# Patient Record
Sex: Female | Born: 1940
Health system: Southern US, Community
[De-identification: ages and names within clinical notes are randomized; demographics above are authoritative.]

## PROBLEM LIST (undated history)

## (undated) DIAGNOSIS — I071 Rheumatic tricuspid insufficiency: Secondary | ICD-10-CM

## (undated) DIAGNOSIS — E785 Hyperlipidemia, unspecified: Secondary | ICD-10-CM

## (undated) DIAGNOSIS — E039 Hypothyroidism, unspecified: Secondary | ICD-10-CM

## (undated) DIAGNOSIS — I340 Nonrheumatic mitral (valve) insufficiency: Secondary | ICD-10-CM

## (undated) DIAGNOSIS — I4819 Other persistent atrial fibrillation: Secondary | ICD-10-CM

## (undated) DIAGNOSIS — I351 Nonrheumatic aortic (valve) insufficiency: Secondary | ICD-10-CM

## (undated) DIAGNOSIS — Z9289 Personal history of other medical treatment: Secondary | ICD-10-CM

## (undated) DIAGNOSIS — I10 Essential (primary) hypertension: Secondary | ICD-10-CM

## (undated) DIAGNOSIS — E079 Disorder of thyroid, unspecified: Secondary | ICD-10-CM

## (undated) HISTORY — DX: Nonrheumatic aortic (valve) insufficiency: I35.1

## (undated) HISTORY — DX: Other persistent atrial fibrillation: I48.19

## (undated) HISTORY — DX: Essential (primary) hypertension: I10

## (undated) HISTORY — DX: Nonrheumatic mitral (valve) insufficiency: I34.0

## (undated) HISTORY — DX: Personal history of other medical treatment: Z92.89

## (undated) HISTORY — DX: Hypothyroidism, unspecified: E03.9

## (undated) HISTORY — PX: BREAST SURGERY: SHX581

## (undated) HISTORY — DX: Disorder of thyroid, unspecified: E07.9

## (undated) HISTORY — DX: Hyperlipidemia, unspecified: E78.5

## (undated) HISTORY — DX: Rheumatic tricuspid insufficiency: I07.1

---

## 1998-04-08 ENCOUNTER — Other Ambulatory Visit: Admission: RE | Admit: 1998-04-08 | Discharge: 1998-04-08 | Payer: Self-pay | Admitting: *Deleted

## 1999-04-14 ENCOUNTER — Other Ambulatory Visit: Admission: RE | Admit: 1999-04-14 | Discharge: 1999-04-14 | Payer: Self-pay | Admitting: *Deleted

## 2000-04-17 ENCOUNTER — Other Ambulatory Visit: Admission: RE | Admit: 2000-04-17 | Discharge: 2000-04-17 | Payer: Self-pay | Admitting: *Deleted

## 2001-04-17 ENCOUNTER — Other Ambulatory Visit: Admission: RE | Admit: 2001-04-17 | Discharge: 2001-04-17 | Payer: Self-pay | Admitting: *Deleted

## 2001-09-16 ENCOUNTER — Ambulatory Visit (HOSPITAL_COMMUNITY): Admission: RE | Admit: 2001-09-16 | Discharge: 2001-09-16 | Payer: Self-pay | Admitting: Family Medicine

## 2001-09-16 ENCOUNTER — Encounter: Payer: Self-pay | Admitting: Family Medicine

## 2001-10-10 DIAGNOSIS — E079 Disorder of thyroid, unspecified: Secondary | ICD-10-CM

## 2001-10-10 HISTORY — DX: Disorder of thyroid, unspecified: E07.9

## 2001-10-10 HISTORY — PX: THYROIDECTOMY: SHX17

## 2001-12-04 HISTORY — PX: CARDIAC CATHETERIZATION: SHX172

## 2002-03-24 ENCOUNTER — Inpatient Hospital Stay (HOSPITAL_COMMUNITY): Admission: AD | Admit: 2002-03-24 | Discharge: 2002-03-25 | Payer: Self-pay | Admitting: Family Medicine

## 2002-03-24 ENCOUNTER — Encounter: Payer: Self-pay | Admitting: Family Medicine

## 2002-11-09 ENCOUNTER — Emergency Department (HOSPITAL_COMMUNITY): Admission: EM | Admit: 2002-11-09 | Discharge: 2002-11-09 | Payer: Self-pay | Admitting: *Deleted

## 2003-04-29 ENCOUNTER — Encounter: Payer: Self-pay | Admitting: *Deleted

## 2003-04-29 ENCOUNTER — Ambulatory Visit (HOSPITAL_COMMUNITY): Admission: RE | Admit: 2003-04-29 | Discharge: 2003-04-29 | Payer: Self-pay | Admitting: *Deleted

## 2003-05-06 ENCOUNTER — Ambulatory Visit (HOSPITAL_COMMUNITY): Admission: RE | Admit: 2003-05-06 | Discharge: 2003-05-06 | Payer: Self-pay | Admitting: General Surgery

## 2003-11-23 ENCOUNTER — Ambulatory Visit (HOSPITAL_COMMUNITY): Admission: RE | Admit: 2003-11-23 | Discharge: 2003-11-23 | Payer: Self-pay | Admitting: *Deleted

## 2007-02-28 ENCOUNTER — Emergency Department (HOSPITAL_COMMUNITY): Admission: EM | Admit: 2007-02-28 | Discharge: 2007-03-01 | Payer: Self-pay | Admitting: Emergency Medicine

## 2007-03-27 ENCOUNTER — Ambulatory Visit (HOSPITAL_COMMUNITY): Admission: RE | Admit: 2007-03-27 | Discharge: 2007-03-27 | Payer: Self-pay | Admitting: Family Medicine

## 2007-05-13 ENCOUNTER — Emergency Department (HOSPITAL_COMMUNITY): Admission: EM | Admit: 2007-05-13 | Discharge: 2007-05-13 | Payer: Self-pay | Admitting: Emergency Medicine

## 2007-05-23 ENCOUNTER — Ambulatory Visit: Payer: Self-pay | Admitting: Orthopedic Surgery

## 2007-05-27 ENCOUNTER — Encounter (HOSPITAL_COMMUNITY): Admission: RE | Admit: 2007-05-27 | Discharge: 2007-06-26 | Payer: Self-pay | Admitting: Orthopedic Surgery

## 2007-06-27 ENCOUNTER — Ambulatory Visit: Payer: Self-pay | Admitting: Orthopedic Surgery

## 2009-03-08 ENCOUNTER — Ambulatory Visit (HOSPITAL_COMMUNITY): Admission: RE | Admit: 2009-03-08 | Discharge: 2009-03-08 | Payer: Self-pay | Admitting: Family Medicine

## 2009-09-17 ENCOUNTER — Ambulatory Visit (HOSPITAL_COMMUNITY): Admission: RE | Admit: 2009-09-17 | Discharge: 2009-09-17 | Payer: Self-pay | Admitting: Family Medicine

## 2009-09-17 ENCOUNTER — Encounter: Payer: Self-pay | Admitting: Family Medicine

## 2010-10-18 ENCOUNTER — Ambulatory Visit (HOSPITAL_COMMUNITY): Admission: RE | Admit: 2010-10-18 | Discharge: 2010-10-18 | Payer: Self-pay | Admitting: Family Medicine

## 2010-11-02 ENCOUNTER — Ambulatory Visit (HOSPITAL_COMMUNITY): Admission: RE | Admit: 2010-11-02 | Discharge: 2010-11-02 | Payer: Self-pay | Admitting: Family Medicine

## 2011-02-02 HISTORY — PX: TRANSTHORACIC ECHOCARDIOGRAM: SHX275

## 2011-04-21 NOTE — H&P (Signed)
   NAME:  Kathleen Mcconnell, Kathleen Mcconnell NO.:  [id number]   MEDICAL RECORD NO.:  [id number]                  PATIENT TYPE:   LOCATION:                                       FACILITY:   PHYSICIAN:  Dalia Heading, M.D.               DATE OF BIRTH:  [birth year]   DATE OF ADMISSION:  DATE OF DISCHARGE:                                HISTORY & PHYSICAL   CHIEF COMPLAINT:  Need for screening colonoscopy.   HISTORY OF PRESENT ILLNESS:  The patient is a 70 year old white female who  is referred for evaluation and treatment for a screening colonoscopy.  She  denies any abdominal complaints.  No hematochezia has been noted.  She has  never had a colonoscopy.   There is no immediate family history of colon cancer.   PAST MEDICAL HISTORY:  Past medical history includes hypertension,  hypothyroidism.   PAST SURGICAL HISTORY:  Thyroidectomy in the past.   CURRENT MEDICATIONS:  Levoxyl, Avapro, Lopressor, Fosamax.   ALLERGIES:  Aspirin.   REVIEW OF SYSTEMS:  Noncontributory.   PHYSICAL EXAMINATION:  GENERAL:  On physical examination, the patient is a  well-developed well-nourished white female in no acute distress.  VITAL SIGNS:  She is afebrile and vital signs are stable.  LUNGS:  Clear to auscultation with equal breath sounds bilaterally.  HEART:  Heart examination reveals a regular rate and rhythm without S3, S4,  or murmurs.  ABDOMEN:  The abdomen is benign.  RECTAL:  Examination is deferred to the procedure.   IMPRESSION:  Need for screening colonoscopy.    PLAN:  The patient is scheduled for a colonoscopy on May 06, 2003.  The  risks and benefits of the procedure including bleeding and perforation were  fully explained to the patient, who gave informed consent.                                               Dalia Heading, M.D.    MAJ/MEDQ  D:  04/30/2003  T:  04/30/2003  Job:  [id number]   cc:   Langley Gauss, M.D.  [address], [address]  [place]  [place] [address]  Fax: [phone]   Corrie Mckusick, M.D.  [phone]  [place]  [place] [address]  Fax: [phone]

## 2011-04-21 NOTE — Discharge Summary (Signed)
[facility]  Patient:    Kathleen Mcconnell, Kathleen Mcconnell Visit Number: [id number] MRN: [id number]          Service Type: MED Location: 2A A223 01 Attending Physician:  Darlin Priestly Dictated by:   Colette Ribas, M.D. Admit Date:  03/24/2002 Discharge Date: 03/25/2002                             Discharge Summary  ADMITTING DIAGNOSES: 1. Palpitations. 2. New onset of atrial fibrillation.  DISCHARGE DIAGNOSIS:  Resolved atrial fibrillation.  HISTORY OF PRESENT ILLNESS AND PAST MEDICAL HISTORY:  Please see admission H&P.  HOSPITAL COURSE:  A 69 year old female with hypertension and hypothyroidism who presented with palpitations and atrial flutter in the office. She was admitted for conversion which she self converted. She was admitted for also rule out of myocardial damage, echocardiogram, thyroid panel. Dr. Domingo Sep was consulted.  The day after admission, the patient felt quite well with no palpitation and no chest pain. CPKs, troponins and MBs were all negative x3. T4 10.1, TSH 1.34, T3 168. Dr. Domingo Sep felt like the patient should be on Coumadin due to the fact that she has been most likely back and forth into atrial fibrillation.  See progress note from day of discharge for physical, and please see Dr. Ledora Bottcher consult note for further details.  DISCHARGE MEDICATIONS: 1. Coumadin 5 mg daily. 2. Lopressor 12.5 p.o. b.i.d. 3. Avapro 150 mg daily. 4. Levoxyl 88 mcg daily.  FOLLOWUP:  She was to follow with [facility] with myself on Friday April 25th, and at that time we will have her INR checked. Will follow up sooner if any problems arise. Has a followup with Dr. Domingo Sep on April 02, 2002. Will have Cardiolite stress test as an outpatient. Dictated by:   Colette Ribas, M.D. Attending Physician:  Darlin Priestly DD:  04/15/02 TD:  04/16/02 Job: [id number]

## 2011-04-21 NOTE — H&P (Signed)
[facility]  Patient:    Kathleen Mcconnell, Kathleen Mcconnell Visit Number: [id number] MRN: [id number]          Service Type: MED Location: 2A A223 01 Attending Physician:  Darlin Priestly Dictated by:   Colette Ribas, M.D. Admit Date:  03/24/2002                           History and Physical  ADMISSION DIAGNOSIS:  Palpitations, new-onset atrial flutter.  HISTORY OF PRESENT ILLNESS:  The patient is a 70 year old female who presented to the office with 45 minutes of heart palpitations and "flutter."  This had happened prior, but she had never been able to come to the physicians office when this happened.  In the past this lasted anywhere from 5-10 minutes.  At this point it seems to be one of the longest episodes.  In the office she was quite stable.  Blood pressure and vital signs were all stable.  ECG showed atrial flutter with rate of around 110.  She had no associated chest pain, just some mild shortness of breath.  No nausea, vomiting, or diaphoresis.  She had no radiation of any pain either.  No other real symptomatology.  Recent thyroidectomy and some recent changes in her thyroid medications and is, basically, due for a thyroid panel.  No other symptomatology.  GI, respiratory, and GU review of systems all negative.  PAST MEDICAL HISTORY: 1. Recent thyroidectomy for thyroid mass and hypothyroidism. 2. Seasonal allergies. 3. Hypertension.  PAST SURGICAL HISTORY:  As stated above.  MEDICATIONS: 1. Levoxyl 88 mcg q.d. 2. Avapro 150 q.d.  ALLERGIES:  CLARITIN.  SOCIAL HISTORY:  Does not drink nor smoke.  No substance abuse.  FAMILY HISTORY:  Significant for ovarian carcinoma in grandmother and some coronary disease in her father.  Otherwise, no real early coronary disease or CVAs.  No diabetes.  PHYSICAL EXAMINATION:  VITAL SIGNS:  Afebrile.  Pulse in the office was 110.  Respirations 18, blood pressure 120/85.  Weight 115 pounds.  GENERAL:   Pleasant, talkative female really in no acute distress.  HEENT:  Normocephalic, atraumatic.  Pupils equal and reactive.  Extraocular muscles intact.  Nasopharynx and oropharynx clear.  NECK:  No JVD.  LUNGS:  Clear to auscultation bilaterally.  CARDIOVASCULAR:  Really appears, by auscultation, sounds almost regular rate and rhythm with ectopy.  If it is irregular, it is a quite regular irregular. There are normal S1, S2.  No murmurs auscultated.  ABDOMEN:  Bowel sounds positive.  Soft, nontender, nondistended.  EXTREMITIES:  No cyanosis, clubbing, or erythema.  No edema.  ASSESSMENT:  The patient is a 70 year old female with hypertension and hypothyroidism who presents with palpitations and atrial flutter in the office.  PLAN: 1. Admit for rule out of any myocardial damage. 2. Echocardiogram. 3. Check thyroid panel. 4. Hold off on anticoagulation, as we know exactly when it began, for right    now and see if we can convert her with digoxin. 5. Consult Dr. Domingo Sep, [facility]. 6. Will continue to follow closely. Dictated by:   Colette Ribas, M.D. Attending Physician:  Darlin Priestly DD:  03/25/02 TD:  03/25/02 Job: [id number]

## 2011-04-21 NOTE — Procedures (Signed)
[facility]  Patient:    Kathleen Mcconnell, Kathleen Mcconnell Visit Number: [id number] MRN: [id number]          Service Type: MED Location: 2A A223 01 Attending Physician:  Darlin Priestly Dictated by:   Kari Baars, M.D. Proc. Date: 03/24/02 Admit Date:  03/24/2002                            EKG Interpretations  TIME:  11:09  INTERPRETATION:  The rhythm is a sinus rhythm with a rate of about 90. P-R interval is short, which could be seen with something like Wolff-Parkinson-White syndrome.  There is no definitive slurring of the first part of the QRS upstroke, so clinical correlation of this is suggested.  There is right atrial enlargement.  QRS voltage is generally high.  There is ST depression most marked inferiorly, which is nonspecific, but may be related to the high QRS voltage and clinical correlation is again suggested.  IMPRESSION:  Abnormal electrocardiogram. Dictated by:   Kari Baars, M.D. Attending Physician:  Darlin Priestly DD:  03/24/02 TD:  03/25/02 Job: [id number]

## 2011-04-21 NOTE — Cardiovascular Report (Signed)
NAME:  Kathleen Mcconnell, Kathleen Mcconnell                          ACCOUNT NO.:  [id number]   MEDICAL RECORD NO.:  [id number]                   PATIENT TYPE:  AMB   LOCATION:  ENDO                                 FACILITY:  [facility]   PHYSICIAN:  Dani Gobble, MD                    DATE OF BIRTH:  02/08/1941   DATE OF PROCEDURE:  11/23/2003  DATE OF DISCHARGE:                              CARDIAC CATHETERIZATION   PROCEDURE:  Transesophageal echocardiogram with elective cardioversion.   INDICATION:  Atrial fibrillation.   COMPLICATIONS:  None immediate.   On arrival to the outpatient center, labs were checked and INR was found to  subtherapeutic at 1.7.  She was started on IV heparin 4000 unit bolus  followed by 1000 units per hour.  The patient was sedated and intubated  without difficulty with Versed 4 mg IV and fentanyl 100 mcg IV.   RESULTS:  1. Left atrium-mild to moderate left atrial enlargement with positive     spontaneous smoke noted. No clots or masses are appreciated.  2. Left atrial appendage is normal in size without clots or masses noted.     Exit velocities are decreased and are less than or equal to 20 cm per     second.  There is smoke noted in the left atrial appendage as well.  No     obvious clots or masses noted.  3. Right atrium reveals mild to moderate right atrial enlargement. No clots     or masses are appreciated.  4. Interatrial septum appears to be intact with negative contrast/bubble     study.  5. Left ventricular is normal in size with normal left ventricular wall     thickness.  6. Left ventricular systolic function is normal and no regional wall motion     abnormalities are noted.  7. The right ventricle normal in size with normal right ventricular systolic     function.  8. Aortic valve is trileaflet and pliable with normal leaflet excursion and     trivial eccentric aortic insufficiency.  9. The mitral valve is minimally myxomatous particularly at the distal  portion of the anterior leaflet with two mild jets of mitral     regurgitation noted.  10.      Tricuspid valve appears structurally normal with moderate to severe     (more on the moderate side) tricuspid regurgitation with eccentric jets     skirting along the interatrial septum.  11.      The pulmonic valve was not well seen, but appears grossly normal     without significant pulmonic insufficiency noted.  12.      The PA size is normal.  13.      The aorta is normal in size with no significant atheroma.   As no clots or masses were appreciated during this study, anesthesia was  called to manage anesthesia in  the airway for this patient during elective  cardioversion.  The patient was attached to the Zoll defibrillator pads.  2.5 mg of IV Lopressor was given prior to cardioversion as she did not take  her beta blocker this morning. This was repeated x1 after the procedure as  well.  Initial shock of 50 joules with the Zoll biphasic defibrillator  failed to convert to sinus rhythm, but repeat stimulation at 75 joules was  successful in converting to sinus rhythm.  She will be given an  additional 50 mg of p.o. Lopressor once fully awake and safe to do so.  We  will have her take Coumadin at 5 mg today, tomorrow and Wednesday and  recheck an INR on Wednesday.  The patient was awake and alert status post  procedure without obvious complications and I am enroute to speak to the  family from here.                                               Dani Gobble, MD    AB/MEDQ  D:  11/23/2003  T:  11/23/2003  Job:  [id number]   cc:   Corrie Mckusick, M.D.  [address]  [place]  [place] [address]  Fax: [phone]

## 2011-08-30 ENCOUNTER — Other Ambulatory Visit (HOSPITAL_COMMUNITY): Payer: Self-pay | Admitting: Family Medicine

## 2011-08-30 DIAGNOSIS — Z139 Encounter for screening, unspecified: Secondary | ICD-10-CM

## 2011-09-19 ENCOUNTER — Ambulatory Visit (HOSPITAL_COMMUNITY)
Admission: RE | Admit: 2011-09-19 | Discharge: 2011-09-19 | Disposition: A | Discharge status: Home / Self Care | Payer: Medicare Other | Source: Ambulatory Visit | Attending: Family Medicine | Admitting: Family Medicine

## 2011-09-19 DIAGNOSIS — Z139 Encounter for screening, unspecified: Secondary | ICD-10-CM

## 2011-09-19 DIAGNOSIS — M818 Other osteoporosis without current pathological fracture: Secondary | ICD-10-CM | POA: Insufficient documentation

## 2011-09-19 DIAGNOSIS — Z780 Asymptomatic menopausal state: Secondary | ICD-10-CM | POA: Insufficient documentation

## 2011-12-12 DIAGNOSIS — I4891 Unspecified atrial fibrillation: Secondary | ICD-10-CM | POA: Diagnosis not present

## 2012-01-05 ENCOUNTER — Other Ambulatory Visit (HOSPITAL_COMMUNITY): Payer: Self-pay | Admitting: Family Medicine

## 2012-01-05 DIAGNOSIS — Z139 Encounter for screening, unspecified: Secondary | ICD-10-CM

## 2012-01-11 DIAGNOSIS — E782 Mixed hyperlipidemia: Secondary | ICD-10-CM | POA: Diagnosis not present

## 2012-01-11 DIAGNOSIS — Q249 Congenital malformation of heart, unspecified: Secondary | ICD-10-CM | POA: Diagnosis not present

## 2012-01-11 DIAGNOSIS — I4891 Unspecified atrial fibrillation: Secondary | ICD-10-CM | POA: Diagnosis not present

## 2012-01-11 DIAGNOSIS — I10 Essential (primary) hypertension: Secondary | ICD-10-CM | POA: Diagnosis not present

## 2012-01-18 DIAGNOSIS — E782 Mixed hyperlipidemia: Secondary | ICD-10-CM | POA: Diagnosis not present

## 2012-01-25 DIAGNOSIS — I4891 Unspecified atrial fibrillation: Secondary | ICD-10-CM | POA: Diagnosis not present

## 2012-02-05 ENCOUNTER — Ambulatory Visit (HOSPITAL_COMMUNITY)
Admission: RE | Admit: 2012-02-05 | Discharge: 2012-02-05 | Disposition: A | Discharge status: Home / Self Care | Payer: Medicare Other | Source: Ambulatory Visit | Attending: Family Medicine | Admitting: Family Medicine

## 2012-02-05 DIAGNOSIS — Z978 Presence of other specified devices: Secondary | ICD-10-CM | POA: Diagnosis not present

## 2012-02-05 DIAGNOSIS — Z139 Encounter for screening, unspecified: Secondary | ICD-10-CM

## 2012-02-05 DIAGNOSIS — Z1231 Encounter for screening mammogram for malignant neoplasm of breast: Secondary | ICD-10-CM | POA: Insufficient documentation

## 2012-02-22 DIAGNOSIS — I4891 Unspecified atrial fibrillation: Secondary | ICD-10-CM | POA: Diagnosis not present

## 2012-03-04 DIAGNOSIS — Z9289 Personal history of other medical treatment: Secondary | ICD-10-CM

## 2012-03-04 HISTORY — DX: Personal history of other medical treatment: Z92.89

## 2012-03-05 DIAGNOSIS — L821 Other seborrheic keratosis: Secondary | ICD-10-CM | POA: Diagnosis not present

## 2012-03-05 DIAGNOSIS — D239 Other benign neoplasm of skin, unspecified: Secondary | ICD-10-CM | POA: Diagnosis not present

## 2012-03-15 DIAGNOSIS — E782 Mixed hyperlipidemia: Secondary | ICD-10-CM | POA: Diagnosis not present

## 2012-03-15 DIAGNOSIS — I4891 Unspecified atrial fibrillation: Secondary | ICD-10-CM | POA: Diagnosis not present

## 2012-03-15 DIAGNOSIS — I10 Essential (primary) hypertension: Secondary | ICD-10-CM | POA: Diagnosis not present

## 2012-03-15 DIAGNOSIS — R9431 Abnormal electrocardiogram [ECG] [EKG]: Secondary | ICD-10-CM | POA: Diagnosis not present

## 2012-03-21 DIAGNOSIS — I4891 Unspecified atrial fibrillation: Secondary | ICD-10-CM | POA: Diagnosis not present

## 2012-03-25 DIAGNOSIS — Z961 Presence of intraocular lens: Secondary | ICD-10-CM | POA: Diagnosis not present

## 2012-04-18 DIAGNOSIS — I4891 Unspecified atrial fibrillation: Secondary | ICD-10-CM | POA: Diagnosis not present

## 2012-05-15 DIAGNOSIS — I4891 Unspecified atrial fibrillation: Secondary | ICD-10-CM | POA: Diagnosis not present

## 2012-06-03 ENCOUNTER — Telehealth: Payer: Self-pay | Admitting: *Deleted

## 2012-06-03 NOTE — Telephone Encounter (Signed)
LMTCB concerning refill request for Crestor. She has not been seen by Dr. Mariah Milling needs to set up appointment.

## 2012-06-13 DIAGNOSIS — I4891 Unspecified atrial fibrillation: Secondary | ICD-10-CM | POA: Diagnosis not present

## 2012-07-16 DIAGNOSIS — I4891 Unspecified atrial fibrillation: Secondary | ICD-10-CM | POA: Diagnosis not present

## 2012-08-13 DIAGNOSIS — I4891 Unspecified atrial fibrillation: Secondary | ICD-10-CM | POA: Diagnosis not present

## 2012-09-04 DIAGNOSIS — Z23 Encounter for immunization: Secondary | ICD-10-CM | POA: Diagnosis not present

## 2012-09-10 DIAGNOSIS — I4891 Unspecified atrial fibrillation: Secondary | ICD-10-CM | POA: Diagnosis not present

## 2012-10-08 DIAGNOSIS — I4891 Unspecified atrial fibrillation: Secondary | ICD-10-CM | POA: Diagnosis not present

## 2012-11-05 DIAGNOSIS — I4891 Unspecified atrial fibrillation: Secondary | ICD-10-CM | POA: Diagnosis not present

## 2012-11-28 DIAGNOSIS — I4891 Unspecified atrial fibrillation: Secondary | ICD-10-CM | POA: Diagnosis not present

## 2012-11-28 DIAGNOSIS — E039 Hypothyroidism, unspecified: Secondary | ICD-10-CM | POA: Diagnosis not present

## 2012-12-06 ENCOUNTER — Encounter: Payer: Self-pay | Admitting: Gastroenterology

## 2012-12-09 DIAGNOSIS — I4891 Unspecified atrial fibrillation: Secondary | ICD-10-CM | POA: Diagnosis not present

## 2012-12-19 ENCOUNTER — Encounter: Payer: Self-pay | Admitting: *Deleted

## 2012-12-20 ENCOUNTER — Encounter: Payer: Self-pay | Admitting: Gastroenterology

## 2012-12-20 ENCOUNTER — Ambulatory Visit (INDEPENDENT_AMBULATORY_CARE_PROVIDER_SITE_OTHER): Payer: Medicare Other | Admitting: Gastroenterology

## 2012-12-20 VITALS — BP 110/62 | HR 76 | Ht 62.75 in | Wt 120.4 lb

## 2012-12-20 DIAGNOSIS — Z1211 Encounter for screening for malignant neoplasm of colon: Secondary | ICD-10-CM | POA: Diagnosis not present

## 2012-12-20 MED ORDER — MOVIPREP 100 G PO SOLR: 1.0000 | Freq: Once | ORAL | Status: AC

## 2012-12-20 NOTE — Progress Notes (Signed)
12/20/2012 Kathleen Mcconnell [id number] 06-13-1941   HISTORY OF PRESENT ILLNESS:  Patient is a pleasant 72 year old female who presents to our office today at the request of her PCP to schedule a screening colonoscopy.  She had a colonoscopy 10 years ago in [place], [place] at which time she did not have any polyps, according to her report.  She does not have any complaints at this time.  She is on coumadin for several years due to atrial fibrillation (cannot be switched to ASA because she is allergic to it).  Recent CBC, CMP, and TSH were WNL's.   Past Medical History  Diagnosis Date  . Hypertension   . Arrhythmia     History of  . Atrial fibrillation   . Thyroid mass 10/10/2001    removed   Past Surgical History  Procedure Date  . Thyroidectomy 10/10/2001    reports that she has never smoked. She has never used smokeless tobacco. She reports that she does not drink alcohol or use illicit drugs. family history includes Heart disease in her brother, father, and sister. Allergies  Allergen Reactions  . Aspirin       Outpatient Encounter Prescriptions as of 12/20/2012  Medication Sig Dispense Refill  . alendronate (FOSAMAX) 70 MG tablet Take 70 mg by mouth every 7 (seven) days. Take with a full glass of water on an empty stomach.      . diltiazem (CARDIZEM CD) 300 MG 24 hr capsule Take 300 mg by mouth daily.      Marland Kitchen levothyroxine (SYNTHROID, LEVOTHROID) 88 MCG tablet Take 88 mcg by mouth daily.      Marland Kitchen lisinopril (PRINIVIL,ZESTRIL) 20 MG tablet Take 20 mg by mouth 2 (two) times daily.       Marland Kitchen losartan (COZAAR) 100 MG tablet Take 100 mg by mouth daily. Take 1 tab      . rosuvastatin (CRESTOR) 5 MG tablet Take 5 mg by mouth daily.      . sotalol (BETAPACE) 80 MG tablet Take 80 mg by mouth 2 (two) times daily.      Marland Kitchen warfarin (COUMADIN) 6 MG tablet Take 6 mg by mouth daily.      . [DISCONTINUED] warfarin (COUMADIN) 6 MG tablet Take 6 mg by mouth as directed.       . [DISCONTINUED] irbesartan  (AVAPRO) 300 MG tablet Take 300 mg by mouth at bedtime.         REVIEW OF SYSTEMS  : All other systems reviewed and negative except where noted in the History of Present Illness.   PHYSICAL EXAM: BP 110/62  Pulse 76  Ht 5' 2.75" (1.594 m)  Wt 120 lb 7 oz (54.63 kg)  BMI 21.50 kg/m2 General: Well developed white female in no acute distress Head: Normocephalic and atraumatic Eyes:  sclerae anicteric,conjunctive pink. Ears: Normal auditory acuity Lungs: Clear throughout to auscultation Heart: Irregularly irregular; no M/R/G. Abdomen: Soft, nontender, non distended. No masses or hepatomegaly noted. Normal bowel sounds Rectal: Deferred; will be done at the time of colonoscopy. Musculoskeletal: Symmetrical with no gross deformities  Skin: No lesions on visible extremities Extremities: No edema  Neurological: Alert oriented x 4, grossly nonfocal Psychological:  Alert and cooperative. Normal mood and affect  ASSESSMENT AND PLAN: -Screening colonoscopy:  No complaints.  On coumadin.  Will schedule colonoscopy with Dr. Juanda Chance at [facility] and contact her cardiologist for ok to stop coumadin.  Risks and benefits were discussed with the patient and her daughter, and she agrees  to proceed.

## 2012-12-20 NOTE — Progress Notes (Signed)
Reviewed and agree.

## 2012-12-20 NOTE — Patient Instructions (Addendum)
We sent a prescription for the Moviprep to [facility], [place].  You have been scheduled for a colonoscopy with propofol. Please follow written instructions given to you at your visit today.  Please pick up your prep kit at the pharmacy within the next 1-3 days. If you use inhalers (even only as needed) or a CPAP machine, please bring them with you on the day of your procedure.  We will call you once we hear from Dr. K Italy [facility] regarding the coumadin medication.

## 2012-12-24 ENCOUNTER — Telehealth: Payer: Self-pay | Admitting: *Deleted

## 2012-12-24 NOTE — Telephone Encounter (Signed)
Dr. Italy Hilty faxed to [place] his response for Coumadin clearance for her colonoscopy scheduled on 01-24-2013.  The patient can stop the coumadin on 2-16- 2014 . She can resume it on 01-25-2013.  ( the day after the procedure).  The patient understood the instructions and thanked me for calling.

## 2013-01-09 ENCOUNTER — Other Ambulatory Visit: Payer: Self-pay | Admitting: Internal Medicine

## 2013-01-09 DIAGNOSIS — I4891 Unspecified atrial fibrillation: Secondary | ICD-10-CM | POA: Diagnosis not present

## 2013-01-09 DIAGNOSIS — E782 Mixed hyperlipidemia: Secondary | ICD-10-CM | POA: Diagnosis not present

## 2013-01-09 DIAGNOSIS — I10 Essential (primary) hypertension: Secondary | ICD-10-CM | POA: Diagnosis not present

## 2013-01-13 ENCOUNTER — Encounter (HOSPITAL_COMMUNITY): Payer: Self-pay | Admitting: Pharmacy Technician

## 2013-01-14 ENCOUNTER — Ambulatory Visit (HOSPITAL_COMMUNITY)
Admission: RE | Admit: 2013-01-14 | Discharge: 2013-01-14 | Disposition: A | Discharge status: Home / Self Care | Payer: Medicare Other | Source: Ambulatory Visit | Attending: Internal Medicine | Admitting: Internal Medicine

## 2013-01-14 ENCOUNTER — Other Ambulatory Visit (HOSPITAL_COMMUNITY): Payer: Self-pay | Admitting: Internal Medicine

## 2013-01-14 DIAGNOSIS — E782 Mixed hyperlipidemia: Secondary | ICD-10-CM | POA: Diagnosis not present

## 2013-01-14 DIAGNOSIS — Z01811 Encounter for preprocedural respiratory examination: Secondary | ICD-10-CM

## 2013-01-14 DIAGNOSIS — Z79899 Other long term (current) drug therapy: Secondary | ICD-10-CM | POA: Diagnosis not present

## 2013-01-14 DIAGNOSIS — D65 Disseminated intravascular coagulation [defibrination syndrome]: Secondary | ICD-10-CM | POA: Diagnosis not present

## 2013-01-14 DIAGNOSIS — I10 Essential (primary) hypertension: Secondary | ICD-10-CM | POA: Diagnosis not present

## 2013-01-14 DIAGNOSIS — R0602 Shortness of breath: Secondary | ICD-10-CM | POA: Diagnosis not present

## 2013-01-20 ENCOUNTER — Encounter (HOSPITAL_COMMUNITY)
Admission: RE | Disposition: A | Discharge status: Home / Self Care | Payer: Self-pay | Source: Ambulatory Visit | Attending: Internal Medicine

## 2013-01-20 ENCOUNTER — Encounter (HOSPITAL_COMMUNITY): Payer: Self-pay | Admitting: Gastroenterology

## 2013-01-20 ENCOUNTER — Ambulatory Visit (HOSPITAL_COMMUNITY)
Admission: RE | Admit: 2013-01-20 | Discharge: 2013-01-20 | Disposition: A | Discharge status: Home / Self Care | Payer: Medicare Other | Source: Ambulatory Visit | Attending: Internal Medicine | Admitting: Internal Medicine

## 2013-01-20 ENCOUNTER — Ambulatory Visit (HOSPITAL_COMMUNITY): Payer: Medicare Other | Admitting: Anesthesiology

## 2013-01-20 ENCOUNTER — Encounter (HOSPITAL_COMMUNITY): Payer: Self-pay | Admitting: Anesthesiology

## 2013-01-20 DIAGNOSIS — I4891 Unspecified atrial fibrillation: Secondary | ICD-10-CM | POA: Insufficient documentation

## 2013-01-20 HISTORY — PX: CARDIOVERSION: SHX1299

## 2013-01-20 SURGERY — CARDIOVERSION
Anesthesia: Monitor Anesthesia Care

## 2013-01-20 MED ORDER — SODIUM CHLORIDE 0.9 % IV SOLN
INTRAVENOUS | Status: DC
  Administered 2013-01-20: 500 mL via INTRAVENOUS

## 2013-01-20 MED ORDER — SODIUM CHLORIDE 0.9 % IV SOLN
INTRAVENOUS | Status: DC | PRN
  Administered 2013-01-20: 13:00:00 via INTRAVENOUS

## 2013-01-20 MED ORDER — PROPOFOL 10 MG/ML IV BOLUS
INTRAVENOUS | Status: DC | PRN
  Administered 2013-01-20: 80 mg via INTRAVENOUS

## 2013-01-20 NOTE — H&P (Signed)
[facility]          INTERVAL PROCEDURE H&P   History and Physical Interval Note:  01/20/2013 12:13 PM  Kathleen Mcconnell has presented today for their planned procedure. The various methods of treatment have been discussed with the patient and family. After consideration of risks, benefits and other options for treatment, the patient has consented to the procedure.  The patients' outpatient history has been reviewed, patient examined, and no change in status from most recent office note within the past 30 days. I have reviewed the patients' chart and labs and will proceed as planned. Questions were answered to the patient's satisfaction.   Chrystie Nose, MD, [facility] Attending Cardiologist [facility]  Kathleen Mcconnell C 01/20/2013, 12:13 PM

## 2013-01-20 NOTE — Anesthesia Preprocedure Evaluation (Addendum)
Anesthesia Evaluation  Patient identified by MRN, date of birth, ID band Patient awake    Reviewed: Allergy & Precautions, H&P , NPO status , Patient's Chart, lab work & pertinent test results, reviewed documented beta blocker date and time   History of Anesthesia Complications Negative for: history of anesthetic complications  Airway Mallampati: II TM Distance: >3 FB Neck ROM: Full    Dental  (+) Teeth Intact and Dental Advisory Given   Pulmonary neg pulmonary ROS,          Cardiovascular hypertension, Pt. on medications + dysrhythmias Atrial Fibrillation Rhythm:Irregular     Neuro/Psych negative neurological ROS     GI/Hepatic negative GI ROS, Neg liver ROS,   Endo/Other  Hypothyroidism   Renal/GU negative Renal ROS     Musculoskeletal   Abdominal   Peds  Hematology   Anesthesia Other Findings   Reproductive/Obstetrics                           Anesthesia Physical Anesthesia Plan  ASA: III  Anesthesia Plan: MAC   Post-op Pain Management:    Induction: Intravenous  Airway Management Planned: Mask  Additional Equipment:   Intra-op Plan:   Post-operative Plan:   Informed Consent: I have reviewed the patients History and Physical, chart, labs and discussed the procedure including the risks, benefits and alternatives for the proposed anesthesia with the patient or authorized representative who has indicated his/her understanding and acceptance.   Dental advisory given  Plan Discussed with: Anesthesiologist and Surgeon  Anesthesia Plan Comments:        Anesthesia Quick Evaluation

## 2013-01-20 NOTE — Anesthesia Postprocedure Evaluation (Signed)
  Anesthesia Post-op Note  Patient: Kathleen Mcconnell  Procedure(s) Performed: Procedure(s): CARDIOVERSION (N/A)  Patient Location: PACU  Anesthesia Type:MAC  Level of Consciousness: awake, alert  and oriented  Airway and Oxygen Therapy: Patient Spontanous Breathing and Patient connected to nasal cannula oxygen  Post-op Pain: none  Post-op Assessment: Post-op Vital signs reviewed, Patient's Cardiovascular Status Stable, Respiratory Function Stable, Patent Airway, No signs of Nausea or vomiting and Pain level controlled  Post-op Vital Signs: Reviewed and stable  Complications: No apparent anesthesia complications

## 2013-01-20 NOTE — Discharge Instructions (Addendum)
Electrical Cardioversion  Cardioversion is the delivery of a jolt of electricity to change the rhythm of the heart. Sticky patches or metal paddles are placed on the chest to deliver the electricity from a special device. This is done to restore a normal rhythm. A rhythm that is too fast or not regular keeps the heart from pumping well. Compared to medicines used to change an abnormal rhythm, cardioversion is faster and works better. It is also unpleasant and may dislodge blood clots from the heart. WHEN WOULD THIS BE DONE?  In an emergency:  There is low or no blood pressure as a result of the heart rhythm.  Normal rhythm must be restored as fast as possible to protect the brain and heart from further damage.  It may save a life.  For less serious heart rhythms, such as atrial fibrillation or flutter, in which:  The heart is beating too fast or is not regular.  The heart is still able to pump enough blood, but not as well as it should.  Medicine to change the rhythm has not worked.  It is safe to wait in order to allow time for preparation. LET YOUR CAREGIVER KNOW ABOUT:   Every medicine you are taking. It is very important to do this! Know when to take or stop taking any of them.  Any time in the past that you have felt your heart was not beating normally. RISKS AND COMPLICATIONS   Clots may form in the chambers of the heart if it is beating too fast. These clots may be dislodged during the procedure and travel to other parts of the body.  There is risk of a stroke during and after the procedure if a clot moves. Blood thinners lower this risk.  You may have a special test of your heart (TEE) to make sure there are no clots in your heart. BEFORE THE PROCEDURE   You may have some tests to see how well your heart is working.  You may start taking blood thinners so your blood does not clot as easily.  Other drugs may be given to help your heart work better. PROCEDURE  (SCHEDULED)  The procedure is typically done in a hospital by a heart doctor (cardiologist).  You will be told when and where to go.  You may be given some medicine through an intravenous (IV) access to reduce discomfort and make you sleepy before the procedure.  Your whole body may move when the shock is delivered. Your chest may feel sore.  You may be able to go home after a few hours. Your heart rhythm will be watched to make sure it does not change. HOME CARE INSTRUCTIONS   Only take medicine as directed by your caregiver. Be sure you understand how and when to take your medicine.  Learn how to feel your pulse and check it often.  Limit your activity for 48 hours.  Avoid caffeine and other stimulants as directed. SEEK MEDICAL CARE IF:   You feel like your heart is beating too fast or your pulse is not regular.  You have any questions about your medicines.  You have bleeding that will not stop. SEEK IMMEDIATE MEDICAL CARE IF:   You are dizzy or feel faint.  It is hard to breathe or you feel short of breath.  There is a change in discomfort in your chest.  Your speech is slurred or you have trouble moving your arm or leg on one side.  You get  a muscle cramp.  Your fingers or toes turn cold or blue. MAKE SURE YOU:   Understand these instructions.  Will watch your condition.  Will get help right away if you are not doing well or get worse. Document Released: 11/10/2002 Document Revised: 02/12/2012 Document Reviewed: 03/11/2008 [facility] Patient Information 2013 [place], [place].  Monitored Anesthesia Care (MAC)   MAC stands for monitored anesthesia care. MAC usually means a tube is not put in your trachea (windpipe). MAC may also be called moderate sedation. MAC usually involves giving intravenous anesthetic drugs, oxygen, watching vital signs and standard patient monitoring procedures similar to those used during a general anesthetic. MAC can be done without going  to the operating room. MAC is typically used for small procedures that cannot be done with only local anesthesia. MAC usually means lower doses of anesthetic drugs. The recovery period tends to be shorter. The drugs used cause a lower level of awareness. This means you are partially awake and your reflexes are intact. You may hear what is being said and feel some pressure, but should not feel pain. The drugs used may affect your ability to remember the procedure. If you have depressed consciousness and lose some protective reflexes, this is called deep sedation. If you become unconscious and fall completely asleep, this is general anesthesia. In both deep sedation and general anesthesia, the caregivers must make sure that your airway remains open. During MAC, the sedation-trained caregivers will:  Give medications which may include:  Sedatives.  Analgesics.  Hypnotics.  Other medications which are needed to keep you comfortable, safe and secure.  Give local anesthetic to numb the procedural site.  Monitor your level of consciousness.  Monitor your blood pressure.  Monitor your heart rate and rhythm.  Monitor your respirations and oxygen levels.  Monitor your airway.  Monitor your level of pain.  Evaluate and treat problems which may occur. Document Released: 08/16/2005 Document Revised: 02/12/2012 Document Reviewed: 10/19/2009 [facility] Patient Information 2013 [place], [place].

## 2013-01-20 NOTE — Transfer of Care (Signed)
Immediate Anesthesia Transfer of Care Note  Patient: Kathleen Mcconnell  Procedure(s) Performed: Procedure(s): CARDIOVERSION (N/A)  Patient Location: PACU  Anesthesia Type:MAC  Level of Consciousness: awake, alert  and oriented  Airway & Oxygen Therapy: Patient Spontanous Breathing and Patient connected to nasal cannula oxygen  Post-op Assessment: Report given to PACU RN and Post -op Vital signs reviewed and stable  Post vital signs: Reviewed and stable  Complications: No apparent anesthesia complications

## 2013-01-20 NOTE — CV Procedure (Signed)
[facility]  CARDIOVERSION NOTE   Procedure: Electrical Cardioversion Indications:  Atrial Fibrillation  Procedure Details:  Consent: Risks of procedure as well as the alternatives and risks of each were explained to the (patient/caregiver).  Consent for procedure obtained.  Time Out: Verified patient identification, verified procedure, site/side was marked, verified correct patient position, special equipment/implants available, medications/allergies/relevent history reviewed, required imaging and test results available.  Performed  Patient placed on cardiac monitor, pulse oximetry, supplemental oxygen as necessary.  Sedation given: per anesthesia, see their note Pacer pads placed anterior and posterior chest.  Cardioverted 1 time(s).  Cardioverted at 150J biphasic.  Impression: Findings: Post procedure EKG shows: NSR Complications: None Patient did tolerate procedure well.  Continue current medications, including warfarin at current dose and sotalol 120 mg BID.  Discharge home once awake and alert.  Time Spent Directly with the Patient:  30 minutes   Chrystie Nose, MD, [facility] Attending Cardiologist [facility]  Juanelle Trueheart C 01/20/2013, 12:52 PM

## 2013-01-20 NOTE — Preoperative (Signed)
Beta Blockers   Reason not to administer Beta Blockers:Not Applicable 

## 2013-01-21 ENCOUNTER — Encounter (HOSPITAL_COMMUNITY): Payer: Self-pay | Admitting: Internal Medicine

## 2013-01-21 DIAGNOSIS — I4891 Unspecified atrial fibrillation: Secondary | ICD-10-CM | POA: Diagnosis not present

## 2013-01-24 ENCOUNTER — Encounter: Payer: Medicare Other | Admitting: Internal Medicine

## 2013-02-05 DIAGNOSIS — I4891 Unspecified atrial fibrillation: Secondary | ICD-10-CM | POA: Diagnosis not present

## 2013-02-07 ENCOUNTER — Ambulatory Visit: Payer: Self-pay | Admitting: Pharmacist Clinician (PhC)/ Clinical Pharmacy Specialist

## 2013-02-07 DIAGNOSIS — I4891 Unspecified atrial fibrillation: Secondary | ICD-10-CM | POA: Insufficient documentation

## 2013-02-07 DIAGNOSIS — Z7901 Long term (current) use of anticoagulants: Secondary | ICD-10-CM | POA: Insufficient documentation

## 2013-02-18 DIAGNOSIS — E782 Mixed hyperlipidemia: Secondary | ICD-10-CM | POA: Diagnosis not present

## 2013-02-18 DIAGNOSIS — I10 Essential (primary) hypertension: Secondary | ICD-10-CM | POA: Diagnosis not present

## 2013-02-18 DIAGNOSIS — R5381 Other malaise: Secondary | ICD-10-CM | POA: Diagnosis not present

## 2013-02-18 DIAGNOSIS — I4891 Unspecified atrial fibrillation: Secondary | ICD-10-CM | POA: Diagnosis not present

## 2013-02-18 DIAGNOSIS — R5383 Other fatigue: Secondary | ICD-10-CM | POA: Diagnosis not present

## 2013-02-27 ENCOUNTER — Other Ambulatory Visit: Payer: Self-pay | Admitting: *Deleted

## 2013-02-27 ENCOUNTER — Ambulatory Visit (INDEPENDENT_AMBULATORY_CARE_PROVIDER_SITE_OTHER): Payer: Medicare Other | Admitting: Internal Medicine

## 2013-02-27 ENCOUNTER — Encounter: Payer: Self-pay | Admitting: Internal Medicine

## 2013-02-27 VITALS — BP 130/90 | HR 62 | Ht 63.0 in | Wt 119.1 lb

## 2013-02-27 DIAGNOSIS — I079 Rheumatic tricuspid valve disease, unspecified: Secondary | ICD-10-CM

## 2013-02-27 DIAGNOSIS — I4891 Unspecified atrial fibrillation: Secondary | ICD-10-CM

## 2013-02-27 DIAGNOSIS — I4892 Unspecified atrial flutter: Secondary | ICD-10-CM | POA: Diagnosis not present

## 2013-02-27 DIAGNOSIS — Z7901 Long term (current) use of anticoagulants: Secondary | ICD-10-CM

## 2013-02-27 DIAGNOSIS — I10 Essential (primary) hypertension: Secondary | ICD-10-CM | POA: Insufficient documentation

## 2013-02-27 DIAGNOSIS — I071 Rheumatic tricuspid insufficiency: Secondary | ICD-10-CM

## 2013-02-27 NOTE — Patient Instructions (Addendum)
Your physician recommends that you schedule a follow-up appointment in as needed   Your physician has recommended you make the following change in your medication:  1) Stop Sotalol

## 2013-02-27 NOTE — Progress Notes (Signed)
Primary Care Physician: Colette Ribas, MD Referring Physician:  Dr Lady Deutscher Kathleen Mcconnell is a 72 y.o. female with a h/o persistent atrial fibrillation, HTN and severe TR who presents for EP consultation.  She reports that she was initially diagnosed with atrial fibrillation years ago when she presented with weakenss and palpitations.  She was placed on sotalol.  She did well without symptoms of afib until 2 months ago.  She began noticing SOB with moderate activity such as climbing stairs.  She presented to Dr Rennis Golden and was noted to be back in afib.  She was placed on coumadin and her sotalol was increased.  She underwent cardioversion but quickly returned to afib.  She reports that her afib is much less noticeable than during the past.  She does not feel significantly limited or that her quality of life is significantly impaired.   Today, she denies symptoms of palpitations, chest pain, orthopnea, PND, lower extremity edema, dizziness, presyncope, syncope, or neurologic sequela. The patient is tolerating medications without difficulties and is otherwise without complaint today.   Past Medical History  Diagnosis Date  . Hypertension   . Persistent atrial fibrillation     History of  . Severe tricuspid regurgitation   . Thyroid mass 10/10/2001    benign lesion removed  . Hypothyroidism   . Hyperlipidemia    Past Surgical History  Procedure Laterality Date  . Thyroidectomy  10/10/2001  . Cardioversion N/A 01/20/2013    Procedure: CARDIOVERSION;  Surgeon: Chrystie Nose, MD;  Location: [facility] ENDOSCOPY;  Service: Cardiovascular;  Laterality: N/A;    Current Outpatient Prescriptions  Medication Sig Dispense Refill  . acetaminophen (TYLENOL) 500 MG tablet Take 1,000 mg by mouth every 6 (six) hours as needed. For pain      . alendronate (FOSAMAX) 70 MG tablet Take 70 mg by mouth every 7 (seven) days. Take with a full glass of water on an empty stomach.      . B Complex Vitamins (B  COMPLEX 1 PO) Take 1 tablet by mouth daily.      Marland Kitchen CALCIUM-VITAMIN D PO Take 1 tablet by mouth 2 (two) times daily.      Marland Kitchen diltiazem (CARDIZEM CD) 300 MG 24 hr capsule Take 240 mg by mouth daily.       . fish oil-omega-3 fatty acids 1000 MG capsule Take 1 g by mouth daily.      Marland Kitchen levothyroxine (SYNTHROID, LEVOTHROID) 88 MCG tablet Take 88 mcg by mouth daily.      Marland Kitchen lisinopril (PRINIVIL,ZESTRIL) 20 MG tablet Take 20 mg by mouth 2 (two) times daily.       Marland Kitchen losartan (COZAAR) 100 MG tablet Take 100 mg by mouth every evening. Take 1 tab      . Multiple Vitamin (MULTIVITAMIN WITH MINERALS) TABS Take 1 tablet by mouth daily.      . rosuvastatin (CRESTOR) 5 MG tablet Take 5 mg by mouth daily.      . sotalol (BETAPACE) 120 MG tablet Take 120 mg by mouth 2 (two) times daily.      Marland Kitchen warfarin (COUMADIN) 6 MG tablet Take 6 mg by mouth daily. Takes 1 tablet every day, except Tuesday and Thursday-takes 1/2 tablet on these days.       No current facility-administered medications for this visit.    Allergies  Allergen Reactions  . Aspirin Anaphylaxis, Hives, Rash and Other (See Comments)    As well as short of breath; Previously  was rushed to hospital-given epinephrine injection.     History   Social History  . Marital Status: Married    Spouse Name: N/A    Number of Children: 2  . Years of Education: N/A   Occupational History  . retired    Social History Main Topics  . Smoking status: Never Smoker   . Smokeless tobacco: Never Used  . Alcohol Use: No  . Drug Use: No  . Sexually Active: Not on file   Other Topics Concern  . Not on file   Social History Narrative   Pt lives in [place] with spouse.  Retired Catering manager.    Family History  Problem Relation Age of Onset  . Heart disease Father     deceaced 43  . Heart disease Brother     x 3, one deceased age 45  . Heart disease Sister     ROS- All systems are reviewed and negative except as per the HPI above  Physical  Exam: Filed Vitals:   02/27/13 0951  BP: 130/90  Pulse: 62  Height: 5\' 3"  (1.6 m)  Weight: 119 lb 1.9 oz (54.032 kg)    GEN- The patient is well appearing, alert and oriented x 3 today.   Head- normocephalic, atraumatic Eyes-  Sclera clear, conjunctiva pink Ears- hearing intact Oropharynx- clear Neck- supple, no JVP Lymph- no cervical lymphadenopathy Lungs- Clear to ausculation bilaterally, normal work of breathing Heart- irregular rate and rhythm, no murmurs, rubs or gallops, PMI not laterally displaced GI- soft, NT, ND, + BS Extremities- no clubbing, cyanosis, or edema MS- no significant deformity or atrophy Skin- no rash or lesion Psych- euthymic mood, full affect Neuro- strength and sensation are intact  EKG today reveals coarse afib, V rate 62 bpm Echo 2/12 reviewed showing normal EF, severe TR  Assessment and Plan:  1. The patient has minimally symptomatic persistent afib in the setting of prior severe TR.  She has not had an echo since 2012. Therapeutic strategies for afib including medicine and ablation were discussed in detail with the patient today. Risk, benefits, and alternatives to EP study and radiofrequency ablation for afib were also discussed in detail today. She is aware that her anticipated success rates from ablation are 50-60% in the setting of structural heart disease and persistent afib with a high likelihood of repeat procedures required. She is clear that she would not like to pursue ablation at this time.  We discussed amiodarone as well as rate control according the AFFIRM trial.  She would prefer rate control at this time.  She is appropriately anticoagulated with with coumadin.  I would continue this long term. I will ask Dr Rennis Golden to repeat an echo.  If she continues to have severe valvular heart disease, then consultation with Dr Cornelius Moras for valve repair and surgical maze might be another option.  She will stop sotalol today as it has been demonstrated to  be ineffective at this point for her.  Continue cardizem for rate control. I will see her as needed going forward.  2. HTN Stable No change required today

## 2013-03-05 DIAGNOSIS — L678 Other hair color and hair shaft abnormalities: Secondary | ICD-10-CM | POA: Diagnosis not present

## 2013-03-05 DIAGNOSIS — C4360 Malignant melanoma of unspecified upper limb, including shoulder: Secondary | ICD-10-CM | POA: Diagnosis not present

## 2013-03-05 DIAGNOSIS — L570 Actinic keratosis: Secondary | ICD-10-CM | POA: Diagnosis not present

## 2013-03-05 DIAGNOSIS — L680 Hirsutism: Secondary | ICD-10-CM | POA: Diagnosis not present

## 2013-03-05 DIAGNOSIS — L738 Other specified follicular disorders: Secondary | ICD-10-CM | POA: Diagnosis not present

## 2013-03-05 DIAGNOSIS — L821 Other seborrheic keratosis: Secondary | ICD-10-CM | POA: Diagnosis not present

## 2013-03-07 DIAGNOSIS — I4891 Unspecified atrial fibrillation: Secondary | ICD-10-CM | POA: Diagnosis not present

## 2013-03-18 DIAGNOSIS — C4360 Malignant melanoma of unspecified upper limb, including shoulder: Secondary | ICD-10-CM | POA: Diagnosis not present

## 2013-04-01 DIAGNOSIS — H40019 Open angle with borderline findings, low risk, unspecified eye: Secondary | ICD-10-CM | POA: Diagnosis not present

## 2013-04-01 DIAGNOSIS — Z961 Presence of intraocular lens: Secondary | ICD-10-CM | POA: Diagnosis not present

## 2013-04-08 ENCOUNTER — Other Ambulatory Visit (HOSPITAL_COMMUNITY): Payer: Self-pay | Admitting: Internal Medicine

## 2013-04-08 DIAGNOSIS — I070 Rheumatic tricuspid stenosis: Secondary | ICD-10-CM

## 2013-04-10 DIAGNOSIS — I4891 Unspecified atrial fibrillation: Secondary | ICD-10-CM | POA: Diagnosis not present

## 2013-04-17 ENCOUNTER — Ambulatory Visit (HOSPITAL_COMMUNITY)
Admission: RE | Admit: 2013-04-17 | Discharge: 2013-04-17 | Disposition: A | Discharge status: Home / Self Care | Payer: Medicare Other | Source: Ambulatory Visit | Attending: Cardiovascular Disease | Admitting: Cardiovascular Disease

## 2013-04-17 DIAGNOSIS — I4891 Unspecified atrial fibrillation: Secondary | ICD-10-CM | POA: Insufficient documentation

## 2013-04-17 DIAGNOSIS — I517 Cardiomegaly: Secondary | ICD-10-CM | POA: Insufficient documentation

## 2013-04-17 DIAGNOSIS — I10 Essential (primary) hypertension: Secondary | ICD-10-CM | POA: Insufficient documentation

## 2013-04-17 DIAGNOSIS — E785 Hyperlipidemia, unspecified: Secondary | ICD-10-CM | POA: Insufficient documentation

## 2013-04-17 DIAGNOSIS — I059 Rheumatic mitral valve disease, unspecified: Secondary | ICD-10-CM | POA: Insufficient documentation

## 2013-04-17 DIAGNOSIS — I369 Nonrheumatic tricuspid valve disorder, unspecified: Secondary | ICD-10-CM | POA: Diagnosis not present

## 2013-04-17 DIAGNOSIS — I079 Rheumatic tricuspid valve disease, unspecified: Secondary | ICD-10-CM | POA: Insufficient documentation

## 2013-04-17 DIAGNOSIS — I359 Nonrheumatic aortic valve disorder, unspecified: Secondary | ICD-10-CM | POA: Insufficient documentation

## 2013-04-17 DIAGNOSIS — I070 Rheumatic tricuspid stenosis: Secondary | ICD-10-CM

## 2013-04-17 DIAGNOSIS — I071 Rheumatic tricuspid insufficiency: Secondary | ICD-10-CM

## 2013-04-17 NOTE — Progress Notes (Signed)
[facility]   2D echo completed 04/17/2013.   Cindy Jaymes Hang, RDCS  

## 2013-04-30 ENCOUNTER — Ambulatory Visit (INDEPENDENT_AMBULATORY_CARE_PROVIDER_SITE_OTHER): Payer: Medicare Other | Admitting: Pharmacist Clinician (PhC)/ Clinical Pharmacy Specialist

## 2013-04-30 DIAGNOSIS — I4891 Unspecified atrial fibrillation: Secondary | ICD-10-CM | POA: Diagnosis not present

## 2013-04-30 DIAGNOSIS — Z7901 Long term (current) use of anticoagulants: Secondary | ICD-10-CM | POA: Diagnosis not present

## 2013-04-30 LAB — POCT INR: INR: 2.8

## 2013-05-07 NOTE — Progress Notes (Signed)
Can't close due to provider

## 2013-05-08 ENCOUNTER — Ambulatory Visit: Payer: Medicare Other

## 2013-05-16 ENCOUNTER — Other Ambulatory Visit: Payer: Self-pay | Admitting: *Deleted

## 2013-05-16 MED ORDER — WARFARIN SODIUM 6 MG PO TABS: 6.0000 mg | ORAL_TABLET | Freq: Every day | ORAL | Status: DC

## 2013-05-16 MED ORDER — LISINOPRIL 20 MG PO TABS: 20.0000 mg | ORAL_TABLET | Freq: Two times a day (BID) | ORAL | Status: DC

## 2013-05-28 ENCOUNTER — Ambulatory Visit (INDEPENDENT_AMBULATORY_CARE_PROVIDER_SITE_OTHER): Payer: Medicare Other | Admitting: *Deleted

## 2013-05-28 DIAGNOSIS — I4891 Unspecified atrial fibrillation: Secondary | ICD-10-CM | POA: Diagnosis not present

## 2013-05-28 DIAGNOSIS — Z7901 Long term (current) use of anticoagulants: Secondary | ICD-10-CM | POA: Diagnosis not present

## 2013-05-28 LAB — POCT INR: INR: 3.3

## 2013-06-16 ENCOUNTER — Ambulatory Visit (INDEPENDENT_AMBULATORY_CARE_PROVIDER_SITE_OTHER): Payer: Medicare Other | Admitting: *Deleted

## 2013-06-16 DIAGNOSIS — I4891 Unspecified atrial fibrillation: Secondary | ICD-10-CM

## 2013-06-16 DIAGNOSIS — Z7901 Long term (current) use of anticoagulants: Secondary | ICD-10-CM | POA: Diagnosis not present

## 2013-06-16 LAB — POCT INR: INR: 3.2

## 2013-06-19 DIAGNOSIS — L905 Scar conditions and fibrosis of skin: Secondary | ICD-10-CM | POA: Diagnosis not present

## 2013-06-19 DIAGNOSIS — L570 Actinic keratosis: Secondary | ICD-10-CM | POA: Diagnosis not present

## 2013-06-19 DIAGNOSIS — D2360 Other benign neoplasm of skin of unspecified upper limb, including shoulder: Secondary | ICD-10-CM | POA: Diagnosis not present

## 2013-06-19 DIAGNOSIS — D485 Neoplasm of uncertain behavior of skin: Secondary | ICD-10-CM | POA: Diagnosis not present

## 2013-06-19 DIAGNOSIS — D1801 Hemangioma of skin and subcutaneous tissue: Secondary | ICD-10-CM | POA: Diagnosis not present

## 2013-06-19 DIAGNOSIS — Z85820 Personal history of malignant melanoma of skin: Secondary | ICD-10-CM | POA: Diagnosis not present

## 2013-06-19 DIAGNOSIS — L821 Other seborrheic keratosis: Secondary | ICD-10-CM | POA: Diagnosis not present

## 2013-06-19 DIAGNOSIS — D239 Other benign neoplasm of skin, unspecified: Secondary | ICD-10-CM | POA: Diagnosis not present

## 2013-06-19 DIAGNOSIS — D2370 Other benign neoplasm of skin of unspecified lower limb, including hip: Secondary | ICD-10-CM | POA: Diagnosis not present

## 2013-07-07 ENCOUNTER — Ambulatory Visit (INDEPENDENT_AMBULATORY_CARE_PROVIDER_SITE_OTHER): Payer: Medicare Other | Admitting: *Deleted

## 2013-07-07 DIAGNOSIS — I4891 Unspecified atrial fibrillation: Secondary | ICD-10-CM

## 2013-07-07 DIAGNOSIS — Z7901 Long term (current) use of anticoagulants: Secondary | ICD-10-CM | POA: Diagnosis not present

## 2013-07-07 LAB — POCT INR: INR: 1.7

## 2013-07-24 ENCOUNTER — Ambulatory Visit (INDEPENDENT_AMBULATORY_CARE_PROVIDER_SITE_OTHER): Payer: Medicare Other | Admitting: *Deleted

## 2013-07-24 DIAGNOSIS — I4891 Unspecified atrial fibrillation: Secondary | ICD-10-CM | POA: Diagnosis not present

## 2013-07-24 DIAGNOSIS — Z7901 Long term (current) use of anticoagulants: Secondary | ICD-10-CM

## 2013-07-24 LAB — POCT INR: INR: 2.3

## 2013-08-11 ENCOUNTER — Ambulatory Visit: Payer: Medicare Other | Admitting: Internal Medicine

## 2013-08-12 ENCOUNTER — Encounter: Payer: Self-pay | Admitting: *Deleted

## 2013-08-13 ENCOUNTER — Encounter: Payer: Self-pay | Admitting: Internal Medicine

## 2013-08-13 ENCOUNTER — Ambulatory Visit (INDEPENDENT_AMBULATORY_CARE_PROVIDER_SITE_OTHER): Payer: Medicare Other | Admitting: Internal Medicine

## 2013-08-13 VITALS — BP 128/88 | HR 84 | Ht 63.0 in | Wt 116.2 lb

## 2013-08-13 DIAGNOSIS — I10 Essential (primary) hypertension: Secondary | ICD-10-CM | POA: Diagnosis not present

## 2013-08-13 DIAGNOSIS — I4891 Unspecified atrial fibrillation: Secondary | ICD-10-CM

## 2013-08-13 MED ORDER — DILTIAZEM HCL ER COATED BEADS 240 MG PO CP24: 240.0000 mg | ORAL_CAPSULE | Freq: Every day | ORAL | Status: DC

## 2013-08-13 MED ORDER — ROSUVASTATIN CALCIUM 5 MG PO TABS: 5.0000 mg | ORAL_TABLET | Freq: Every day | ORAL | Status: DC

## 2013-08-13 MED ORDER — RIVAROXABAN 20 MG PO TABS: 20.0000 mg | ORAL_TABLET | Freq: Every day | ORAL | Status: DC

## 2013-08-13 MED ORDER — LOSARTAN POTASSIUM 100 MG PO TABS: 100.0000 mg | ORAL_TABLET | Freq: Every evening | ORAL | Status: DC

## 2013-08-13 MED ORDER — LISINOPRIL 20 MG PO TABS: 20.0000 mg | ORAL_TABLET | Freq: Two times a day (BID) | ORAL | Status: DC

## 2013-08-13 NOTE — Patient Instructions (Addendum)
Your physician wants you to follow-up in: 1 year. You will receive a reminder letter in the mail two months in advance. If you don't receive a letter, please call our office to schedule the follow-up appointment.  Stop Warfarin and START Xarelto 20mg  once daily.

## 2013-08-13 NOTE — Progress Notes (Signed)
OFFICE NOTE  Chief Complaint: Routine follow-up  Primary Care Physician: Colette Ribas, MD  HPI:  Kathleen Mcconnell is a 72 year old female with a history of paroxysmal atrial fibrillation, on sotalol, who had a cardioversion in 2004 and had not had recurrence until recently. She also has signs of rheumatic heart disease on her echocardiogram with some sclerosis of the aortic valve, and moderate to severe TR and mild MR with a flat coaptation on the mitral valve. In addition, her EF is greater than 55% and she has normal atrial sizes. Recently, she had presented to the office for an annual followup and was found to be in atrial fibrillation with controlled ventricular response. She was clearly unaware that she was in atrial fibrillation; however, she did note that she was getting a little more short of breath when walking up stairs and had been a little bit more fatigued recently. She has been maintained on warfarin and has done fairly well with maintaining a therapeutic INR. She is also on diltiazem additionally for rate control and also for hypertension. I, therefore, recommended increasing her sotalol to 120 mg twice daily and set her up for a cardioversion. She underwent cardioversion on November 19, 2012, with a single 150-joule biphasic shock. She converted to a sinus rhythm and afterwards felt very well. This persisted for several weeks until recently; she started to feel tired again, not nearly as good as shortly after the procedure was performed. On followup visit, she was noted again to be in atrial fibrillation, now with slow ventricular response at a rate of 56. She claimed to be fairly symptomatic and therefore refer her to see Dr. Johney Frame with cardiac electrophysiology. He evaluated her and felt that due to her severe tricuspid regurgitation, she was not a good candidate for atrial fibrillation ablation, as well as the fact that she is not symptomatic.  He also recommended discontinuing  sotalol and pursuing rate control. She has since transitioned over to her warfarin checks with [facility] in [place].  PMHx:  Past Medical History  Diagnosis Date  . Hypertension   . Persistent atrial fibrillation     History of; cardioversion in 2004  . Severe tricuspid regurgitation     on echo 02/2011 ?rheumatic heart disease  . Thyroid mass 10/10/2001    benign lesion removed  . Hypothyroidism   . Hyperlipidemia   . Mild aortic insufficiency   . Mitral valve regurgitation   . History of nuclear stress test 03/2012    lexiscan; normal pattern of perfusion; normal, low risk     Past Surgical History  Procedure Laterality Date  . Thyroidectomy  10/10/2001  . Cardioversion N/A 01/20/2013    Procedure: CARDIOVERSION;  Surgeon: Chrystie Nose, MD;  Location: [facility] ENDOSCOPY;  Service: Cardiovascular;  Laterality: N/A;  . Transthoracic echocardiogram  02/2011    EF=>55%; mild MR; mod-severe TR & elevated RV systolic pressure, mild pulm HTN; aortic valve mildly sclerotic with mild regurg  . Cardiac catheterization  2003    normal systolic function, near normal coronaries     FAMHx:  Family History  Problem Relation Age of Onset  . Heart disease Father     also MI  . Heart disease Brother     x 3, one deceased age 46  . Heart disease Sister     SOCHx:   reports that she has never smoked. She has never used smokeless tobacco. She reports that she does not drink alcohol or use illicit drugs.  ALLERGIES:  Allergies  Allergen Reactions  . Aspirin Anaphylaxis, Hives, Rash and Other (See Comments)    As well as short of breath; Previously was rushed to hospital-given epinephrine injection.     ROS: A comprehensive review of systems was negative.  HOME MEDS: Current Outpatient Prescriptions  Medication Sig Dispense Refill  . acetaminophen (TYLENOL) 500 MG tablet Take 1,000 mg by mouth every 6 (six) hours as needed. For pain      . alendronate (FOSAMAX) 70 MG tablet Take 70 mg  by mouth every 7 (seven) days. Take with a full glass of water on an empty stomach.      . B Complex Vitamins (B COMPLEX 1 PO) Take 1 tablet by mouth daily.      Marland Kitchen CALCIUM-VITAMIN D PO Take 1 tablet by mouth 2 (two) times daily.      Marland Kitchen diltiazem (CARDIZEM CD) 240 MG 24 hr capsule Take 1 capsule (240 mg total) by mouth daily.  30 capsule  11  . fish oil-omega-3 fatty acids 1000 MG capsule Take 1 g by mouth daily.      Marland Kitchen levothyroxine (SYNTHROID, LEVOTHROID) 88 MCG tablet Take 88 mcg by mouth daily.      Marland Kitchen lisinopril (PRINIVIL,ZESTRIL) 20 MG tablet Take 1 tablet (20 mg total) by mouth 2 (two) times daily.  60 tablet  11  . losartan (COZAAR) 100 MG tablet Take 1 tablet (100 mg total) by mouth every evening. Take 1 tab  30 tablet  11  . Multiple Vitamin (MULTIVITAMIN WITH MINERALS) TABS Take 1 tablet by mouth daily.      . rosuvastatin (CRESTOR) 5 MG tablet Take 1 tablet (5 mg total) by mouth daily.  30 tablet  11  . Rivaroxaban (XARELTO) 20 MG TABS tablet Take 1 tablet (20 mg total) by mouth daily.  30 tablet  11   No current facility-administered medications for this visit.    LABS/IMAGING: No results found for this or any previous visit (from the past 48 hour(s)). No results found.  VITALS: BP 128/88  Pulse 84  Ht 5\' 3"  (1.6 m)  Wt 116 lb 3.2 oz (52.708 kg)  BMI 20.59 kg/m2  EXAM: General appearance: alert and no distress Neck: no adenopathy, no carotid bruit, no JVD, supple, symmetrical, trachea midline and thyroid not enlarged, symmetric, no tenderness/mass/nodules Lungs: clear to auscultation bilaterally Heart: regular rate and rhythm, S1, S2 normal, no murmur, click, rub or gallop Abdomen: soft, non-tender; bowel sounds normal; no masses,  no organomegaly Extremities: extremities normal, atraumatic, no cyanosis or edema Pulses: 2+ and symmetric Skin: Skin color, texture, turgor normal. No rashes or lesions Neurologic: Grossly normal  EKG: Atrial fibrillation at  84  ASSESSMENT: 1. Persistent atrial fibrillation-asymptomatic 2. Hypertension-controlled  PLAN: 1.   [name] continues to be in persistent atrial fibrillation. As mentioned above she has undergone cardioversion and was on antiarrhythmic therapy but failed that. Dr. Johney Frame felt that she was not a good candidate for atrial fibrillation ablation. We did discuss her anticoagulation today, and the inconvenience that warfarin checks poses to her. Also she's had some difficulty in regulating her are from levels despite a stable diet and good compliance to medications. Based on this I think she would have a better time in therapeutic range on a NOAC. We discussed several options today and with her insurance coverage I think she would do best on a once daily Xarelto 20 mg. I asked Belenda Cruise in our pharmacist to speak with her about  switching today and we provided samples. We went ahead and rechecked her INR today and it was 2.4. Therefore will go ahead and just start her with a Xarelto tonight. She will not need a followup with further warfarin checks with [facility] in [place].  Chrystie Nose, MD, [facility] Attending Cardiologist [facility]  Anay Rathe C 08/13/2013, 9:20 AM

## 2013-09-09 ENCOUNTER — Other Ambulatory Visit (HOSPITAL_COMMUNITY): Payer: Self-pay | Admitting: Family Medicine

## 2013-09-09 DIAGNOSIS — Z23 Encounter for immunization: Secondary | ICD-10-CM | POA: Diagnosis not present

## 2013-09-09 DIAGNOSIS — IMO0002 Reserved for concepts with insufficient information to code with codable children: Secondary | ICD-10-CM | POA: Diagnosis not present

## 2013-09-09 DIAGNOSIS — M810 Age-related osteoporosis without current pathological fracture: Secondary | ICD-10-CM

## 2013-09-09 DIAGNOSIS — E039 Hypothyroidism, unspecified: Secondary | ICD-10-CM | POA: Diagnosis not present

## 2013-09-15 ENCOUNTER — Ambulatory Visit: Payer: Self-pay | Admitting: Pharmacist Clinician (PhC)/ Clinical Pharmacy Specialist

## 2013-09-15 DIAGNOSIS — Z7901 Long term (current) use of anticoagulants: Secondary | ICD-10-CM

## 2013-09-15 DIAGNOSIS — I4891 Unspecified atrial fibrillation: Secondary | ICD-10-CM

## 2013-10-06 ENCOUNTER — Ambulatory Visit (HOSPITAL_COMMUNITY)
Admission: RE | Admit: 2013-10-06 | Discharge: 2013-10-06 | Disposition: A | Discharge status: Home / Self Care | Payer: Medicare Other | Source: Ambulatory Visit | Attending: Family Medicine | Admitting: Family Medicine

## 2013-10-06 DIAGNOSIS — M810 Age-related osteoporosis without current pathological fracture: Secondary | ICD-10-CM

## 2013-10-06 DIAGNOSIS — M899 Disorder of bone, unspecified: Secondary | ICD-10-CM | POA: Diagnosis not present

## 2013-12-01 DIAGNOSIS — IMO0002 Reserved for concepts with insufficient information to code with codable children: Secondary | ICD-10-CM | POA: Diagnosis not present

## 2013-12-01 DIAGNOSIS — R059 Cough, unspecified: Secondary | ICD-10-CM | POA: Diagnosis not present

## 2013-12-01 DIAGNOSIS — R05 Cough: Secondary | ICD-10-CM | POA: Diagnosis not present

## 2014-01-21 DIAGNOSIS — Z85820 Personal history of malignant melanoma of skin: Secondary | ICD-10-CM | POA: Diagnosis not present

## 2014-01-21 DIAGNOSIS — L821 Other seborrheic keratosis: Secondary | ICD-10-CM | POA: Diagnosis not present

## 2014-01-21 DIAGNOSIS — D1801 Hemangioma of skin and subcutaneous tissue: Secondary | ICD-10-CM | POA: Diagnosis not present

## 2014-01-21 DIAGNOSIS — C44319 Basal cell carcinoma of skin of other parts of face: Secondary | ICD-10-CM | POA: Diagnosis not present

## 2014-01-21 DIAGNOSIS — L905 Scar conditions and fibrosis of skin: Secondary | ICD-10-CM | POA: Diagnosis not present

## 2014-02-19 DIAGNOSIS — C44319 Basal cell carcinoma of skin of other parts of face: Secondary | ICD-10-CM | POA: Diagnosis not present

## 2014-02-19 DIAGNOSIS — Z85820 Personal history of malignant melanoma of skin: Secondary | ICD-10-CM | POA: Diagnosis not present

## 2014-02-19 DIAGNOSIS — Z85828 Personal history of other malignant neoplasm of skin: Secondary | ICD-10-CM | POA: Diagnosis not present

## 2014-03-05 DIAGNOSIS — Z85828 Personal history of other malignant neoplasm of skin: Secondary | ICD-10-CM | POA: Diagnosis not present

## 2014-03-05 DIAGNOSIS — L819 Disorder of pigmentation, unspecified: Secondary | ICD-10-CM | POA: Diagnosis not present

## 2014-03-05 DIAGNOSIS — Z85820 Personal history of malignant melanoma of skin: Secondary | ICD-10-CM | POA: Diagnosis not present

## 2014-03-05 DIAGNOSIS — D2370 Other benign neoplasm of skin of unspecified lower limb, including hip: Secondary | ICD-10-CM | POA: Diagnosis not present

## 2014-04-03 DIAGNOSIS — H40019 Open angle with borderline findings, low risk, unspecified eye: Secondary | ICD-10-CM | POA: Diagnosis not present

## 2014-04-03 DIAGNOSIS — Z961 Presence of intraocular lens: Secondary | ICD-10-CM | POA: Diagnosis not present

## 2014-05-20 ENCOUNTER — Telehealth: Payer: Self-pay | Admitting: Internal Medicine

## 2014-05-20 NOTE — Telephone Encounter (Signed)
Pt would like to have some discount saving cards for Xarelto please.If you have them ,please call and let her know.

## 2014-05-20 NOTE — Telephone Encounter (Signed)
Xarelto savings card mailed to patient.

## 2014-05-27 ENCOUNTER — Telehealth: Payer: Self-pay | Admitting: Internal Medicine

## 2014-05-27 NOTE — Telephone Encounter (Signed)
Is wanting to know how do she go off of xarelto and go back on warfrain . Please Call   Thanks

## 2014-05-27 NOTE — Telephone Encounter (Signed)
Returned call to patient. She would like to go back on warfarin due to cost of Xarelto ($144/month and she is no eligible for savings cards).   Will defer to Dr. Debara Pickett to advise and send message to Edrick Oh, RN as Juluis Rainier

## 2014-05-28 NOTE — Telephone Encounter (Signed)
Ok .. Please defer to [place].  She was managing her warfarin before. Thanks Erasmo Downer.  -Dr. Lemmie Evens

## 2014-05-28 NOTE — Telephone Encounter (Signed)
Pt cannot afford Xarelto, would like to re-start warfarin.  She has 30 days of Xarelto left.  Advised that when she's down to 3 tablets of Xarelto she re-start warfarin at 6mg  x 3 then resume previous dose of 6mg  qd x 3mg  MWF.  She knows to call Edrick Oh in [place] for follow up INR within 1 week of restarting warfarin

## 2014-07-02 ENCOUNTER — Ambulatory Visit (INDEPENDENT_AMBULATORY_CARE_PROVIDER_SITE_OTHER): Payer: Medicare Other | Admitting: *Deleted

## 2014-07-02 DIAGNOSIS — I4891 Unspecified atrial fibrillation: Secondary | ICD-10-CM | POA: Diagnosis not present

## 2014-07-02 DIAGNOSIS — Z7902 Long term (current) use of antithrombotics/antiplatelets: Secondary | ICD-10-CM | POA: Diagnosis not present

## 2014-07-02 LAB — POCT INR: INR: 2.9

## 2014-07-16 ENCOUNTER — Ambulatory Visit (INDEPENDENT_AMBULATORY_CARE_PROVIDER_SITE_OTHER): Payer: Medicare Other | Admitting: *Deleted

## 2014-07-16 DIAGNOSIS — Z7902 Long term (current) use of antithrombotics/antiplatelets: Secondary | ICD-10-CM | POA: Diagnosis not present

## 2014-07-16 DIAGNOSIS — I4891 Unspecified atrial fibrillation: Secondary | ICD-10-CM | POA: Diagnosis not present

## 2014-07-16 LAB — POCT INR: INR: 2.4

## 2014-07-21 ENCOUNTER — Other Ambulatory Visit: Payer: Self-pay | Admitting: Internal Medicine

## 2014-08-04 ENCOUNTER — Other Ambulatory Visit: Payer: Self-pay

## 2014-08-04 MED ORDER — DILTIAZEM HCL ER COATED BEADS 240 MG PO CP24: 240.0000 mg | ORAL_CAPSULE | Freq: Every day | ORAL | Status: DC

## 2014-08-04 NOTE — Telephone Encounter (Signed)
Rx was sent to pharmacy electronically. 

## 2014-08-13 ENCOUNTER — Ambulatory Visit (INDEPENDENT_AMBULATORY_CARE_PROVIDER_SITE_OTHER): Payer: Medicare Other | Admitting: *Deleted

## 2014-08-13 DIAGNOSIS — Z7902 Long term (current) use of antithrombotics/antiplatelets: Secondary | ICD-10-CM

## 2014-08-13 DIAGNOSIS — I4891 Unspecified atrial fibrillation: Secondary | ICD-10-CM

## 2014-08-13 LAB — POCT INR: INR: 2.2

## 2014-08-25 ENCOUNTER — Other Ambulatory Visit: Payer: Self-pay | Admitting: *Deleted

## 2014-08-25 MED ORDER — LOSARTAN POTASSIUM 100 MG PO TABS: 100.0000 mg | ORAL_TABLET | Freq: Every evening | ORAL | Status: DC

## 2014-09-03 DIAGNOSIS — I10 Essential (primary) hypertension: Secondary | ICD-10-CM | POA: Diagnosis not present

## 2014-09-03 DIAGNOSIS — E039 Hypothyroidism, unspecified: Secondary | ICD-10-CM | POA: Diagnosis not present

## 2014-09-03 DIAGNOSIS — E785 Hyperlipidemia, unspecified: Secondary | ICD-10-CM | POA: Diagnosis not present

## 2014-09-03 DIAGNOSIS — I4891 Unspecified atrial fibrillation: Secondary | ICD-10-CM | POA: Diagnosis not present

## 2014-09-03 DIAGNOSIS — Z23 Encounter for immunization: Secondary | ICD-10-CM | POA: Diagnosis not present

## 2014-09-03 DIAGNOSIS — Z6820 Body mass index (BMI) 20.0-20.9, adult: Secondary | ICD-10-CM | POA: Diagnosis not present

## 2014-09-07 ENCOUNTER — Telehealth: Payer: Self-pay | Admitting: Internal Medicine

## 2014-09-07 ENCOUNTER — Other Ambulatory Visit: Payer: Self-pay

## 2014-09-07 MED ORDER — LISINOPRIL 20 MG PO TABS: 20.0000 mg | ORAL_TABLET | Freq: Two times a day (BID) | ORAL | Status: DC

## 2014-09-07 MED ORDER — DILTIAZEM HCL ER COATED BEADS 240 MG PO CP24: 240.0000 mg | ORAL_CAPSULE | Freq: Every day | ORAL | Status: DC

## 2014-09-07 MED ORDER — ATORVASTATIN CALCIUM 10 MG PO TABS: 10.0000 mg | ORAL_TABLET | Freq: Every day | ORAL | Status: DC

## 2014-09-07 NOTE — Telephone Encounter (Signed)
Will forward to dr hilty for his review.

## 2014-09-07 NOTE — Telephone Encounter (Signed)
We could try her on lipitor 10 mg daily, instead of Crestor, if she doesn't have a prior intolerance to it.  Dr. Lemmie Evens

## 2014-09-07 NOTE — Telephone Encounter (Signed)
Rx sent to pharmacy   

## 2014-09-07 NOTE — Telephone Encounter (Signed)
Kathleen Mcconnell is asking if there is anything that is similar to Crestor that she can take because the Crestor is so expensive . Please call   Thanks

## 2014-09-07 NOTE — Telephone Encounter (Signed)
Spoke with pt, aware of dr hilty's recommendations. Script sent into the pharm.

## 2014-09-17 DIAGNOSIS — Z1231 Encounter for screening mammogram for malignant neoplasm of breast: Secondary | ICD-10-CM | POA: Diagnosis not present

## 2014-09-22 ENCOUNTER — Other Ambulatory Visit: Payer: Self-pay | Admitting: Internal Medicine

## 2014-09-22 NOTE — Telephone Encounter (Signed)
Rx was sent to pharmacy electronically. OV 10/30

## 2014-09-23 ENCOUNTER — Ambulatory Visit (INDEPENDENT_AMBULATORY_CARE_PROVIDER_SITE_OTHER): Payer: Medicare Other | Admitting: *Deleted

## 2014-09-23 DIAGNOSIS — I4891 Unspecified atrial fibrillation: Secondary | ICD-10-CM

## 2014-09-23 DIAGNOSIS — Z7902 Long term (current) use of antithrombotics/antiplatelets: Secondary | ICD-10-CM

## 2014-09-23 LAB — POCT INR: INR: 2.1

## 2014-10-02 ENCOUNTER — Ambulatory Visit (INDEPENDENT_AMBULATORY_CARE_PROVIDER_SITE_OTHER): Payer: Medicare Other | Admitting: Internal Medicine

## 2014-10-02 ENCOUNTER — Encounter: Payer: Self-pay | Admitting: Internal Medicine

## 2014-10-02 VITALS — BP 134/88 | HR 105 | Ht 63.0 in | Wt 118.2 lb

## 2014-10-02 DIAGNOSIS — I10 Essential (primary) hypertension: Secondary | ICD-10-CM | POA: Diagnosis not present

## 2014-10-02 DIAGNOSIS — I481 Persistent atrial fibrillation: Secondary | ICD-10-CM | POA: Diagnosis not present

## 2014-10-02 DIAGNOSIS — Z7901 Long term (current) use of anticoagulants: Secondary | ICD-10-CM

## 2014-10-02 DIAGNOSIS — I4819 Other persistent atrial fibrillation: Secondary | ICD-10-CM

## 2014-10-02 MED ORDER — METOPROLOL SUCCINATE ER 25 MG PO TB24: 25.0000 mg | ORAL_TABLET | Freq: Every day | ORAL | Status: DC

## 2014-10-02 NOTE — Patient Instructions (Signed)
Your physician has recommended you make the following change in your medication...  1. STOP losartan 2. START metoprolol succinate (toprol XL) 25mg  once daily   Please schedule a BP check with Kathleen Mcconnell (pharmacist) in 2 weeks  Your physician wants you to follow-up in: 6 months with Kathleen Mcconnell. You will receive a reminder letter in the mail two months in advance. If you don't receive a letter, please call our office to schedule the follow-up appointment.

## 2014-10-02 NOTE — Progress Notes (Signed)
OFFICE NOTE  Chief Complaint: Routine follow-up  Primary Care Physician: Rocky Morel, MD  HPI:  Kathleen Mcconnell is a 73 year old female with a history of paroxysmal atrial fibrillation, on sotalol, who had a cardioversion in 2004 and had not had recurrence until recently. She also has signs of rheumatic heart disease on her echocardiogram with some sclerosis of the aortic valve, and moderate to severe TR and mild MR with a flat coaptation on the mitral valve. In addition, her EF is greater than 55% and she has normal atrial sizes. Recently, she had presented to the office for an annual followup and was found to be in atrial fibrillation with controlled ventricular response. She was clearly unaware that she was in atrial fibrillation; however, she did note that she was getting a little more short of breath when walking up stairs and had been a little bit more fatigued recently. She has been maintained on warfarin and has done fairly well with maintaining a therapeutic INR. She is also on diltiazem additionally for rate control and also for hypertension. I, therefore, recommended increasing her sotalol to 120 mg twice daily and set her up for a cardioversion. She underwent cardioversion on November 19, 2012, with a single 150-joule biphasic shock. She converted to a sinus rhythm and afterwards felt very well. This persisted for several weeks until recently; she started to feel tired again, not nearly as good as shortly after the procedure was performed. On followup visit, she was noted again to be in atrial fibrillation, now with slow ventricular response at a rate of 56. She claimed to be fairly symptomatic and therefore refer her to see Dr. Rayann Heman with cardiac electrophysiology. He evaluated her and felt that due to her severe tricuspid regurgitation, she was not a good candidate for atrial fibrillation ablation, as well as the fact that she is not symptomatic.  He also recommended  discontinuing sotalol and pursuing rate control. She has since transitioned over to her warfarin checks with [facility] in [place].  [name] returns today for follow-up. She is noted to be in A. fib with mild rapid ventricular response today and rates in the low 100s. There is a question about her being on both ACE and ARB which was started over 10 years ago. Although this is not current practice I have continued on this medicine because he seems to be doing well, however it is somewhat redundant. She denies any chest pain or worsening shortness of breath. I do believe she could have better rate control.  PMHx:  Past Medical History  Diagnosis Date  . Hypertension   . Persistent atrial fibrillation     History of; cardioversion in 2004  . Severe tricuspid regurgitation     on echo 02/2011 ?rheumatic heart disease  . Thyroid mass 10/10/2001    benign lesion removed  . Hypothyroidism   . Hyperlipidemia   . Mild aortic insufficiency   . Mitral valve regurgitation   . History of nuclear stress test 03/2012    lexiscan; normal pattern of perfusion; normal, low risk     Past Surgical History  Procedure Laterality Date  . Thyroidectomy  10/10/2001  . Cardioversion N/A 01/20/2013    Procedure: CARDIOVERSION;  Surgeon: Pixie Casino, MD;  Location: [facility] ENDOSCOPY;  Service: Cardiovascular;  Laterality: N/A;  . Transthoracic echocardiogram  02/2011    EF=>55%; mild MR; mod-severe TR & elevated RV systolic pressure, mild pulm HTN; aortic valve mildly sclerotic with mild regurg  .  Cardiac catheterization  3151    normal systolic function, near normal coronaries     FAMHx:  Family History  Problem Relation Age of Onset  . Heart disease Father     also MI  . Heart disease Brother     x 3, one deceased age 1  . Heart disease Sister     SOCHx:   reports that she has never smoked. She has never used smokeless tobacco. She reports that she does not drink alcohol or use illicit  drugs.  ALLERGIES:  Allergies  Allergen Reactions  . Aspirin Anaphylaxis, Hives, Rash and Other (See Comments)    As well as short of breath; Previously was rushed to hospital-given epinephrine injection.     ROS: A comprehensive review of systems was negative.  HOME MEDS: Current Outpatient Prescriptions  Medication Sig Dispense Refill  . acetaminophen (TYLENOL) 500 MG tablet Take 1,000 mg by mouth every 6 (six) hours as needed. For pain      . atorvastatin (LIPITOR) 10 MG tablet Take 1 tablet (10 mg total) by mouth daily.  90 tablet  3  . B Complex Vitamins (B COMPLEX 1 PO) Take 1 tablet by mouth daily.      Marland Kitchen CALCIUM-VITAMIN D PO Take 1 tablet by mouth 2 (two) times daily.      Marland Kitchen diltiazem (CARDIZEM CD) 240 MG 24 hr capsule Take 240 mg by mouth daily.      . fish oil-omega-3 fatty acids 1000 MG capsule Take 1 g by mouth daily.      Marland Kitchen levothyroxine (SYNTHROID, LEVOTHROID) 88 MCG tablet Take 88 mcg by mouth daily.      Marland Kitchen lisinopril (PRINIVIL,ZESTRIL) 20 MG tablet Take 1 tablet (20 mg total) by mouth 2 (two) times daily.  30 tablet  0  . Multiple Vitamin (MULTIVITAMIN WITH MINERALS) TABS Take 1 tablet by mouth daily.      Marland Kitchen tretinoin (RETIN-A) 0.05 % cream daily.      Marland Kitchen warfarin (COUMADIN) 6 MG tablet Take 1 tablet by mouth daily or as directed by coumadin clinic  30 tablet  5  . metoprolol succinate (TOPROL-XL) 25 MG 24 hr tablet Take 1 tablet (25 mg total) by mouth daily.  30 tablet  6   No current facility-administered medications for this visit.    LABS/IMAGING: No results found for this or any previous visit (from the past 48 hour(s)). No results found.  VITALS: BP 134/88  Pulse 105  Ht 5\' 3"  (1.6 m)  Wt 118 lb 3.2 oz (53.615 kg)  BMI 20.94 kg/m2  EXAM: General appearance: alert and no distress Neck: no adenopathy, no carotid bruit, no JVD, supple, symmetrical, trachea midline and thyroid not enlarged, symmetric, no tenderness/mass/nodules Lungs: clear to  auscultation bilaterally Heart: regular rate and rhythm, S1, S2 normal, no murmur, click, rub or gallop Abdomen: soft, non-tender; bowel sounds normal; no masses,  no organomegaly Extremities: extremities normal, atraumatic, no cyanosis or edema Pulses: 2+ and symmetric Skin: Skin color, texture, turgor normal. No rashes or lesions Neurologic: Grossly normal  EKG: Atrial fibrillation with RVR at 105  ASSESSMENT: 1. Permanent atrial fibrillation-asymptomatic 2. Hypertension-controlled  PLAN: 1.   Mrs. Neidert likely has permanent A. fib at this point. She was evaluated by Dr. Rayann Heman for ablation but not felt to be a good candidate. He felt that she should be rate controlled and continue with chronic anticoagulation. Her INRs have been stable ranging between 2 and 3 and she has this checked  every 6 weeks. Her rate is somewhat higher today and I think she would benefit from additional rate control. I would recommend the addition of low-dose beta blocker, Toprol-XL 25 mg daily. Based on this, I feel we could discontinue her losartan, as she is also on lisinopril. Hopefully this will simplify her regimen more and maintain adequate blood pressure control. I asked that she see Erasmo Downer, our anticoagulation and antihypertensive pharmacist in 1-2 weeks for follow-up to make sure that she has adequate rate control (heartrate less than 100) and blood pressure is still well controlled.  Follow-up with me in 6 months.  Pixie Casino, MD, [facility] Attending Cardiologist [facility] 10/02/2014, 11:18 AM

## 2014-10-06 ENCOUNTER — Other Ambulatory Visit: Payer: Self-pay

## 2014-10-06 MED ORDER — DILTIAZEM HCL ER COATED BEADS 240 MG PO CP24: 240.0000 mg | ORAL_CAPSULE | Freq: Every day | ORAL | Status: DC

## 2014-10-06 MED ORDER — LISINOPRIL 20 MG PO TABS: 20.0000 mg | ORAL_TABLET | Freq: Two times a day (BID) | ORAL | Status: DC

## 2014-10-06 NOTE — Telephone Encounter (Signed)
Rx sent to pharmacy   

## 2014-10-09 DIAGNOSIS — L814 Other melanin hyperpigmentation: Secondary | ICD-10-CM | POA: Diagnosis not present

## 2014-10-09 DIAGNOSIS — D2271 Melanocytic nevi of right lower limb, including hip: Secondary | ICD-10-CM | POA: Diagnosis not present

## 2014-10-09 DIAGNOSIS — Z85820 Personal history of malignant melanoma of skin: Secondary | ICD-10-CM | POA: Diagnosis not present

## 2014-10-09 DIAGNOSIS — D2272 Melanocytic nevi of left lower limb, including hip: Secondary | ICD-10-CM | POA: Diagnosis not present

## 2014-10-09 DIAGNOSIS — L821 Other seborrheic keratosis: Secondary | ICD-10-CM | POA: Diagnosis not present

## 2014-10-09 DIAGNOSIS — Z85828 Personal history of other malignant neoplasm of skin: Secondary | ICD-10-CM | POA: Diagnosis not present

## 2014-10-09 DIAGNOSIS — L820 Inflamed seborrheic keratosis: Secondary | ICD-10-CM | POA: Diagnosis not present

## 2014-10-09 DIAGNOSIS — L570 Actinic keratosis: Secondary | ICD-10-CM | POA: Diagnosis not present

## 2014-10-16 ENCOUNTER — Ambulatory Visit (INDEPENDENT_AMBULATORY_CARE_PROVIDER_SITE_OTHER): Payer: Medicare Other | Admitting: Pharmacist Clinician (PhC)/ Clinical Pharmacy Specialist

## 2014-10-16 VITALS — BP 134/92 | HR 92 | Ht 63.0 in | Wt 118.0 lb

## 2014-10-16 DIAGNOSIS — I10 Essential (primary) hypertension: Secondary | ICD-10-CM | POA: Diagnosis not present

## 2014-10-16 DIAGNOSIS — I4891 Unspecified atrial fibrillation: Secondary | ICD-10-CM | POA: Diagnosis not present

## 2014-10-16 NOTE — Patient Instructions (Signed)
Return for a a follow up appointment in 1 month  Your blood pressure today is 134/92, heart rate 92  Check your blood pressure at home when able and keep record of the readings.  Increase your metoprolol from 25 mg daily to 50 mg daily

## 2014-10-17 ENCOUNTER — Encounter: Payer: Self-pay | Admitting: Pharmacist Clinician (PhC)/ Clinical Pharmacy Specialist

## 2014-10-17 MED ORDER — METOPROLOL SUCCINATE ER 50 MG PO TB24: 50.0000 mg | ORAL_TABLET | Freq: Every day | ORAL | Status: DC

## 2014-10-17 NOTE — Progress Notes (Signed)
23[birth year]31 [name] [birth year] [id number]   HPI:  Kathleen Mcconnell is a 73 y.o. female patient of Dr. Debara Pickett, with a PMH below who presents today for hypertension clinic evaluation.  Kathleen Mcconnell saw Dr. Debara Pickett several weeks ago and was found to be in A fib with mild RVR, rates in low 100s.  She as had paroxsymal atrial fibrillation for some time and had a cardioversion in December 2013 after noting to be SOB with exertion.  Several weeks after the cardioversion she developed symptoms again and was referred to Dr. Rayann Mcconnell.  She was not a candidate for ablation and meds were adjusted for rate control.  Dr. Debara Pickett adjusted her meds at his visit, taking her off losartant (as she was also on lisinopril) and starting her on metoprolol succ 25mg  qd.  She is in for follow up today to be sure her BP is still controlled as well as to determine effect of metoprolol on AF symptoms.    Patient states today that she still has some periods of palpitations and SOB.  They don't bother her much, but she states the frequency is unchanged since switching medications.    Social hx:  No tobacco or alcohol, 1 cup coffee/day, otherwise no caffeine.  Follows low sodium diet.  No regular exercise, but does walk dog daily   Current Outpatient Prescriptions  Medication Sig Dispense Refill  . acetaminophen (TYLENOL) 500 MG tablet Take 1,000 mg by mouth every 6 (six) hours as needed. For pain    . atorvastatin (LIPITOR) 10 MG tablet Take 1 tablet (10 mg total) by mouth daily. 90 tablet 3  . B Complex Vitamins (B COMPLEX 1 PO) Take 1 tablet by mouth daily.    Marland Kitchen CALCIUM-VITAMIN D PO Take 1 tablet by mouth 2 (two) times daily.    Marland Kitchen diltiazem (CARDIZEM CD) 240 MG 24 hr capsule Take 1 capsule (240 mg total) by mouth daily. 30 capsule 10  . fish oil-omega-3 fatty acids 1000 MG capsule Take 1 g by mouth daily.    Marland Kitchen levothyroxine (SYNTHROID, LEVOTHROID) 88 MCG tablet Take 88 mcg by mouth daily.    Marland Kitchen lisinopril  (PRINIVIL,ZESTRIL) 20 MG tablet Take 1 tablet (20 mg total) by mouth 2 (two) times daily. 60 tablet 10  . metoprolol succinate (TOPROL-XL) 25 MG 24 hr tablet Take 1 tablet (25 mg total) by mouth daily. 30 tablet 6  . Multiple Vitamin (MULTIVITAMIN WITH MINERALS) TABS Take 1 tablet by mouth daily.    Marland Kitchen tretinoin (RETIN-A) 0.05 % cream daily.    Marland Kitchen warfarin (COUMADIN) 6 MG tablet Take 1 tablet by mouth daily or as directed by coumadin clinic 30 tablet 5   No current facility-administered medications for this visit.    Allergies  Allergen Reactions  . Aspirin Anaphylaxis, Hives, Rash and Other (See Comments)    As well as short of breath; Previously was rushed to hospital-given epinephrine injection.     Past Medical History  Diagnosis Date  . Hypertension   . Persistent atrial fibrillation     History of; cardioversion in 2004  . Severe tricuspid regurgitation     on echo 02/2011 ?rheumatic heart disease  . Thyroid mass 10/10/2001    benign lesion removed  . Hypothyroidism   . Hyperlipidemia   . Mild aortic insufficiency   . Mitral valve regurgitation   . History of nuclear stress test 03/2012    lexiscan; normal pattern of perfusion; normal, low risk  Blood pressure 134/92, pulse 92, height 5\' 3"  (1.6 m), weight 118 lb (53.524 kg).   ASSESSMENT AND PLAN:  Today her BP looks to be unchanged from last visit.   Her heart rate is still slightly elevated and patient reports no change in frequency of palpitations/SOB.  We will increase her metoprolol to 50mg  once daily and see her back in another month.  She was shown how to check her HR today in the office and I have asked that when she feels SOB or her heart racing, to stop and check her HR.  She is to record these and bring the information when she comes for a follow up in one month.  Tommy Medal PharmD CPP [facility]

## 2014-11-04 ENCOUNTER — Ambulatory Visit (INDEPENDENT_AMBULATORY_CARE_PROVIDER_SITE_OTHER): Payer: Medicare Other | Admitting: *Deleted

## 2014-11-04 DIAGNOSIS — Z7902 Long term (current) use of antithrombotics/antiplatelets: Secondary | ICD-10-CM | POA: Diagnosis not present

## 2014-11-04 DIAGNOSIS — I4891 Unspecified atrial fibrillation: Secondary | ICD-10-CM | POA: Diagnosis not present

## 2014-11-04 LAB — POCT INR: INR: 2.7

## 2014-11-13 ENCOUNTER — Ambulatory Visit (INDEPENDENT_AMBULATORY_CARE_PROVIDER_SITE_OTHER): Payer: Medicare Other | Admitting: Pharmacist Clinician (PhC)/ Clinical Pharmacy Specialist

## 2014-11-13 VITALS — BP 130/74 | HR 84 | Ht 63.0 in | Wt 119.4 lb

## 2014-11-13 DIAGNOSIS — I10 Essential (primary) hypertension: Secondary | ICD-10-CM | POA: Diagnosis not present

## 2014-11-13 NOTE — Patient Instructions (Signed)
Your blood pressure today is 130/74  (goal is <150/90)  Check your heart rate at times.  If you notice it consistently is >100 please call the office    Take your BP meds as follows: continue all current meds

## 2014-11-15 ENCOUNTER — Encounter: Payer: Self-pay | Admitting: Pharmacist Clinician (PhC)/ Clinical Pharmacy Specialist

## 2014-11-15 NOTE — Progress Notes (Signed)
59[birth year]46 Kathleen Mcconnell [birth year] [id number]   HPI:  Kathleen Mcconnell is a 73 y.o. female patient of Dr. Debara Pickett, with a PMH below who presents today for hypertension clinic follow up.     She as had paroxsymal atrial fibrillation for some time and had a cardioversion in December 2013 after noting to be SOB with exertion.  Several weeks after the cardioversion she developed symptoms again and was referred to Dr. Rayann Heman.  She was not a candidate for ablation and meds were adjusted for rate control.  She then saw Dr. Debara Pickett in October and found to be in AF with RVR.  She had not noticed the AF, but did comment on being SOB with activity. He adjusted her meds at that visit, taking her off losartant (as she was also on lisinopril) and starting her on metoprolol succ 25mg  qd.  When I saw her last we adjusted her metoprolol succinate from 25 to 50mg , as her HR was still running in the 90s. Since that change she has not noticed any significant palpitations and has no recent SOB.  Her daughter has checked her HR on a few occasions and found it to always be in the 88s.      Social hx:  No tobacco or alcohol, 1 cup coffee/day, otherwise no caffeine.  Follows low sodium diet.  No regular exercise, but does walk dog daily   Current Outpatient Prescriptions  Medication Sig Dispense Refill  . acetaminophen (TYLENOL) 500 MG tablet Take 1,000 mg by mouth every 6 (six) hours as needed. For pain    . atorvastatin (LIPITOR) 10 MG tablet Take 1 tablet (10 mg total) by mouth daily. 90 tablet 3  . B Complex Vitamins (B COMPLEX 1 PO) Take 1 tablet by mouth daily.    Marland Kitchen CALCIUM-VITAMIN D PO Take 1 tablet by mouth 2 (two) times daily.    Marland Kitchen diltiazem (CARDIZEM CD) 240 MG 24 hr capsule Take 1 capsule (240 mg total) by mouth daily. 30 capsule 10  . fish oil-omega-3 fatty acids 1000 MG capsule Take 1 g by mouth daily.    Marland Kitchen levothyroxine (SYNTHROID, LEVOTHROID) 88 MCG tablet Take 88 mcg by mouth daily.    Marland Kitchen lisinopril  (PRINIVIL,ZESTRIL) 20 MG tablet Take 1 tablet (20 mg total) by mouth 2 (two) times daily. 60 tablet 10  . metoprolol succinate (TOPROL-XL) 50 MG 24 hr tablet Take 1 tablet (50 mg total) by mouth daily. Take with or immediately following a meal. 90 tablet 3  . Multiple Vitamin (MULTIVITAMIN WITH MINERALS) TABS Take 1 tablet by mouth daily.    Marland Kitchen tretinoin (RETIN-A) 0.05 % cream daily.    Marland Kitchen warfarin (COUMADIN) 6 MG tablet Take 1 tablet by mouth daily or as directed by coumadin clinic 30 tablet 5   No current facility-administered medications for this visit.    Allergies  Allergen Reactions  . Aspirin Anaphylaxis, Hives, Rash and Other (See Comments)    As well as short of breath; Previously was rushed to hospital-given epinephrine injection.     Past Medical History  Diagnosis Date  . Hypertension   . Persistent atrial fibrillation     History of; cardioversion in 2004  . Severe tricuspid regurgitation     on echo 02/2011 ?rheumatic heart disease  . Thyroid mass 10/10/2001    benign lesion removed  . Hypothyroidism   . Hyperlipidemia   . Mild aortic insufficiency   . Mitral valve regurgitation   . History  of nuclear stress test 03/2012    lexiscan; normal pattern of perfusion; normal, low risk     Blood pressure 130/74, pulse 68, height 5\' 3"  (1.6 m), weight 119 lb 6.4 oz (54.159 kg).   ASSESSMENT AND PLAN:  [name] is feeing fine today.  Her BP has remained unchanged since stopping the losartan and today her heart rate is better, 84 in the office.  I have asked that she continue to monitor her pulse, especially if she feels any flutters or notices more SOB.  She is to follow up with Dr. Debara Pickett as directed.   Tommy Medal PharmD CPP [facility]

## 2014-12-16 ENCOUNTER — Ambulatory Visit (INDEPENDENT_AMBULATORY_CARE_PROVIDER_SITE_OTHER): Payer: Medicare Other | Admitting: *Deleted

## 2014-12-16 DIAGNOSIS — I480 Paroxysmal atrial fibrillation: Secondary | ICD-10-CM | POA: Diagnosis not present

## 2014-12-16 DIAGNOSIS — Z7902 Long term (current) use of antithrombotics/antiplatelets: Secondary | ICD-10-CM

## 2014-12-16 DIAGNOSIS — I4891 Unspecified atrial fibrillation: Secondary | ICD-10-CM | POA: Diagnosis not present

## 2014-12-16 LAB — POCT INR: INR: 2.4

## 2015-01-27 ENCOUNTER — Ambulatory Visit (INDEPENDENT_AMBULATORY_CARE_PROVIDER_SITE_OTHER): Payer: Medicare Other | Admitting: *Deleted

## 2015-01-27 DIAGNOSIS — Z7902 Long term (current) use of antithrombotics/antiplatelets: Secondary | ICD-10-CM

## 2015-01-27 DIAGNOSIS — I4891 Unspecified atrial fibrillation: Secondary | ICD-10-CM

## 2015-01-27 DIAGNOSIS — I480 Paroxysmal atrial fibrillation: Secondary | ICD-10-CM

## 2015-01-27 LAB — POCT INR: INR: 2.4

## 2015-03-06 ENCOUNTER — Other Ambulatory Visit: Payer: Self-pay | Admitting: Internal Medicine

## 2015-03-10 ENCOUNTER — Ambulatory Visit (INDEPENDENT_AMBULATORY_CARE_PROVIDER_SITE_OTHER): Payer: Medicare Other | Admitting: *Deleted

## 2015-03-10 DIAGNOSIS — Z7902 Long term (current) use of antithrombotics/antiplatelets: Secondary | ICD-10-CM | POA: Diagnosis not present

## 2015-03-10 DIAGNOSIS — I4891 Unspecified atrial fibrillation: Secondary | ICD-10-CM | POA: Diagnosis not present

## 2015-03-10 DIAGNOSIS — I480 Paroxysmal atrial fibrillation: Secondary | ICD-10-CM | POA: Diagnosis not present

## 2015-03-10 LAB — POCT INR: INR: 2.1

## 2015-03-15 ENCOUNTER — Observation Stay (HOSPITAL_COMMUNITY): Payer: Medicare Other

## 2015-03-15 ENCOUNTER — Observation Stay (HOSPITAL_COMMUNITY)
Admission: EM | Admit: 2015-03-15 | Discharge: 2015-03-16 | Disposition: A | Discharge status: Home / Self Care | Payer: Medicare Other | Attending: Internal Medicine | Admitting: Internal Medicine

## 2015-03-15 ENCOUNTER — Encounter (HOSPITAL_COMMUNITY): Payer: Self-pay | Admitting: Cardiology

## 2015-03-15 ENCOUNTER — Emergency Department (HOSPITAL_COMMUNITY): Payer: Medicare Other

## 2015-03-15 DIAGNOSIS — R531 Weakness: Secondary | ICD-10-CM | POA: Diagnosis not present

## 2015-03-15 DIAGNOSIS — I4891 Unspecified atrial fibrillation: Secondary | ICD-10-CM

## 2015-03-15 DIAGNOSIS — I351 Nonrheumatic aortic (valve) insufficiency: Secondary | ICD-10-CM | POA: Insufficient documentation

## 2015-03-15 DIAGNOSIS — E785 Hyperlipidemia, unspecified: Secondary | ICD-10-CM | POA: Diagnosis not present

## 2015-03-15 DIAGNOSIS — E039 Hypothyroidism, unspecified: Secondary | ICD-10-CM | POA: Insufficient documentation

## 2015-03-15 DIAGNOSIS — Z7901 Long term (current) use of anticoagulants: Secondary | ICD-10-CM | POA: Diagnosis not present

## 2015-03-15 DIAGNOSIS — I071 Rheumatic tricuspid insufficiency: Secondary | ICD-10-CM | POA: Insufficient documentation

## 2015-03-15 DIAGNOSIS — I340 Nonrheumatic mitral (valve) insufficiency: Secondary | ICD-10-CM | POA: Insufficient documentation

## 2015-03-15 DIAGNOSIS — I481 Persistent atrial fibrillation: Secondary | ICD-10-CM | POA: Insufficient documentation

## 2015-03-15 DIAGNOSIS — E038 Other specified hypothyroidism: Secondary | ICD-10-CM

## 2015-03-15 DIAGNOSIS — I499 Cardiac arrhythmia, unspecified: Secondary | ICD-10-CM | POA: Insufficient documentation

## 2015-03-15 DIAGNOSIS — Z79899 Other long term (current) drug therapy: Secondary | ICD-10-CM | POA: Insufficient documentation

## 2015-03-15 DIAGNOSIS — R4182 Altered mental status, unspecified: Secondary | ICD-10-CM | POA: Diagnosis not present

## 2015-03-15 DIAGNOSIS — I10 Essential (primary) hypertension: Secondary | ICD-10-CM | POA: Diagnosis not present

## 2015-03-15 DIAGNOSIS — R569 Unspecified convulsions: Secondary | ICD-10-CM | POA: Diagnosis not present

## 2015-03-15 DIAGNOSIS — G459 Transient cerebral ischemic attack, unspecified: Secondary | ICD-10-CM | POA: Diagnosis not present

## 2015-03-15 DIAGNOSIS — R42 Dizziness and giddiness: Secondary | ICD-10-CM | POA: Diagnosis not present

## 2015-03-15 LAB — LIPID PANEL
Cholesterol: 168 mg/dL (ref 0–200)
HDL: 86 mg/dL (ref >39)
LDL Cholesterol: 68 mg/dL (ref 0–99)
Total CHOL/HDL Ratio: 2 RATIO
Triglycerides: 68 mg/dL (ref <150)
VLDL: 14 mg/dL (ref 0–40)

## 2015-03-15 LAB — DIFFERENTIAL
Basophils Absolute: 0.1 10*3/uL (ref 0.0–0.1)
Basophils Relative: 1 % (ref 0–1)
Eosinophils Absolute: 0.1 10*3/uL (ref 0.0–0.7)
Eosinophils Relative: 1 % (ref 0–5)
Lymphocytes Relative: 39 % (ref 12–46)
Lymphs Abs: 2.7 10*3/uL (ref 0.7–4.0)
Monocytes Absolute: 0.7 10*3/uL (ref 0.1–1.0)
Monocytes Relative: 10 % (ref 3–12)
Neutro Abs: 3.4 10*3/uL (ref 1.7–7.7)
Neutrophils Relative %: 49 % (ref 43–77)

## 2015-03-15 LAB — RAPID URINE DRUG SCREEN, HOSP PERFORMED
Amphetamines: NOT DETECTED
Barbiturates: NOT DETECTED
Benzodiazepines: NOT DETECTED
Cocaine: NOT DETECTED
Opiates: NOT DETECTED
Tetrahydrocannabinol: NOT DETECTED

## 2015-03-15 LAB — COMPREHENSIVE METABOLIC PANEL
ALT: 33 U/L (ref 0–35)
AST: 48 U/L — ABNORMAL HIGH (ref 0–37)
Albumin: 4.8 g/dL (ref 3.5–5.2)
Alkaline Phosphatase: 72 U/L (ref 39–117)
Anion gap: 11 (ref 5–15)
BUN: 18 mg/dL (ref 6–23)
CO2: 25 mmol/L (ref 19–32)
Calcium: 9.7 mg/dL (ref 8.4–10.5)
Chloride: 103 mmol/L (ref 96–112)
Creatinine, Ser: 0.73 mg/dL (ref 0.50–1.10)
GFR calc Af Amer: >90 mL/min (ref >90)
GFR calc non Af Amer: 82 mL/min — ABNORMAL LOW (ref >90)
Glucose, Bld: 81 mg/dL (ref 70–99)
Potassium: 4.3 mmol/L (ref 3.5–5.1)
Sodium: 139 mmol/L (ref 135–145)
Total Bilirubin: 1.2 mg/dL (ref 0.3–1.2)
Total Protein: 8.1 g/dL (ref 6.0–8.3)

## 2015-03-15 LAB — URINALYSIS, ROUTINE W REFLEX MICROSCOPIC
Bilirubin Urine: NEGATIVE
Glucose, UA: NEGATIVE mg/dL
Leukocytes, UA: NEGATIVE
Nitrite: NEGATIVE
Protein, ur: NEGATIVE mg/dL
Specific Gravity, Urine: 1.01 (ref 1.005–1.030)
Urobilinogen, UA: 0.2 mg/dL (ref 0.0–1.0)
pH: 5.5 (ref 5.0–8.0)

## 2015-03-15 LAB — URINE MICROSCOPIC-ADD ON

## 2015-03-15 LAB — CBC
HCT: 41.5 % (ref 36.0–46.0)
Hemoglobin: 14.4 g/dL (ref 12.0–15.0)
MCH: 32.5 pg (ref 26.0–34.0)
MCHC: 34.7 g/dL (ref 30.0–36.0)
MCV: 93.7 fL (ref 78.0–100.0)
Platelets: 204 10*3/uL (ref 150–400)
RBC: 4.43 MIL/uL (ref 3.87–5.11)
RDW: 13.4 % (ref 11.5–15.5)
WBC: 6.8 10*3/uL (ref 4.0–10.5)

## 2015-03-15 LAB — ETHANOL: Alcohol, Ethyl (B): <5 mg/dL (ref 0–9)

## 2015-03-15 LAB — PROTIME-INR
INR: 1.92 — ABNORMAL HIGH (ref 0.00–1.49)
Prothrombin Time: 22.2 seconds — ABNORMAL HIGH (ref 11.6–15.2)

## 2015-03-15 LAB — TROPONIN I
Troponin I: <0.03 ng/mL (ref <0.031)
Troponin I: <0.03 ng/mL (ref <0.031)

## 2015-03-15 LAB — TSH: TSH: 0.878 u[IU]/mL (ref 0.350–4.500)

## 2015-03-15 LAB — APTT: aPTT: 38 seconds — ABNORMAL HIGH (ref 24–37)

## 2015-03-15 MED ORDER — GADOBENATE DIMEGLUMINE 529 MG/ML IV SOLN
10.0000 mL | Freq: Once | INTRAVENOUS | Status: AC | PRN
  Administered 2015-03-15: 10 mL via INTRAVENOUS

## 2015-03-15 MED ORDER — LISINOPRIL 10 MG PO TABS: 20.0000 mg | ORAL_TABLET | Freq: Two times a day (BID) | ORAL | Status: DC

## 2015-03-15 MED ORDER — LEVOTHYROXINE SODIUM 88 MCG PO TABS
88.0000 ug | ORAL_TABLET | Freq: Every day | ORAL | Status: DC
  Administered 2015-03-16: 88 ug via ORAL
  Filled 2015-03-15: qty 1

## 2015-03-15 MED ORDER — WARFARIN SODIUM 5 MG PO TABS: 6.0000 mg | ORAL_TABLET | Freq: Once | ORAL | Status: DC

## 2015-03-15 MED ORDER — WARFARIN - PHARMACIST DOSING INPATIENT: Status: DC

## 2015-03-15 MED ORDER — DILTIAZEM HCL ER COATED BEADS 240 MG PO CP24
240.0000 mg | ORAL_CAPSULE | Freq: Every day | ORAL | Status: DC
  Administered 2015-03-15 – 2015-03-16 (×2): 240 mg via ORAL
  Filled 2015-03-15 (×2): qty 1

## 2015-03-15 MED ORDER — ATORVASTATIN CALCIUM 10 MG PO TABS
10.0000 mg | ORAL_TABLET | Freq: Every day | ORAL | Status: DC
  Administered 2015-03-15: 10 mg via ORAL
  Filled 2015-03-15: qty 1

## 2015-03-15 MED ORDER — TRAZODONE HCL 50 MG PO TABS
50.0000 mg | ORAL_TABLET | Freq: Every evening | ORAL | Status: DC | PRN
  Administered 2015-03-15: 50 mg via ORAL
  Filled 2015-03-15: qty 1

## 2015-03-15 MED ORDER — STROKE: EARLY STAGES OF RECOVERY BOOK
Freq: Once | Status: AC
  Administered 2015-03-15: 1
  Filled 2015-03-15: qty 1

## 2015-03-15 MED ORDER — ACETAMINOPHEN 325 MG PO TABS: 650.0000 mg | ORAL_TABLET | ORAL | Status: DC | PRN

## 2015-03-15 NOTE — Consult Note (Signed)
[place] A. Merlene Laughter, MD     [email or web address]          Kathleen Mcconnell is an 74 y.o. female.   ASSESSMENT/PLAN: 1. Acute neurological events suspicious for possible complex partial seizures or TIA. At this point in time is unclear which of these is the culprit.  2. Atrial fibrillation. 3. Hypertension  RECOMMENDATION: Continue with warfarin therapy. EEG.  The patient is 74 year old white female who became acutely disoriented and just wasn't feeling right. She did have some dizziness but no loss of consciousness. No focal neurological symptoms are reported. She did call her daughter on the phone by down in the number. The patient was in the grocery store shopping. The daughter reported patient started slightly dysarthric and frantic to the point of near crying. The spell lasted for a few minutes. Again, there is no focal numbness, weakness, tonic-clonic activity. The patient to call her husband to notice that she had another event. This time she did not have any dizziness but was again somewhat disoriented. The second event lasted for a few minutes slightly initial event. The patient is on warfarin therapy. She has been on this for atrial fibrillation. She apparently tried Xarelto in the past but it did not work for her. She currently feels back to baseline. There are no reports of chest pain, shortness of breath, GI GU symptoms. The review of systems otherwise negative.  GENERAL: This is a thin pleasant female who appears about 10 years about than the stated age.  HEENT: Supple. Atraumatic normocephalic.   ABDOMEN: soft  EXTREMITIES: No edema   BACK: Normal.  SKIN: Normal by inspection.    MENTAL STATUS: Alert and oriented person, place year, month and date. H is still appropriately. Speech, language and cognition are generally intact. Judgment and insight normal.   CRANIAL NERVES: Pupils are equal, round and reactive to light and accommodation; extra ocular  movements are full, there is no significant nystagmus; visual fields are full; upper and lower facial muscles are normal in strength and symmetric, there is no flattening of the nasolabial folds; tongue is midline; uvula is midline; shoulder elevation is normal.  MOTOR: Normal tone, bulk and strength - in the upper extremities; no pronator drift. Left lower extremity slightly weak graded as 4+/5 both proximally and distally. Bulk and tone are normal. The right leg shows normal tone, bulk and strength. There is no drift of the lower extremities.  COORDINATION: Left finger to nose is normal, right finger to nose is normal, No rest tremor; no intention tremor; no postural tremor; no bradykinesia.  REFLEXES: Deep tendon reflexes are symmetrical and normal. Babinski reflexes are flexor bilaterally.   SENSATION: Normal to light touch.  NIH stroke scale 0   The patient's imaging results are reviewed in person. The MRI shows nothing acute and no increased signal noted on diffusion imaging. There are moderate scattered deep white matter and periventricular leukoencephalopathy indicating chronic ischemic changes. There is moderate global atrophy. MRA shows no intracranial occlusive disease.   Blood pressure 125/79, pulse 69, temperature 98 F (36.7 C), temperature source Oral, resp. rate 20, height '5\' 3"'  (1.6 m), weight 53.524 kg (118 lb), SpO2 97 %.  Past Medical History  Diagnosis Date  . Hypertension   . Persistent atrial fibrillation     History of; cardioversion in 2004  . Severe tricuspid regurgitation     on echo 02/2011 ?rheumatic heart disease  . Thyroid mass 10/10/2001    benign lesion removed  .  Hypothyroidism   . Hyperlipidemia   . Mild aortic insufficiency   . Mitral valve regurgitation   . History of nuclear stress test 03/2012    lexiscan; normal pattern of perfusion; normal, low risk     Past Surgical History  Procedure Laterality Date  . Thyroidectomy  10/10/2001  .  Cardioversion N/A 01/20/2013    Procedure: CARDIOVERSION;  Surgeon: Pixie Casino, MD;  Location: [facility] ENDOSCOPY;  Service: Cardiovascular;  Laterality: N/A;  . Transthoracic echocardiogram  02/2011    EF=>55%; mild MR; mod-severe TR & elevated RV systolic pressure, mild pulm HTN; aortic valve mildly sclerotic with mild regurg  . Cardiac catheterization  0300    normal systolic function, near normal coronaries     Family History  Problem Relation Age of Onset  . Heart disease Father     also MI  . Heart disease Brother     x 3, one deceased age 11  . Heart disease Sister     Social History:  reports that she has never smoked. She has never used smokeless tobacco. She reports that she does not drink alcohol or use illicit drugs.  Allergies:  Allergies  Allergen Reactions  . Aspirin Anaphylaxis, Hives, Rash and Other (See Comments)    As well as short of breath; Previously was rushed to hospital-given epinephrine injection.     Medications: Prior to Admission medications   Medication Sig Start Date End Date Taking? Authorizing Provider  acetaminophen (TYLENOL) 500 MG tablet Take 1,000 mg by mouth every 6 (six) hours as needed. For pain   Yes Historical Provider, MD  atorvastatin (LIPITOR) 10 MG tablet Take 1 tablet (10 mg total) by mouth daily. 09/07/14  Yes Pixie Casino, MD  B Complex Vitamins (B COMPLEX 1 PO) Take 1 tablet by mouth daily.   Yes Historical Provider, MD  CALCIUM-VITAMIN D PO Take 1 tablet by mouth 2 (two) times daily.   Yes Historical Provider, MD  diltiazem (CARDIZEM CD) 240 MG 24 hr capsule Take 1 capsule (240 mg total) by mouth daily. 10/06/14  Yes Pixie Casino, MD  fish oil-omega-3 fatty acids 1000 MG capsule Take 1 g by mouth daily.   Yes Historical Provider, MD  levothyroxine (SYNTHROID, LEVOTHROID) 88 MCG tablet Take 88 mcg by mouth daily.   Yes Historical Provider, MD  lisinopril (PRINIVIL,ZESTRIL) 20 MG tablet Take 1 tablet (20 mg total) by mouth 2 (two)  times daily. 10/06/14  Yes Pixie Casino, MD  metoprolol succinate (TOPROL-XL) 50 MG 24 hr tablet Take 1 tablet (50 mg total) by mouth daily. Take with or immediately following a meal. 10/17/14  Yes Pixie Casino, MD  Multiple Vitamin (MULTIVITAMIN WITH MINERALS) TABS Take 1 tablet by mouth daily.   Yes Historical Provider, MD  tretinoin (RETIN-A) 0.05 % cream Apply 1 application topically daily.  09/30/14  Yes Historical Provider, MD  warfarin (COUMADIN) 6 MG tablet TAKE 1 TABLET BY MOUTH ONCE DAILY OR AS DIRECTED BY COUMADIN CLINIC. 03/09/15  Yes Pixie Casino, MD    Scheduled Meds: . atorvastatin  10 mg Oral q1800  . diltiazem  240 mg Oral Daily  . [START ON 03/16/2015] levothyroxine  88 mcg Oral QAC breakfast  . warfarin  6 mg Oral Once  . [START ON 03/16/2015] Warfarin - Pharmacist Dosing Inpatient   Does not apply Q24H   Continuous Infusions:  PRN Meds:.acetaminophen, traZODone     Results for orders placed or performed during the hospital encounter  of 03/15/15 (from the past 48 hour(s))  Fasting lipid panel     Status: None   Collection Time: 03/15/15 11:15 AM  Result Value Ref Range   Cholesterol 168 0 - 200 mg/dL   Triglycerides 68 <150 mg/dL   HDL 86 >39 mg/dL   Total CHOL/HDL Ratio 2.0 RATIO   VLDL 14 0 - 40 mg/dL   LDL Cholesterol 68 0 - 99 mg/dL    Comment:        Total Cholesterol/HDL:CHD Risk Coronary Heart Disease Risk Table                     Men   Women  1/2 Average Risk   3.4   3.3  Average Risk       5.0   4.4  2 X Average Risk   9.6   7.1  3 X Average Risk  23.4   11.0        Use the calculated Patient Ratio above and the CHD Risk Table to determine the patient's CHD Risk.        ATP III CLASSIFICATION (LDL):  <100     mg/dL   Optimal  100-129  mg/dL   Near or Above                    Optimal  130-159  mg/dL   Borderline  160-189  mg/dL   High  >190     mg/dL   Very High   Ethanol     Status: None   Collection Time: 03/15/15 11:35 AM  Result  Value Ref Range   Alcohol, Ethyl (B) <5 0 - 9 mg/dL    Comment:        LOWEST DETECTABLE LIMIT FOR SERUM ALCOHOL IS 11 mg/dL FOR MEDICAL PURPOSES ONLY   Protime-INR     Status: Abnormal   Collection Time: 03/15/15 11:35 AM  Result Value Ref Range   Prothrombin Time 22.2 (H) 11.6 - 15.2 seconds   INR 1.92 (H) 0.00 - 1.49  APTT     Status: Abnormal   Collection Time: 03/15/15 11:35 AM  Result Value Ref Range   aPTT 38 (H) 24 - 37 seconds    Comment:        IF BASELINE aPTT IS ELEVATED, SUGGEST PATIENT RISK ASSESSMENT BE USED TO DETERMINE APPROPRIATE ANTICOAGULANT THERAPY.   CBC     Status: None   Collection Time: 03/15/15 11:35 AM  Result Value Ref Range   WBC 6.8 4.0 - 10.5 K/uL   RBC 4.43 3.87 - 5.11 MIL/uL   Hemoglobin 14.4 12.0 - 15.0 g/dL   HCT 41.5 36.0 - 46.0 %   MCV 93.7 78.0 - 100.0 fL   MCH 32.5 26.0 - 34.0 pg   MCHC 34.7 30.0 - 36.0 g/dL   RDW 13.4 11.5 - 15.5 %   Platelets 204 150 - 400 K/uL  Differential     Status: None   Collection Time: 03/15/15 11:35 AM  Result Value Ref Range   Neutrophils Relative % 49 43 - 77 %   Neutro Abs 3.4 1.7 - 7.7 K/uL   Lymphocytes Relative 39 12 - 46 %   Lymphs Abs 2.7 0.7 - 4.0 K/uL   Monocytes Relative 10 3 - 12 %   Monocytes Absolute 0.7 0.1 - 1.0 K/uL   Eosinophils Relative 1 0 - 5 %   Eosinophils Absolute 0.1 0.0 - 0.7 K/uL   Basophils Relative  1 0 - 1 %   Basophils Absolute 0.1 0.0 - 0.1 K/uL  Comprehensive metabolic panel     Status: Abnormal   Collection Time: 03/15/15 11:35 AM  Result Value Ref Range   Sodium 139 135 - 145 mmol/L   Potassium 4.3 3.5 - 5.1 mmol/L   Chloride 103 96 - 112 mmol/L   CO2 25 19 - 32 mmol/L   Glucose, Bld 81 70 - 99 mg/dL   BUN 18 6 - 23 mg/dL   Creatinine, Ser 0.73 0.50 - 1.10 mg/dL   Calcium 9.7 8.4 - 10.5 mg/dL   Total Protein 8.1 6.0 - 8.3 g/dL   Albumin 4.8 3.5 - 5.2 g/dL   AST 48 (H) 0 - 37 U/L   ALT 33 0 - 35 U/L   Alkaline Phosphatase 72 39 - 117 U/L   Total Bilirubin  1.2 0.3 - 1.2 mg/dL   GFR calc non Af Amer 82 (L) >90 mL/min   GFR calc Af Amer >90 >90 mL/min    Comment: (NOTE) The eGFR has been calculated using the CKD EPI equation. This calculation has not been validated in all clinical situations. eGFR's persistently <90 mL/min signify possible Chronic Kidney Disease.    Anion gap 11 5 - 15  Troponin I     Status: None   Collection Time: 03/15/15 11:35 AM  Result Value Ref Range   Troponin I <0.03 <0.031 ng/mL    Comment:        NO INDICATION OF MYOCARDIAL INJURY.   Urine Drug Screen     Status: None   Collection Time: 03/15/15  2:02 PM  Result Value Ref Range   Opiates NONE DETECTED NONE DETECTED   Cocaine NONE DETECTED NONE DETECTED   Benzodiazepines NONE DETECTED NONE DETECTED   Amphetamines NONE DETECTED NONE DETECTED   Tetrahydrocannabinol NONE DETECTED NONE DETECTED   Barbiturates NONE DETECTED NONE DETECTED    Comment:        DRUG SCREEN FOR MEDICAL PURPOSES ONLY.  IF CONFIRMATION IS NEEDED FOR ANY PURPOSE, NOTIFY LAB WITHIN 5 DAYS.        LOWEST DETECTABLE LIMITS FOR URINE DRUG SCREEN Drug Class       Cutoff (ng/mL) Amphetamine      1000 Barbiturate      200 Benzodiazepine   893 Tricyclics       734 Opiates          300 Cocaine          300 THC              50   Urinalysis, Routine w reflex microscopic     Status: Abnormal   Collection Time: 03/15/15  2:02 PM  Result Value Ref Range   Color, Urine YELLOW YELLOW   APPearance CLEAR CLEAR   Specific Gravity, Urine 1.010 1.005 - 1.030   pH 5.5 5.0 - 8.0   Glucose, UA NEGATIVE NEGATIVE mg/dL   Hgb urine dipstick TRACE (A) NEGATIVE   Bilirubin Urine NEGATIVE NEGATIVE   Ketones, ur TRACE (A) NEGATIVE mg/dL   Protein, ur NEGATIVE NEGATIVE mg/dL   Urobilinogen, UA 0.2 0.0 - 1.0 mg/dL   Nitrite NEGATIVE NEGATIVE   Leukocytes, UA NEGATIVE NEGATIVE  Urine microscopic-add on     Status: None   Collection Time: 03/15/15  2:02 PM  Result Value Ref Range   Squamous  Epithelial / LPF RARE RARE   WBC, UA 0-2 <3 WBC/hpf   RBC / HPF 3-6 <3  RBC/hpf   Bacteria, UA RARE RARE  TSH     Status: None   Collection Time: 03/15/15  5:31 PM  Result Value Ref Range   TSH 0.878 0.350 - 4.500 uIU/mL  Troponin I     Status: None   Collection Time: 03/15/15  5:31 PM  Result Value Ref Range   Troponin I <0.03 <0.031 ng/mL    Comment:        NO INDICATION OF MYOCARDIAL INJURY.     Studies/Results:     Alyzae Hawkey A. Merlene Laughter, M.D.  Diplomate, Tax adviser of Psychiatry and Neurology ( Neurology). 03/15/2015, 9:34 PM

## 2015-03-15 NOTE — ED Provider Notes (Signed)
CSN: [id number]     Arrival date & time 03/15/15  1051 History  This chart was scribed for Merryl Hacker, MD by Donato Schultz, ED Scribe. This patient was seen in room APA03/APA03 and the patient's care was started at 11:02 AM.    Chief Complaint  Patient presents with  . Blurred Vision  . Altered Mental Status    HPI HPI Comments: Kathleen Mcconnell is a 74 y.o. female with a history of A-Fib and mitral valve regurgitation who presents to the Emergency Department complaining of blurred vision that occurred an hour ago today and lasted about 15 minutes.  She states that the blurred vision occurred in both eyes and throughout her entire visual field.  She felt slightly confused and weak during the episode but denies any one-sided weakness.  Denies HA.  She states that her symptoms have resolved and she is nearly at baseline.  She takes Aspirin, Coumadin, and Cardizem regularly to manage her A-Fib.  She does not have a history of MI, TIA, or heart disease.  She denies chest pain, numbness/tingling, and SOB as associated symptoms.     Past Medical History  Diagnosis Date  . Hypertension   . Persistent atrial fibrillation     History of; cardioversion in 2004  . Severe tricuspid regurgitation     on echo 02/2011 ?rheumatic heart disease  . Thyroid mass 10/10/2001    benign lesion removed  . Hypothyroidism   . Hyperlipidemia   . Mild aortic insufficiency   . Mitral valve regurgitation   . History of nuclear stress test 03/2012    lexiscan; normal pattern of perfusion; normal, low risk    Past Surgical History  Procedure Laterality Date  . Thyroidectomy  10/10/2001  . Cardioversion N/A 01/20/2013    Procedure: CARDIOVERSION;  Surgeon: Pixie Casino, MD;  Location: [facility] ENDOSCOPY;  Service: Cardiovascular;  Laterality: N/A;  . Transthoracic echocardiogram  02/2011    EF=>55%; mild MR; mod-severe TR & elevated RV systolic pressure, mild pulm HTN; aortic valve mildly sclerotic with mild regurg   . Cardiac catheterization  9774    normal systolic function, near normal coronaries    Family History  Problem Relation Age of Onset  . Heart disease Father     also MI  . Heart disease Brother     x 3, one deceased age 17  . Heart disease Sister    History  Substance Use Topics  . Smoking status: Never Smoker   . Smokeless tobacco: Never Used  . Alcohol Use: No   OB History    No data available     Review of Systems  Constitutional: Negative for fever.  Eyes: Positive for visual disturbance. Negative for pain.  Respiratory: Negative for cough, chest tightness and shortness of breath.   Cardiovascular: Negative for chest pain.  Gastrointestinal: Negative for nausea, vomiting and abdominal pain.  Genitourinary: Negative for dysuria.  Musculoskeletal: Negative for back pain.  Neurological: Positive for weakness. Negative for dizziness, syncope and headaches.  Psychiatric/Behavioral: Negative for confusion.  All other systems reviewed and are negative.     Allergies  Aspirin  Home Medications   Prior to Admission medications   Medication Sig Start Date End Date Taking? Authorizing Provider  acetaminophen (TYLENOL) 500 MG tablet Take 1,000 mg by mouth every 6 (six) hours as needed. For pain   Yes Historical Provider, MD  atorvastatin (LIPITOR) 10 MG tablet Take 1 tablet (10 mg total) by mouth daily. 09/07/14  Yes Pixie Casino, MD  B Complex Vitamins (B COMPLEX 1 PO) Take 1 tablet by mouth daily.   Yes Historical Provider, MD  CALCIUM-VITAMIN D PO Take 1 tablet by mouth 2 (two) times daily.   Yes Historical Provider, MD  diltiazem (CARDIZEM CD) 240 MG 24 hr capsule Take 1 capsule (240 mg total) by mouth daily. 10/06/14  Yes Pixie Casino, MD  fish oil-omega-3 fatty acids 1000 MG capsule Take 1 g by mouth daily.   Yes Historical Provider, MD  levothyroxine (SYNTHROID, LEVOTHROID) 88 MCG tablet Take 88 mcg by mouth daily.   Yes Historical Provider, MD  lisinopril  (PRINIVIL,ZESTRIL) 20 MG tablet Take 1 tablet (20 mg total) by mouth 2 (two) times daily. 10/06/14  Yes Pixie Casino, MD  metoprolol succinate (TOPROL-XL) 50 MG 24 hr tablet Take 1 tablet (50 mg total) by mouth daily. Take with or immediately following a meal. 10/17/14  Yes Pixie Casino, MD  Multiple Vitamin (MULTIVITAMIN WITH MINERALS) TABS Take 1 tablet by mouth daily.   Yes Historical Provider, MD  tretinoin (RETIN-A) 0.05 % cream Apply 1 application topically daily.  09/30/14  Yes Historical Provider, MD  warfarin (COUMADIN) 6 MG tablet TAKE 1 TABLET BY MOUTH ONCE DAILY OR AS DIRECTED BY COUMADIN CLINIC. 03/09/15  Yes Pixie Casino, MD   BP 140/86 mmHg  Pulse 77  Temp(Src) 98.1 F (36.7 C) (Oral)  Resp 20  Ht 5\' 3"  (1.6 m)  Wt 118 lb (53.524 kg)  BMI 20.91 kg/m2  SpO2 99% Physical Exam  Constitutional: She is oriented to person, place, and time. She appears well-developed and well-nourished. No distress.  Appears younger than stated age  HENT:  Head: Normocephalic and atraumatic.  Mouth/Throat: Oropharynx is clear and moist.  Eyes: Conjunctivae and EOM are normal. Pupils are equal, round, and reactive to light.  Visual fields intact  Neck: Neck supple.  Cardiovascular: Normal rate and normal heart sounds.   Irregular rhythm  Pulmonary/Chest: Effort normal and breath sounds normal. No respiratory distress. She has no wheezes.  Abdominal: Soft. Bowel sounds are normal. There is no tenderness. There is no rebound.  Neurological: She is alert and oriented to person, place, and time.  Cranial nerves II through XII intact, fluent speech, 5 out of 5 strength in all 4 extremities, normal reflexes, visual fields intact  Skin: Skin is warm and dry.  Psychiatric: She has a normal mood and affect.  Nursing note and vitals reviewed.   ED Course  Procedures (including critical care time) Labs Review Labs Reviewed  PROTIME-INR - Abnormal; Notable for the following:    Prothrombin  Time 22.2 (*)    INR 1.92 (*)    All other components within normal limits  APTT - Abnormal; Notable for the following:    aPTT 38 (*)    All other components within normal limits  COMPREHENSIVE METABOLIC PANEL - Abnormal; Notable for the following:    AST 48 (*)    GFR calc non Af Amer 82 (*)    All other components within normal limits  ETHANOL  CBC  DIFFERENTIAL  TROPONIN I  URINE RAPID DRUG SCREEN (HOSP PERFORMED)  URINALYSIS, ROUTINE W REFLEX MICROSCOPIC    Imaging Review Ct Head Wo Contrast  03/15/2015   CLINICAL DATA:  10 minute episode blurred vision and weakness at 10:00 a.m. today.  EXAM: CT HEAD WITHOUT CONTRAST  TECHNIQUE: Contiguous axial images were obtained from the base of the skull through the  vertex without intravenous contrast.  COMPARISON:  None.  FINDINGS: The brain appears normal without evidence of hemorrhage, infarct, mass lesion, mass effect, midline shift or abnormal extra-axial fluid collection. No hydrocephalus or pneumocephalus. The calvarium is intact.  IMPRESSION: Negative head CT.   Electronically Signed   By: Inge Rise M.D.   On: 03/15/2015 12:10     EKG Interpretation   Date/Time:  Monday March 15 2015 11:07:07 EDT Ventricular Rate:  88 PR Interval:    QRS Duration: 78 QT Interval:  403 QTC Calculation: 488 R Axis:   74 Text Interpretation:  Atrial fibrillation LVH with secondary  repolarization abnormality Anterior Q waves, possibly due to LVH ST  depression, inferior(new)  and laterally (unchanged) Confirmed by Angeli Demilio   MD, Detravion Tester ([id number]) on 03/15/2015 11:48:21 AM      MDM   Final diagnoses:  Transient cerebral ischemia, unspecified transient cerebral ischemia type    Patient presents with self-limited episode of blurry vision. Currently back to baseline. Nonfocal on exam. She is on Coumadin for atrial fibrillation. No history of stroke. Stroke workup initiated and largely reassuring. INR 1.9. Discuss with Dr. Merlene Laughter.  Will  admit for TIA workup including MRA of the head and neck.  I personally performed the services described in this documentation, which was scribed in my presence. The recorded information has been reviewed and is accurate.     Merryl Hacker, MD 03/15/15 [phone]

## 2015-03-15 NOTE — Progress Notes (Signed)
Requested medication for sleep, stating she is a light sleeper.  Trazodone 50 mg given.

## 2015-03-15 NOTE — H&P (Signed)
[facility] History and Physical  Kathleen Mcconnell STM:[id number] DOB: [birth year] DOA: 03/15/2015  Referring physician:  PCP: Rocky Morel, MD   Chief Complaint: Blurred vision  HPI: Kathleen Mcconnell is a very pleasant 74 y.o. female with a past medical history that includes A. fib and mitral valve regurg on Coumadin, hypertension, hypothyroidism, hyperlipidemia presents to the emergency department with the chief complaint of blurred vision and intermittent confusion. Symptoms resolved by the time she got to the emergency department. Being admitted for TIA workup She reports she was in the grocery store this morning and all the sudden developed blurred vision. She reports the vision in both eyes was blurry. She also states that "I wasn't quite myself". She denies lightheadedness and dizziness syncope or near-syncope but "I did not feel right". She denies headache chest pain palpitations nausea vomiting diaphoresis shortness of breath abdominal pain. She reports she called her daughter who called the patient's husband and he went to drop at the grocery store. He reports there was no slurred speech weakness of extremities unsteady gait. She got home will car thinking she would drive home and she states "I had another little spell" and therefore should her husband brought her to the emergency department. Up includes comprehensive metabolic panel is unremarkable complete blood count is unremarkable INR 1.9 CT of the head negative for any acute abnormalities. He is afebrile hemodynamically stable and not hypoxic. The emergency department is reported she passed a bedside swallow eval.   Review of Systems:  And point review of systems completed and all systems are negative except as indicated in the history of present illness   Past Medical History  Diagnosis Date  . Hypertension   . Persistent atrial fibrillation     History of; cardioversion in 2004  . Severe tricuspid regurgitation      on echo 02/2011 ?rheumatic heart disease  . Thyroid mass 10/10/2001    benign lesion removed  . Hypothyroidism   . Hyperlipidemia   . Mild aortic insufficiency   . Mitral valve regurgitation   . History of nuclear stress test 03/2012    lexiscan; normal pattern of perfusion; normal, low risk    Past Surgical History  Procedure Laterality Date  . Thyroidectomy  10/10/2001  . Cardioversion N/A 01/20/2013    Procedure: CARDIOVERSION;  Surgeon: Pixie Casino, MD;  Location: [facility] ENDOSCOPY;  Service: Cardiovascular;  Laterality: N/A;  . Transthoracic echocardiogram  02/2011    EF=>55%; mild MR; mod-severe TR & elevated RV systolic pressure, mild pulm HTN; aortic valve mildly sclerotic with mild regurg  . Cardiac catheterization  8921    normal systolic function, near normal coronaries    Social History:  reports that she has never smoked. She has never used smokeless tobacco. She reports that she does not drink alcohol or use illicit drugs. She is married she lives at home with her husband she is a retired Psychologist, sport and exercise she is independent with ADLs Allergies  Allergen Reactions  . Aspirin Anaphylaxis, Hives, Rash and Other (See Comments)    As well as short of breath; Previously was rushed to hospital-given epinephrine injection.     Family History  Problem Relation Age of Onset  . Heart disease Father     also MI  . Heart disease Brother     x 3, one deceased age 38  . Heart disease Sister    strong family history of heart disease. Father deceased in his 82s from heart attack she has  3 brothers all of whom have had a heart attack 1 died at age 20 a sister who's had several CABGs. No stroke no cancer  Prior to Admission medications   Medication Sig Start Date End Date Taking? Authorizing Provider  acetaminophen (TYLENOL) 500 MG tablet Take 1,000 mg by mouth every 6 (six) hours as needed. For pain   Yes Historical Provider, MD  atorvastatin (LIPITOR) 10 MG tablet Take 1 tablet (10 mg  total) by mouth daily. 09/07/14  Yes Pixie Casino, MD  B Complex Vitamins (B COMPLEX 1 PO) Take 1 tablet by mouth daily.   Yes Historical Provider, MD  CALCIUM-VITAMIN D PO Take 1 tablet by mouth 2 (two) times daily.   Yes Historical Provider, MD  diltiazem (CARDIZEM CD) 240 MG 24 hr capsule Take 1 capsule (240 mg total) by mouth daily. 10/06/14  Yes Pixie Casino, MD  fish oil-omega-3 fatty acids 1000 MG capsule Take 1 g by mouth daily.   Yes Historical Provider, MD  levothyroxine (SYNTHROID, LEVOTHROID) 88 MCG tablet Take 88 mcg by mouth daily.   Yes Historical Provider, MD  lisinopril (PRINIVIL,ZESTRIL) 20 MG tablet Take 1 tablet (20 mg total) by mouth 2 (two) times daily. 10/06/14  Yes Pixie Casino, MD  metoprolol succinate (TOPROL-XL) 50 MG 24 hr tablet Take 1 tablet (50 mg total) by mouth daily. Take with or immediately following a meal. 10/17/14  Yes Pixie Casino, MD  Multiple Vitamin (MULTIVITAMIN WITH MINERALS) TABS Take 1 tablet by mouth daily.   Yes Historical Provider, MD  tretinoin (RETIN-A) 0.05 % cream Apply 1 application topically daily.  09/30/14  Yes Historical Provider, MD  warfarin (COUMADIN) 6 MG tablet TAKE 1 TABLET BY MOUTH ONCE DAILY OR AS DIRECTED BY COUMADIN CLINIC. 03/09/15  Yes Pixie Casino, MD   Physical Exam: Filed Vitals:   03/15/15 1106 03/15/15 1140 03/15/15 1330 03/15/15 1400  BP: 162/90  140/86 152/87  Pulse: 72  77   Temp: 98.4 F (36.9 C) 98.1 F (36.7 C)    TempSrc: Oral     Resp: 14  20 12   Height: 5\' 3"  (1.6 m)     Weight: 53.524 kg (118 lb)     SpO2: 100%  99%     Wt Readings from Last 3 Encounters:  03/15/15 53.524 kg (118 lb)  11/15/14 54.159 kg (119 lb 6.4 oz)  10/17/14 53.524 kg (118 lb)    General:  Appears calm and comfortable, quite pleasant Eyes: PERRL, normal lids, irises & conjunctiva ENT: grossly normal hearing, lips & tongue Neck: no LAD, masses or thyromegaly Cardiovascular: Irregularly irregular, no m/r/g. No LE  pitting edema. Telemetry: A. fib Respiratory: CTA bilaterally, no w/r/r. Normal respiratory effort. Abdomen: soft, ntnd positive bowel sounds Skin: Warm and dry faint petechial rash bilateral lower extremities distal and Musculoskeletal: grossly normal tone BUE/BLE Psychiatric: grossly normal mood and affect, speech fluent and appropriate Neurologic: grossly non-focal. Each clear facial symmetry bilateral upper extremity strength 5 out of 5 or extremity strength bilateral 5 out of 5 no pronator drift           Labs on Admission:  Basic Metabolic Panel:  Recent Labs Lab 03/15/15 1135  NA 139  K 4.3  CL 103  CO2 25  GLUCOSE 81  BUN 18  CREATININE 0.73  CALCIUM 9.7   Liver Function Tests:  Recent Labs Lab 03/15/15 1135  AST 48*  ALT 33  ALKPHOS 72  BILITOT 1.2  PROT  8.1  ALBUMIN 4.8   No results for input(s): LIPASE, AMYLASE in the last 168 hours. No results for input(s): AMMONIA in the last 168 hours. CBC:  Recent Labs Lab 03/15/15 1135  WBC 6.8  NEUTROABS 3.4  HGB 14.4  HCT 41.5  MCV 93.7  PLT 204   Cardiac Enzymes:  Recent Labs Lab 03/15/15 1135  TROPONINI <0.03    BNP (last 3 results) No results for input(s): BNP in the last 8760 hours.  ProBNP (last 3 results) No results for input(s): PROBNP in the last 8760 hours.  CBG: No results for input(s): GLUCAP in the last 168 hours.  Radiological Exams on Admission: Ct Head Wo Contrast  03/15/2015   CLINICAL DATA:  10 minute episode blurred vision and weakness at 10:00 a.m. today.  EXAM: CT HEAD WITHOUT CONTRAST  TECHNIQUE: Contiguous axial images were obtained from the base of the skull through the vertex without intravenous contrast.  COMPARISON:  None.  FINDINGS: The brain appears normal without evidence of hemorrhage, infarct, mass lesion, mass effect, midline shift or abnormal extra-axial fluid collection. No hydrocephalus or pneumocephalus. The calvarium is intact.  IMPRESSION: Negative head CT.    Electronically Signed   By: Inge Rise M.D.   On: 03/15/2015 12:10    EKG: Independently reviewed A. fib with LVH  Assessment/Plan Principal Problem:   TIA (transient ischemic attack): Patient with A. fib on Coumadin, strong family history CAD, chads score 2. Will admit to telemetry will obtain carotid Dopplers 2-D echo MRI lipid panel hemoglobin A1c. She is already on Lipitor. She is allergic to aspirin but is on Coumadin. Will hold her lisinopril and Toprol-XL for now to allow for permissive hypertension. Will request neurology consult  Active Problems:   Atrial fibrillation: Rate controlled. Home medications include Cardizem, lisinopril, Toprol-XL. Will hold her lisinopril and to allow for permissive hypertension. Continue Cardizem. Monitor closely plan to resume home medications as indicated    Essential hypertension: Is 143/93. Home medications include Cardizem Toprol-XL lisinopril. Will continue her Cardizem hold lisinopril and toprol for now. monitor    Requested neurology consult  Code Status: full DVT Prophylaxis: Family Communication: husband at bedside Disposition Plan: home hopefully 24 hours  Time spent: 26 minutes  [facility] Pager [phone]

## 2015-03-15 NOTE — Progress Notes (Signed)
ANTICOAGULATION CONSULT NOTE - Initial Consult  Pharmacy Consult for Coumadin (chronic Rx PTA) Indication: atrial fibrillation  Allergies  Allergen Reactions  . Aspirin Anaphylaxis, Hives, Rash and Other (See Comments)    As well as short of breath; Previously was rushed to hospital-given epinephrine injection.     Patient Measurements: Height: 5\' 3"  (160 cm) Weight: 118 lb (53.524 kg) IBW/kg (Calculated) : 52.4  Vital Signs: Temp: 98.1 F (36.7 C) (04/11 1140) Temp Source: Oral (04/11 1106) BP: 149/91 mmHg (04/11 1500) Pulse Rate: 73 (04/11 1500)  Labs:  Recent Labs  03/15/15 1135  HGB 14.4  HCT 41.5  PLT 204  APTT 38*  LABPROT 22.2*  INR 1.92*  CREATININE 0.73  TROPONINI <0.03    Estimated Creatinine Clearance: 50.3 mL/min (by C-G formula based on Cr of 0.73).   Medical History: Past Medical History  Diagnosis Date  . Hypertension   . Persistent atrial fibrillation     History of; cardioversion in 2004  . Severe tricuspid regurgitation     on echo 02/2011 ?rheumatic heart disease  . Thyroid mass 10/10/2001    benign lesion removed  . Hypothyroidism   . Hyperlipidemia   . Mild aortic insufficiency   . Mitral valve regurgitation   . History of nuclear stress test 03/2012    lexiscan; normal pattern of perfusion; normal, low risk     Medications:  Prescriptions prior to admission  Medication Sig Dispense Refill Last Dose  . acetaminophen (TYLENOL) 500 MG tablet Take 1,000 mg by mouth every 6 (six) hours as needed. For pain   Past Week at Unknown time  . atorvastatin (LIPITOR) 10 MG tablet Take 1 tablet (10 mg total) by mouth daily. 90 tablet 3 03/14/2015 at Unknown time  . B Complex Vitamins (B COMPLEX 1 PO) Take 1 tablet by mouth daily.   03/15/2015 at Unknown time  . CALCIUM-VITAMIN D PO Take 1 tablet by mouth 2 (two) times daily.   03/14/2015 at Unknown time  . diltiazem (CARDIZEM CD) 240 MG 24 hr capsule Take 1 capsule (240 mg total) by mouth daily. 30  capsule 10 03/14/2015 at Unknown time  . fish oil-omega-3 fatty acids 1000 MG capsule Take 1 g by mouth daily.   03/15/2015 at Unknown time  . levothyroxine (SYNTHROID, LEVOTHROID) 88 MCG tablet Take 88 mcg by mouth daily.   03/15/2015 at Unknown time  . lisinopril (PRINIVIL,ZESTRIL) 20 MG tablet Take 1 tablet (20 mg total) by mouth 2 (two) times daily. 60 tablet 10 03/15/2015 at Unknown time  . metoprolol succinate (TOPROL-XL) 50 MG 24 hr tablet Take 1 tablet (50 mg total) by mouth daily. Take with or immediately following a meal. 90 tablet 3 03/14/2015 at 01730  . Multiple Vitamin (MULTIVITAMIN WITH MINERALS) TABS Take 1 tablet by mouth daily.   03/15/2015 at Unknown time  . tretinoin (RETIN-A) 0.05 % cream Apply 1 application topically daily.    unknown  . warfarin (COUMADIN) 6 MG tablet TAKE 1 TABLET BY MOUTH ONCE DAILY OR AS DIRECTED BY COUMADIN CLINIC. 30 tablet 5 03/14/2015 at Unknown time    Assessment: 74yo female on chronic Coumadin PTA for h/o afib.  INR is slightly below goal on admission.  Records from Coumadin Clinic noted:  Anticoagulation Monitoring 03/10/2015  INR goal 2.0-3.0  Assoc. INR Date 03/10/2015  Associated INR 2.1  Pt. deviation No  Current weekly dose 33 mg  Sunday dose 6 mg  Monday dose 3 mg  Tuesday dose 6 mg  Wednesday dose 3 mg  Thursday dose 6 mg  Friday dose 3 mg  Saturday dose 6 mg  Weekly dose 33 mg  Dose description Continue coumadin 1 tablet daily except 1/2 tablet on Mondays, Wednesdays and Fridays  Return date 04/21/2015    Goal of Therapy:  INR 2-3 Monitor platelets by anticoagulation protocol: Yes   Plan:   Coumadin 6mg  po x 1 today per home regimen  INR daily until stable  Hart Robinsons A 03/15/2015,4:43 PM

## 2015-03-15 NOTE — ED Notes (Signed)
Pt resting comfortably, no needs expressed.  MD at bedside.

## 2015-03-15 NOTE — ED Notes (Signed)
Episode in the past hour in the grocery store,   Where she had bilateral blurred vision, weakness and confusion.

## 2015-03-15 NOTE — Progress Notes (Signed)
Patient refused ordered CBG, stated she was not diabetic and did not want her blood sugar checked. Hospitalist notified.

## 2015-03-15 NOTE — Progress Notes (Signed)
Notified Dr. Roderic Palau that the patient request that she wants to eat.  MD states as long as she passed her swallow screen she can have a heart healthy diet.  According to reporting nurse and documentation states she passed the screen new orders given and followed.

## 2015-03-15 NOTE — ED Notes (Signed)
Pt reports vision and mentation are at baseline, confirmed by pt's husband. Pt has a HX of A-fib, on coumadin, has never had an episode of sudden blindness or altered mental status.

## 2015-03-16 ENCOUNTER — Other Ambulatory Visit (HOSPITAL_COMMUNITY): Payer: BLUE CROSS/BLUE SHIELD

## 2015-03-16 ENCOUNTER — Observation Stay (HOSPITAL_COMMUNITY): Payer: Medicare Other

## 2015-03-16 DIAGNOSIS — I6523 Occlusion and stenosis of bilateral carotid arteries: Secondary | ICD-10-CM | POA: Diagnosis not present

## 2015-03-16 DIAGNOSIS — G459 Transient cerebral ischemic attack, unspecified: Secondary | ICD-10-CM | POA: Diagnosis not present

## 2015-03-16 DIAGNOSIS — I4891 Unspecified atrial fibrillation: Secondary | ICD-10-CM | POA: Diagnosis not present

## 2015-03-16 DIAGNOSIS — E785 Hyperlipidemia, unspecified: Secondary | ICD-10-CM | POA: Diagnosis not present

## 2015-03-16 DIAGNOSIS — I10 Essential (primary) hypertension: Secondary | ICD-10-CM | POA: Diagnosis not present

## 2015-03-16 DIAGNOSIS — R4182 Altered mental status, unspecified: Secondary | ICD-10-CM | POA: Diagnosis not present

## 2015-03-16 DIAGNOSIS — R569 Unspecified convulsions: Secondary | ICD-10-CM | POA: Diagnosis not present

## 2015-03-16 LAB — PROTIME-INR
INR: 1.82 — ABNORMAL HIGH (ref 0.00–1.49)
Prothrombin Time: 21.2 seconds — ABNORMAL HIGH (ref 11.6–15.2)

## 2015-03-16 LAB — HEMOGLOBIN A1C
Hgb A1c MFr Bld: 5.2 % (ref 4.8–5.6)
Mean Plasma Glucose: 103 mg/dL

## 2015-03-16 LAB — VITAMIN B12: Vitamin B-12: 959 pg/mL — ABNORMAL HIGH (ref 211–911)

## 2015-03-16 LAB — TROPONIN I: Troponin I: <0.03 ng/mL (ref <0.031)

## 2015-03-16 LAB — GLUCOSE, CAPILLARY: Glucose-Capillary: 87 mg/dL (ref 70–99)

## 2015-03-16 MED ORDER — WARFARIN SODIUM 5 MG PO TABS
6.0000 mg | ORAL_TABLET | Freq: Once | ORAL | Status: AC
  Administered 2015-03-16: 6 mg via ORAL
  Filled 2015-03-16 (×2): qty 1

## 2015-03-16 NOTE — Progress Notes (Signed)
PT Cancellation Note  Patient Details Name: Kathleen Mcconnell MRN: [id number] DOB: [birth year]   Cancelled Treatment:    Reason Eval/Treat Not Completed: PT screened, no needs identified, will sign off   Sable Feil 03/16/2015, 8:46 AM

## 2015-03-16 NOTE — Progress Notes (Signed)
Patient ID: Kathleen Mcconnell, female   DOB: 01-12-1941, 74 y.o.   MRN: [id number]  Elliott A. Merlene Laughter, MD     [email or web address]          Kathleen Mcconnell is an 74 y.o. female.   Assessment/Plan: 1. Acute neurological events suspicious for possible complex partial seizures or TIA. At this point in time is unclear which of these is the culprit.  2. Atrial fibrillation. 3. Hypertension  RECOMMENDATION: Continue with warfarin therapy. EEG. Echo pending.  She is doing well and is at baseline. She is eager to go home. Testing is still pending above however.  GENERAL: This is a thin pleasant female who appears about 10 years about than the stated age.  HEENT: Supple. Atraumatic normocephalic.   ABDOMEN: soft  EXTREMITIES: No edema   BACK: Normal.  SKIN: Normal by inspection.   MENTAL STATUS: Alert and oriented person, place year, month and date. H is still appropriately. Speech, language and cognition are generally intact. Judgment and insight normal.   CRANIAL NERVES: Pupils are equal, round and reactive to light and accommodation; extra ocular movements are full, there is no significant nystagmus; visual fields are full; upper and lower facial muscles are normal in strength and symmetric, there is no flattening of the nasolabial folds; tongue is midline; uvula is midline; shoulder elevation is normal.  MOTOR: Normal tone, bulk and strength - in the upper extremities; no pronator drift. Left lower extremity slightly weak graded as 4+/5 both proximally and distally. Bulk and tone are normal. The right leg shows normal tone, bulk and strength. There is no drift of the lower extremities.  COORDINATION: Left finger to nose is normal, right finger to nose is normal, No rest tremor; no intention tremor; no postural tremor; no bradykinesia.     Objective: Vital signs in last 24 hours: Temp:  [97.7 F (36.5 C)-98.4 F (36.9 C)] 97.7 F (36.5 C) (04/12  0409) Pulse Rate:  [69-78] 73 (04/12 0409) Resp:  [12-21] 16 (04/12 0409) BP: (101-162)/(57-93) 101/66 mmHg (04/12 0409) SpO2:  [97 %-100 %] 97 % (04/12 0409) Weight:  [53.524 kg (118 lb)] 53.524 kg (118 lb) (04/11 1656)  Intake/Output from previous day:   Intake/Output this shift: Total I/O In: 360 [P.O.:360] Out: -  Nutritional status: Diet Heart Room service appropriate?: Yes; Fluid consistency:: Thin   Lab Results: Results for orders placed or performed during the hospital encounter of 03/15/15 (from the past 48 hour(s))  Hemoglobin A1c     Status: None   Collection Time: 03/15/15 11:15 AM  Result Value Ref Range   Hgb A1c MFr Bld 5.2 4.8 - 5.6 %    Comment: (NOTE)         Pre-diabetes: 5.7 - 6.4         Diabetes: >6.4         Glycemic control for adults with diabetes: <7.0    Mean Plasma Glucose 103 mg/dL    Comment: (NOTE) Performed At: [facility] [place], [place] [id number] Lindon Romp MD [id number]   Fasting lipid panel     Status: None   Collection Time: 03/15/15 11:15 AM  Result Value Ref Range   Cholesterol 168 0 - 200 mg/dL   Triglycerides 68 <150 mg/dL   HDL 86 >39 mg/dL   Total CHOL/HDL Ratio 2.0 RATIO   VLDL 14 0 - 40 mg/dL   LDL Cholesterol 68 0 - 99 mg/dL    Comment:  Total Cholesterol/HDL:CHD Risk Coronary Heart Disease Risk Table                     Men   Women  1/2 Average Risk   3.4   3.3  Average Risk       5.0   4.4  2 X Average Risk   9.6   7.1  3 X Average Risk  23.4   11.0        Use the calculated Patient Ratio above and the CHD Risk Table to determine the patient's CHD Risk.        ATP III CLASSIFICATION (LDL):  <100     mg/dL   Optimal  100-129  mg/dL   Near or Above                    Optimal  130-159  mg/dL   Borderline  160-189  mg/dL   High  >190     mg/dL   Very High   Ethanol     Status: None   Collection Time: 03/15/15 11:35 AM  Result Value Ref Range   Alcohol, Ethyl (B) <5 0  - 9 mg/dL    Comment:        LOWEST DETECTABLE LIMIT FOR SERUM ALCOHOL IS 11 mg/dL FOR MEDICAL PURPOSES ONLY   Protime-INR     Status: Abnormal   Collection Time: 03/15/15 11:35 AM  Result Value Ref Range   Prothrombin Time 22.2 (H) 11.6 - 15.2 seconds   INR 1.92 (H) 0.00 - 1.49  APTT     Status: Abnormal   Collection Time: 03/15/15 11:35 AM  Result Value Ref Range   aPTT 38 (H) 24 - 37 seconds    Comment:        IF BASELINE aPTT IS ELEVATED, SUGGEST PATIENT RISK ASSESSMENT BE USED TO DETERMINE APPROPRIATE ANTICOAGULANT THERAPY.   CBC     Status: None   Collection Time: 03/15/15 11:35 AM  Result Value Ref Range   WBC 6.8 4.0 - 10.5 K/uL   RBC 4.43 3.87 - 5.11 MIL/uL   Hemoglobin 14.4 12.0 - 15.0 g/dL   HCT 41.5 36.0 - 46.0 %   MCV 93.7 78.0 - 100.0 fL   MCH 32.5 26.0 - 34.0 pg   MCHC 34.7 30.0 - 36.0 g/dL   RDW 13.4 11.5 - 15.5 %   Platelets 204 150 - 400 K/uL  Differential     Status: None   Collection Time: 03/15/15 11:35 AM  Result Value Ref Range   Neutrophils Relative % 49 43 - 77 %   Neutro Abs 3.4 1.7 - 7.7 K/uL   Lymphocytes Relative 39 12 - 46 %   Lymphs Abs 2.7 0.7 - 4.0 K/uL   Monocytes Relative 10 3 - 12 %   Monocytes Absolute 0.7 0.1 - 1.0 K/uL   Eosinophils Relative 1 0 - 5 %   Eosinophils Absolute 0.1 0.0 - 0.7 K/uL   Basophils Relative 1 0 - 1 %   Basophils Absolute 0.1 0.0 - 0.1 K/uL  Comprehensive metabolic panel     Status: Abnormal   Collection Time: 03/15/15 11:35 AM  Result Value Ref Range   Sodium 139 135 - 145 mmol/L   Potassium 4.3 3.5 - 5.1 mmol/L   Chloride 103 96 - 112 mmol/L   CO2 25 19 - 32 mmol/L   Glucose, Bld 81 70 - 99 mg/dL   BUN 18 6 - 23  mg/dL   Creatinine, Ser 0.73 0.50 - 1.10 mg/dL   Calcium 9.7 8.4 - 10.5 mg/dL   Total Protein 8.1 6.0 - 8.3 g/dL   Albumin 4.8 3.5 - 5.2 g/dL   AST 48 (H) 0 - 37 U/L   ALT 33 0 - 35 U/L   Alkaline Phosphatase 72 39 - 117 U/L   Total Bilirubin 1.2 0.3 - 1.2 mg/dL   GFR calc non Af Amer  82 (L) >90 mL/min   GFR calc Af Amer >90 >90 mL/min    Comment: (NOTE) The eGFR has been calculated using the CKD EPI equation. This calculation has not been validated in all clinical situations. eGFR's persistently <90 mL/min signify possible Chronic Kidney Disease.    Anion gap 11 5 - 15  Troponin I     Status: None   Collection Time: 03/15/15 11:35 AM  Result Value Ref Range   Troponin I <0.03 <0.031 ng/mL    Comment:        NO INDICATION OF MYOCARDIAL INJURY.   Urine Drug Screen     Status: None   Collection Time: 03/15/15  2:02 PM  Result Value Ref Range   Opiates NONE DETECTED NONE DETECTED   Cocaine NONE DETECTED NONE DETECTED   Benzodiazepines NONE DETECTED NONE DETECTED   Amphetamines NONE DETECTED NONE DETECTED   Tetrahydrocannabinol NONE DETECTED NONE DETECTED   Barbiturates NONE DETECTED NONE DETECTED    Comment:        DRUG SCREEN FOR MEDICAL PURPOSES ONLY.  IF CONFIRMATION IS NEEDED FOR ANY PURPOSE, NOTIFY LAB WITHIN 5 DAYS.        LOWEST DETECTABLE LIMITS FOR URINE DRUG SCREEN Drug Class       Cutoff (ng/mL) Amphetamine      1000 Barbiturate      200 Benzodiazepine   893 Tricyclics       734 Opiates          300 Cocaine          300 THC              50   Urinalysis, Routine w reflex microscopic     Status: Abnormal   Collection Time: 03/15/15  2:02 PM  Result Value Ref Range   Color, Urine YELLOW YELLOW   APPearance CLEAR CLEAR   Specific Gravity, Urine 1.010 1.005 - 1.030   pH 5.5 5.0 - 8.0   Glucose, UA NEGATIVE NEGATIVE mg/dL   Hgb urine dipstick TRACE (A) NEGATIVE   Bilirubin Urine NEGATIVE NEGATIVE   Ketones, ur TRACE (A) NEGATIVE mg/dL   Protein, ur NEGATIVE NEGATIVE mg/dL   Urobilinogen, UA 0.2 0.0 - 1.0 mg/dL   Nitrite NEGATIVE NEGATIVE   Leukocytes, UA NEGATIVE NEGATIVE  Urine microscopic-add on     Status: None   Collection Time: 03/15/15  2:02 PM  Result Value Ref Range   Squamous Epithelial / LPF RARE RARE   WBC, UA 0-2 <3  WBC/hpf   RBC / HPF 3-6 <3 RBC/hpf   Bacteria, UA RARE RARE  TSH     Status: None   Collection Time: 03/15/15  5:31 PM  Result Value Ref Range   TSH 0.878 0.350 - 4.500 uIU/mL  Vitamin B12     Status: Abnormal   Collection Time: 03/15/15  5:31 PM  Result Value Ref Range   Vitamin B-12 959 (H) 211 - 911 pg/mL    Comment: Performed at [facility]  Troponin I  Status: None   Collection Time: 03/15/15  5:31 PM  Result Value Ref Range   Troponin I <0.03 <0.031 ng/mL    Comment:        NO INDICATION OF MYOCARDIAL INJURY.   Troponin I     Status: None   Collection Time: 03/16/15  6:32 AM  Result Value Ref Range   Troponin I <0.03 <0.031 ng/mL    Comment:        NO INDICATION OF MYOCARDIAL INJURY.   Protime-INR     Status: Abnormal   Collection Time: 03/16/15  6:32 AM  Result Value Ref Range   Prothrombin Time 21.2 (H) 11.6 - 15.2 seconds   INR 1.82 (H) 0.00 - 1.49  Glucose, capillary     Status: None   Collection Time: 03/16/15  7:45 AM  Result Value Ref Range   Glucose-Capillary 87 70 - 99 mg/dL    Lipid Panel  Recent Labs  03/15/15 1115  CHOL 168  TRIG 68  HDL 86  CHOLHDL 2.0  VLDL 14  LDLCALC 68    Studies/Results: Carotid duplex Doppler unrevealing.  Medications:  Scheduled Meds: . atorvastatin  10 mg Oral q1800  . diltiazem  240 mg Oral Daily  . levothyroxine  88 mcg Oral QAC breakfast  . warfarin  6 mg Oral Once  . warfarin  6 mg Oral Once  . Warfarin - Pharmacist Dosing Inpatient   Does not apply Q24H   Continuous Infusions:  PRN Meds:.acetaminophen, traZODone       Yarely Bebee A. Merlene Laughter, M.D.  Diplomate, Tax adviser of Psychiatry and Neurology ( Neurology).

## 2015-03-16 NOTE — Progress Notes (Addendum)
ANTICOAGULATION CONSULT NOTE - follow up  Pharmacy Consult for Coumadin (chronic Rx PTA) Indication: atrial fibrillation  Allergies  Allergen Reactions  . Aspirin Anaphylaxis, Hives, Rash and Other (See Comments)    As well as short of breath; Previously was rushed to hospital-given epinephrine injection.    Patient Measurements: Height: 5\' 3"  (160 cm) Weight: 118 lb (53.524 kg) IBW/kg (Calculated) : 52.4  Vital Signs: Temp: 97.7 F (36.5 C) (04/12 0409) Temp Source: Oral (04/12 0409) BP: 101/66 mmHg (04/12 0409) Pulse Rate: 73 (04/12 0409)  Labs:  Recent Labs  03/15/15 1135 03/15/15 1731 03/16/15 0632  HGB 14.4  --   --   HCT 41.5  --   --   PLT 204  --   --   APTT 38*  --   --   LABPROT 22.2*  --  21.2*  INR 1.92*  --  1.82*  CREATININE 0.73  --   --   TROPONINI <0.03 <0.03 <0.03    Estimated Creatinine Clearance: 50.3 mL/min (by C-G formula based on Cr of 0.73).  Medical History: Past Medical History  Diagnosis Date  . Hypertension   . Persistent atrial fibrillation     History of; cardioversion in 2004  . Severe tricuspid regurgitation     on echo 02/2011 ?rheumatic heart disease  . Thyroid mass 10/10/2001    benign lesion removed  . Hypothyroidism   . Hyperlipidemia   . Mild aortic insufficiency   . Mitral valve regurgitation   . History of nuclear stress test 03/2012    lexiscan; normal pattern of perfusion; normal, low risk     Medications:  Prescriptions prior to admission  Medication Sig Dispense Refill Last Dose  . acetaminophen (TYLENOL) 500 MG tablet Take 1,000 mg by mouth every 6 (six) hours as needed. For pain   Past Week at Unknown time  . atorvastatin (LIPITOR) 10 MG tablet Take 1 tablet (10 mg total) by mouth daily. 90 tablet 3 03/14/2015 at Unknown time  . B Complex Vitamins (B COMPLEX 1 PO) Take 1 tablet by mouth daily.   03/15/2015 at Unknown time  . CALCIUM-VITAMIN D PO Take 1 tablet by mouth 2 (two) times daily.   03/14/2015 at Unknown  time  . diltiazem (CARDIZEM CD) 240 MG 24 hr capsule Take 1 capsule (240 mg total) by mouth daily. 30 capsule 10 03/14/2015 at Unknown time  . fish oil-omega-3 fatty acids 1000 MG capsule Take 1 g by mouth daily.   03/15/2015 at Unknown time  . levothyroxine (SYNTHROID, LEVOTHROID) 88 MCG tablet Take 88 mcg by mouth daily.   03/15/2015 at Unknown time  . lisinopril (PRINIVIL,ZESTRIL) 20 MG tablet Take 1 tablet (20 mg total) by mouth 2 (two) times daily. 60 tablet 10 03/15/2015 at Unknown time  . metoprolol succinate (TOPROL-XL) 50 MG 24 hr tablet Take 1 tablet (50 mg total) by mouth daily. Take with or immediately following a meal. 90 tablet 3 03/14/2015 at 01730  . Multiple Vitamin (MULTIVITAMIN WITH MINERALS) TABS Take 1 tablet by mouth daily.   03/15/2015 at Unknown time  . tretinoin (RETIN-A) 0.05 % cream Apply 1 application topically daily.    unknown  . warfarin (COUMADIN) 6 MG tablet TAKE 1 TABLET BY MOUTH ONCE DAILY OR AS DIRECTED BY COUMADIN CLINIC. 30 tablet 5 03/14/2015 at Unknown time   Assessment: 74yo female on chronic Coumadin PTA for h/o afib.  INR is slightly below goal.  Dose was not charted as given yesterday. Records  from [facility] Clinic noted:  Anticoagulation Monitoring 03/10/2015  INR goal 2.0-3.0  Assoc. INR Date 03/10/2015  Associated INR 2.1  Pt. deviation No  Current weekly dose 33 mg  Sunday dose 6 mg  Monday dose 3 mg  Tuesday dose 6 mg  Wednesday dose 3 mg  Thursday dose 6 mg  Friday dose 3 mg  Saturday dose 6 mg  Weekly dose 33 mg  Dose description Continue coumadin 1 tablet daily except 1/2 tablet on Mondays, Wednesdays and Fridays  Return date 04/21/2015   Goal of Therapy:  INR 2-3 Monitor platelets by anticoagulation protocol: Yes   Plan:   Coumadin 6mg  po x 1 today per home regimen  INR daily until stable  [name] A 03/16/2015,9:25 AM

## 2015-03-16 NOTE — Progress Notes (Signed)
Physician Discharge Summary  Kathleen Mcconnell:[id number] DOB: 1941-09-27 DOA: 03/15/2015  PCP: Rocky Morel, MD  Admit date: 03/15/2015 Discharge date: 03/16/2015  Time spent: 40 minutes  Recommendations for Outpatient Follow-up:  1. Follow up with PCP 1-2 weeks for evaluation of blurred  Vision/dizzy 2. EEG outpatient as scheduled 3. Call Dr Freddie Apley office for results of EEG  Discharge Diagnoses:  Principal Problem:   TIA (transient ischemic attack) Active Problems:   Atrial fibrillation   Essential hypertension   Other specified hypothyroidism   Discharge Condition: stable  Diet recommendation: heart healthy  Filed Weights   03/15/15 1106 03/15/15 1656  Weight: 53.524 kg (118 lb) 53.524 kg (118 lb)    History of present illness:  Kathleen Mcconnell is a very pleasant 74 y.o. female with a past medical history that includes A. fib and mitral valve regurg on Coumadin, hypertension, hypothyroidism, hyperlipidemia presented to the emergency department on 03/15/15 with the chief complaint of blurred vision and intermittent confusion. Symptoms resolved by the time she got to the emergency department. Admitted for TIA workup  She reported she was in the grocery store that morning and suddenly developed blurred vision. She reported the vision in both eyes was blurry. She also stated that "I wasn't quite myself". She denied lightheadedness and dizziness syncope or near-syncope but "I did not feel right". She denied headache chest pain palpitations nausea vomiting diaphoresis shortness of breath abdominal pain. She reported she called her daughter who called the patient's husband and he went to the grocery store. He reported there was no slurred speech weakness of extremities unsteady gait. She got into car thinking she would drive home and she stated "I had another little spell" and therefore  her husband brought her to the emergency department.  Work up included comprehensive  metabolic panel was unremarkable complete blood count was unremarkable INR 1.9 CT of the head negative for any acute abnormalities. He was afebrile hemodynamically stable and not hypoxic. The emergency department  reported she passed a bedside swallow eval.  Hospital Course:  TIA (transient ischemic attack): Patient with A. fib on Coumadin, strong family history CAD, chads score 2. No events on tele. Carotid Dopplers, 2-D echo with EF 60% and no wall motion abnormality MRI unremarkable,  lipid panel unremarkable, hemoglobin A1c 5.2. She is allergic to aspirin but is on Coumadin. Continue statin. Evaluated by neurology who opine acute neurological events suspicious for possible complex partial seizure or TIA. Recommending EEG and this will be done OP and followed.   Active Problems:  Atrial fibrillation: Rate controlled. Continue coumadin. INR 1.8 on discharge.   Essential hypertension: controlled. Resume home meds at discharge  Procedures:  echoSystolic function was normal. The estimated ejection fraction was in the range of 60% to 65%. Wall motion was normal; there were no regional wall motion abnormalities. Findings consistent with left ventricular diastolic dysfunction, ungraded in setting of apparent atrial fibrillation.  Consultations:  Dr Merlene Laughter neurology  Discharge Exam: [name] Vitals:   03/16/15 1011  BP: 118/75  Pulse: 80  Temp: 98.4 F (36.9 C)  Resp: 17    General: well nourished appears well Cardiovascular: irregularly irregular no MGR  Respiratory: normal effort BS clear  Discharge Instructions    Current Discharge Medication List    CONTINUE these medications which have NOT CHANGED   Details  acetaminophen (TYLENOL) 500 MG tablet Take 1,000 mg by mouth every 6 (six) hours as needed. For pain    atorvastatin (LIPITOR) 10  MG tablet Take 1 tablet (10 mg total) by mouth daily. Qty: 90 tablet, Refills: 3    B Complex Vitamins (B COMPLEX 1 PO)  Take 1 tablet by mouth daily.    CALCIUM-VITAMIN D PO Take 1 tablet by mouth 2 (two) times daily.    diltiazem (CARDIZEM CD) 240 MG 24 hr capsule Take 1 capsule (240 mg total) by mouth daily. Qty: 30 capsule, Refills: 10    fish oil-omega-3 fatty acids 1000 MG capsule Take 1 g by mouth daily.    levothyroxine (SYNTHROID, LEVOTHROID) 88 MCG tablet Take 88 mcg by mouth daily.    lisinopril (PRINIVIL,ZESTRIL) 20 MG tablet Take 1 tablet (20 mg total) by mouth 2 (two) times daily. Qty: 60 tablet, Refills: 10    metoprolol succinate (TOPROL-XL) 50 MG 24 hr tablet Take 1 tablet (50 mg total) by mouth daily. Take with or immediately following a meal. Qty: 90 tablet, Refills: 3    Multiple Vitamin (MULTIVITAMIN WITH MINERALS) TABS Take 1 tablet by mouth daily.    tretinoin (RETIN-A) 0.05 % cream Apply 1 application topically daily.     warfarin (COUMADIN) 6 MG tablet TAKE 1 TABLET BY MOUTH ONCE DAILY OR AS DIRECTED BY COUMADIN CLINIC. Qty: 30 tablet, Refills: 5       Allergies  Allergen Reactions  . Aspirin Anaphylaxis, Hives, Rash and Other (See Comments)    As well as short of breath; Previously was rushed to hospital-given epinephrine injection.    Follow-up Information    Follow up with Rocky Morel, MD. Schedule an appointment as soon as possible for a visit in 2 weeks.   Specialty:  Family Medicine   Why:  evaluation of symptoms   Contact information:   [address] [place] [address] [phone]        The results of significant diagnostics from this hospitalization (including imaging, microbiology, ancillary and laboratory) are listed below for reference.    Significant Diagnostic Studies: Ct Head Wo Contrast  03/15/2015   CLINICAL DATA:  10 minute episode blurred vision and weakness at 10:00 a.m. today.  EXAM: CT HEAD WITHOUT CONTRAST  TECHNIQUE: Contiguous axial images were obtained from the base of the skull through the vertex without  intravenous contrast.  COMPARISON:  None.  FINDINGS: The brain appears normal without evidence of hemorrhage, infarct, mass lesion, mass effect, midline shift or abnormal extra-axial fluid collection. No hydrocephalus or pneumocephalus. The calvarium is intact.  IMPRESSION: Negative head CT.   Electronically Signed   By: Inge Rise M.D.   On: 03/15/2015 12:10   Mr Angiogram Head Wo Contrast  03/15/2015   CLINICAL DATA:  Blurred vision.  Altered mental status  EXAM: MRA HEAD WITHOUT CONTRAST  TECHNIQUE: Angiographic images of the Circle of Willis were obtained using MRA technique without intravenous contrast.  COMPARISON:  MRI head 03/15/2015  FINDINGS: Cavernous carotid widely patent bilaterally without stenosis. Anterior and middle cerebral arteries widely patent without stenosis.  Both vertebral arteries patent to the basilar. Right vertebral dominant. Basilar widely patent. Left PICA patent. AICA, superior cerebellar, posterior cerebral arteries patent bilaterally without significant stenosis.  Negative for cerebral aneurysm.  IMPRESSION: Negative   Electronically Signed   By: Franchot Gallo M.D.   On: 03/15/2015 17:12   Mr Angiogram Neck W Wo Contrast  03/15/2015   ADDENDUM REPORT: 03/15/2015 17:53  EXAM: MRA NECK WITHOUT AND WITH CONTRAST  COMPARISON:  CT HEAD 03/15/2015  CONTRAST:  10 ML MULTIHANCE IV  FINDINGS: Normal  aortic arch.  Three vessel aortic arch.  Both vertebral arteries widely patent the basilar. Right vertebral dominant.  Both carotid arteries widely patent without stenosis or dissection.  IMPRESSION: Negative MRA neck   Electronically Signed   By: Franchot Gallo M.D.   On: 03/15/2015 17:53   03/15/2015   ADDENDUM REPORT: 03/15/2015 17:47  ADDENDUM: Correction:  Above report exam type is incorrect and should say:  MR HEAD WITHOUT CONTRAST   Electronically Signed   By: Franchot Gallo M.D.   On: 03/15/2015 17:47   03/15/2015   CLINICAL DATA:  Blurred vision. Altered mental status.  Weakness and dizziness.  EXAM: MRA HEAD WITHOUT   CONTRAST  TECHNIQUE: Multiplanar and multiecho pulse sequences of the head were obtained without intravenous contrast.  COMPARISON:  CT head 03/15/2015  FINDINGS: Negative for acute infarct  Mild cerebral atrophy. Mild ventricular enlargement consistent with atrophy. Pituitary normal in size. Craniocervical junction normal.  Calvarium intact.  Paranasal sinuses clear.  Mild chronic microvascular ischemic change in the white matter. Brainstem and cerebellum intact  Negative for intracranial hemorrhage  Negative for mass or edema.  IMPRESSION: Mild chronic microvascular ischemic change.  Generalized atrophy  No acute abnormality detected.  Electronically Signed: By: Franchot Gallo M.D. On: 03/15/2015 17:39   Mr Brain Wo Contrast  03/15/2015   ADDENDUM REPORT: 03/15/2015 17:53  EXAM: MRA NECK WITHOUT AND WITH CONTRAST  COMPARISON:  CT HEAD 03/15/2015  CONTRAST:  10 ML MULTIHANCE IV  FINDINGS: Normal aortic arch.  Three vessel aortic arch.  Both vertebral arteries widely patent the basilar. Right vertebral dominant.  Both carotid arteries widely patent without stenosis or dissection.  IMPRESSION: Negative MRA neck   Electronically Signed   By: Franchot Gallo M.D.   On: 03/15/2015 17:53   03/15/2015   ADDENDUM REPORT: 03/15/2015 17:47  ADDENDUM: Correction:  Above report exam type is incorrect and should say:  MR HEAD WITHOUT CONTRAST   Electronically Signed   By: Franchot Gallo M.D.   On: 03/15/2015 17:47   03/15/2015   CLINICAL DATA:  Blurred vision. Altered mental status. Weakness and dizziness.  EXAM: MRA HEAD WITHOUT   CONTRAST  TECHNIQUE: Multiplanar and multiecho pulse sequences of the head were obtained without intravenous contrast.  COMPARISON:  CT head 03/15/2015  FINDINGS: Negative for acute infarct  Mild cerebral atrophy. Mild ventricular enlargement consistent with atrophy. Pituitary normal in size. Craniocervical junction normal.  Calvarium intact.   Paranasal sinuses clear.  Mild chronic microvascular ischemic change in the white matter. Brainstem and cerebellum intact  Negative for intracranial hemorrhage  Negative for mass or edema.  IMPRESSION: Mild chronic microvascular ischemic change.  Generalized atrophy  No acute abnormality detected.  Electronically Signed: By: Franchot Gallo M.D. On: 03/15/2015 17:39    Microbiology: No results found for this or any previous visit (from the past 240 hour(s)).   Labs: Basic Metabolic Panel:  Recent Labs Lab 03/15/15 1135  NA 139  K 4.3  CL 103  CO2 25  GLUCOSE 81  BUN 18  CREATININE 0.73  CALCIUM 9.7   Liver Function Tests:  Recent Labs Lab 03/15/15 1135  AST 48*  ALT 33  ALKPHOS 72  BILITOT 1.2  PROT 8.1  ALBUMIN 4.8   No results for input(s): LIPASE, AMYLASE in the last 168 hours. No results for input(s): AMMONIA in the last 168 hours. CBC:  Recent Labs Lab 03/15/15 1135  WBC 6.8  NEUTROABS 3.4  HGB 14.4  HCT  41.5  MCV 93.7  PLT 204   Cardiac Enzymes:  Recent Labs Lab 03/15/15 1135 03/15/15 1731 03/16/15 0632  TROPONINI <0.03 <0.03 <0.03   BNP: BNP (last 3 results) No results for input(s): BNP in the last 8760 hours.  ProBNP (last 3 results) No results for input(s): PROBNP in the last 8760 hours.  CBG:  Recent Labs Lab 03/16/15 0745  GLUCAP 87       Signed:  Elmer Merwin M  [facility] 03/16/2015, 12:02 PM

## 2015-03-16 NOTE — Discharge Summary (Signed)
Physician Discharge Summary  Kathleen Mcconnell:[id number] DOB: 02/06/1941 DOA: 03/15/2015  PCP: Rocky Morel, MD  Admit date: 03/15/2015 Discharge date: 03/16/2015  Time spent: 40 minutes  Recommendations for Outpatient Follow-up:  1. Follow up with PCP 1-2 weeks for evaluation of blurred  Vision/dizzy 2. EEG outpatient as scheduled 3. Call Dr Freddie Apley office for results of EEG  Discharge Diagnoses:  Principal Problem:   TIA (transient ischemic attack) Active Problems:   Atrial fibrillation   Essential hypertension   Other specified hypothyroidism   Discharge Condition: stable  Diet recommendation: heart healthy  Filed Weights   03/15/15 1106 03/15/15 1656  Weight: 53.524 kg (118 lb) 53.524 kg (118 lb)    History of present illness:  Kathleen Mcconnell is a very pleasant 74 y.o. female with a past medical history that includes A. fib and mitral valve regurg on Coumadin, hypertension, hypothyroidism, hyperlipidemia presented to the emergency department on 03/15/15 with the chief complaint of blurred vision and intermittent confusion. Symptoms resolved by the time she got to the emergency department. Admitted for TIA workup  She reported she was in the grocery store that morning and suddenly developed blurred vision. She reported the vision in both eyes was blurry. She also stated that "I wasn't quite myself". She denied lightheadedness and dizziness syncope or near-syncope but "I did not feel right". She denied headache chest pain palpitations nausea vomiting diaphoresis shortness of breath abdominal pain. She reported she called her daughter who called the patient's husband and he went to the grocery store. He reported there was no slurred speech weakness of extremities unsteady gait. She got into car thinking she would drive home and she stated "I had another little spell" and therefore  her husband brought her to the emergency department.  Work up included comprehensive  metabolic panel was unremarkable complete blood count was unremarkable INR 1.9 CT of the head negative for any acute abnormalities. He was afebrile hemodynamically stable and not hypoxic. The emergency department  reported she passed a bedside swallow eval.  Hospital Course:  TIA (transient ischemic attack): Patient with A. fib on Coumadin, strong family history CAD, chads score 2. No events on tele. Carotid Dopplers, 2-D echo with EF 60% and no wall motion abnormality MRI unremarkable,  lipid panel unremarkable, hemoglobin A1c 5.2. She is allergic to aspirin but is on Coumadin. Continue statin. Evaluated by neurology who opine acute neurological events suspicious for possible complex partial seizure or TIA. Recommending EEG and this will be done OP and followed.   Active Problems:  Atrial fibrillation: Rate controlled. Continue coumadin. INR 1.8 on discharge.   Essential hypertension: controlled. Resume home meds at discharge  Procedures:  echoSystolic function was normal. The estimated ejection fraction was in the range of 60% to 65%. Wall motion was normal; there were no regional wall motion abnormalities. Findings consistent with left ventricular diastolic dysfunction, ungraded in setting of apparent atrial fibrillation.  Consultations:  Dr Merlene Laughter neurology  Discharge Exam: Kathleen Mcconnell Vitals:   03/16/15 1409  BP: 107/64  Pulse: 79  Temp: 97.6 F (36.4 C)  Resp: 18    General: well nourished appears well Cardiovascular: irregularly irregular no MGR  Respiratory: normal effort BS clear  Discharge Instructions   Discharge Instructions    Diet - low sodium heart healthy    Complete by:  As directed      Increase activity slowly    Complete by:  As directed  Discharge Medication List as of 03/16/2015  3:22 PM    CONTINUE these medications which have NOT CHANGED   Details  acetaminophen (TYLENOL) 500 MG tablet Take 1,000 mg by mouth every 6 (six)  hours as needed. For pain, Until Discontinued, Historical Med    atorvastatin (LIPITOR) 10 MG tablet Take 1 tablet (10 mg total) by mouth daily., Starting 09/07/2014, Until Discontinued, Normal    B Complex Vitamins (B COMPLEX 1 PO) Take 1 tablet by mouth daily., Until Discontinued, Historical Med    CALCIUM-VITAMIN D PO Take 1 tablet by mouth 2 (two) times daily., Until Discontinued, Historical Med    diltiazem (CARDIZEM CD) 240 MG 24 hr capsule Take 1 capsule (240 mg total) by mouth daily., Starting 10/06/2014, Until Discontinued, Normal    fish oil-omega-3 fatty acids 1000 MG capsule Take 1 g by mouth daily., Until Discontinued, Historical Med    levothyroxine (SYNTHROID, LEVOTHROID) 88 MCG tablet Take 88 mcg by mouth daily., Until Discontinued, Historical Med    lisinopril (PRINIVIL,ZESTRIL) 20 MG tablet Take 1 tablet (20 mg total) by mouth 2 (two) times daily., Starting 10/06/2014, Until Discontinued, Normal    metoprolol succinate (TOPROL-XL) 50 MG 24 hr tablet Take 1 tablet (50 mg total) by mouth daily. Take with or immediately following a meal., Starting 10/17/2014, Until Discontinued, Normal    Multiple Vitamin (MULTIVITAMIN WITH MINERALS) TABS Take 1 tablet by mouth daily., Until Discontinued, Historical Med    tretinoin (RETIN-A) 0.05 % cream Apply 1 application topically daily. , Starting 09/30/2014, Until Discontinued, Historical Med    warfarin (COUMADIN) 6 MG tablet TAKE 1 TABLET BY MOUTH ONCE DAILY OR AS DIRECTED BY COUMADIN CLINIC., Normal       Allergies  Allergen Reactions  . Aspirin Anaphylaxis, Hives, Rash and Other (See Comments)    As well as short of breath; Previously was rushed to hospital-given epinephrine injection.    Follow-up Information    Follow up with Rocky Morel, MD In 2 weeks.   Specialty:  Family Medicine   Why:  evaluation of symptoms. Office will call with appointment   Contact information:   [address] [place]  [address] [phone]       Follow up with [facility] On 03/18/2015.   Why:  Appointment Time: 2:00 PM.  Please arrive to main entrance at [facility] at 1:45 PM to be checked in for EEG.    Contact information:   [address] [phone]      Follow up with Phillips Odor, MD.   Specialty:  Neurology   Why:  call for results of EEG   Contact information:   [address] [place] [facility] [address] [phone]        The results of significant diagnostics from this hospitalization (including imaging, microbiology, ancillary and laboratory) are listed below for reference.    Significant Diagnostic Studies: Ct Head Wo Contrast  03/15/2015   CLINICAL DATA:  10 minute episode blurred vision and weakness at 10:00 a.m. today.  EXAM: CT HEAD WITHOUT CONTRAST  TECHNIQUE: Contiguous axial images were obtained from the base of the skull through the vertex without intravenous contrast.  COMPARISON:  None.  FINDINGS: The brain appears normal without evidence of hemorrhage, infarct, mass lesion, mass effect, midline shift or abnormal extra-axial fluid collection. No hydrocephalus or pneumocephalus. The calvarium is intact.  IMPRESSION: Negative head CT.   Electronically Signed   By: Inge Rise M.D.  On: 03/15/2015 12:10   Mr Angiogram Head Wo Contrast  03/15/2015   CLINICAL DATA:  Blurred vision.  Altered mental status  EXAM: MRA HEAD WITHOUT CONTRAST  TECHNIQUE: Angiographic images of the Circle of Willis were obtained using MRA technique without intravenous contrast.  COMPARISON:  MRI head 03/15/2015  FINDINGS: Cavernous carotid widely patent bilaterally without stenosis. Anterior and middle cerebral arteries widely patent without stenosis.  Both vertebral arteries patent to the basilar. Right vertebral dominant. Basilar widely patent. Left PICA patent. AICA, superior cerebellar, posterior cerebral arteries patent bilaterally without significant  stenosis.  Negative for cerebral aneurysm.  IMPRESSION: Negative   Electronically Signed   By: Franchot Gallo M.D.   On: 03/15/2015 17:12   Mr Angiogram Neck W Wo Contrast  03/15/2015   ADDENDUM REPORT: 03/15/2015 17:53  EXAM: MRA NECK WITHOUT AND WITH CONTRAST  COMPARISON:  CT HEAD 03/15/2015  CONTRAST:  10 ML MULTIHANCE IV  FINDINGS: Normal aortic arch.  Three vessel aortic arch.  Both vertebral arteries widely patent the basilar. Right vertebral dominant.  Both carotid arteries widely patent without stenosis or dissection.  IMPRESSION: Negative MRA neck   Electronically Signed   By: Franchot Gallo M.D.   On: 03/15/2015 17:53   03/15/2015   ADDENDUM REPORT: 03/15/2015 17:47  ADDENDUM: Correction:  Above report exam type is incorrect and should say:  MR HEAD WITHOUT CONTRAST   Electronically Signed   By: Franchot Gallo M.D.   On: 03/15/2015 17:47   03/15/2015   CLINICAL DATA:  Blurred vision. Altered mental status. Weakness and dizziness.  EXAM: MRA HEAD WITHOUT   CONTRAST  TECHNIQUE: Multiplanar and multiecho pulse sequences of the head were obtained without intravenous contrast.  COMPARISON:  CT head 03/15/2015  FINDINGS: Negative for acute infarct  Mild cerebral atrophy. Mild ventricular enlargement consistent with atrophy. Pituitary normal in size. Craniocervical junction normal.  Calvarium intact.  Paranasal sinuses clear.  Mild chronic microvascular ischemic change in the white matter. Brainstem and cerebellum intact  Negative for intracranial hemorrhage  Negative for mass or edema.  IMPRESSION: Mild chronic microvascular ischemic change.  Generalized atrophy  No acute abnormality detected.  Electronically Signed: By: Franchot Gallo M.D. On: 03/15/2015 17:39   Mr Brain Wo Contrast  03/15/2015   ADDENDUM REPORT: 03/15/2015 17:53  EXAM: MRA NECK WITHOUT AND WITH CONTRAST  COMPARISON:  CT HEAD 03/15/2015  CONTRAST:  10 ML MULTIHANCE IV  FINDINGS: Normal aortic arch.  Three vessel aortic arch.  Both  vertebral arteries widely patent the basilar. Right vertebral dominant.  Both carotid arteries widely patent without stenosis or dissection.  IMPRESSION: Negative MRA neck   Electronically Signed   By: Franchot Gallo M.D.   On: 03/15/2015 17:53   03/15/2015   ADDENDUM REPORT: 03/15/2015 17:47  ADDENDUM: Correction:  Above report exam type is incorrect and should say:  MR HEAD WITHOUT CONTRAST   Electronically Signed   By: Franchot Gallo M.D.   On: 03/15/2015 17:47   03/15/2015   CLINICAL DATA:  Blurred vision. Altered mental status. Weakness and dizziness.  EXAM: MRA HEAD WITHOUT   CONTRAST  TECHNIQUE: Multiplanar and multiecho pulse sequences of the head were obtained without intravenous contrast.  COMPARISON:  CT head 03/15/2015  FINDINGS: Negative for acute infarct  Mild cerebral atrophy. Mild ventricular enlargement consistent with atrophy. Pituitary normal in size. Craniocervical junction normal.  Calvarium intact.  Paranasal sinuses clear.  Mild chronic microvascular ischemic change in the white matter. Brainstem and cerebellum  intact  Negative for intracranial hemorrhage  Negative for mass or edema.  IMPRESSION: Mild chronic microvascular ischemic change.  Generalized atrophy  No acute abnormality detected.  Electronically Signed: By: Franchot Gallo M.D. On: 03/15/2015 17:39   US Carotid Bilateral  03/16/2015   CLINICAL DATA:  TIA with blurred vision. History of hypertension, hyperlipidemia and diabetes. History of atrial fibrillation.  EXAM: BILATERAL CAROTID DUPLEX ULTRASOUND  TECHNIQUE: Pearline Cables scale imaging, color Doppler and duplex ultrasound were performed of bilateral carotid and vertebral arteries in the neck.  COMPARISON:  None.  FINDINGS: Criteria: Quantification of carotid stenosis is based on velocity parameters that correlate the residual internal carotid diameter with NASCET-based stenosis levels, using the diameter of the distal internal carotid lumen as the denominator for stenosis  measurement.  The following velocity measurements were obtained:  RIGHT  ICA:  73/18 cm/sec  CCA:  47/09 cm/sec  SYSTOLIC ICA/CCA RATIO:  0.8  DIASTOLIC ICA/CCA RATIO:  1.0  ECA:  92 cm/sec  LEFT  ICA:  81/22 cm/sec  CCA:  628/36 cm/sec  SYSTOLIC ICA/CCA RATIO:  0.8  DIASTOLIC ICA/CCA RATIO:  1.2  ECA:  83 cm/sec  RIGHT CAROTID ARTERY: The common carotid artery shows mild intimal thickening. Mild mixed plaque noted at the level of the carotid bulb and proximal ICA. Velocities and waveforms are normal and estimated right ICA stenosis is less than 50%.  RIGHT VERTEBRAL ARTERY: Antegrade flow with normal waveform and velocity.  LEFT CAROTID ARTERY: The common carotid artery demonstrates mild intimal thickening. There is suggestion of minimal heterogeneous plaque at the level of the carotid bulb extending up to the ICA origin. Velocities and waveforms are normal and estimated left ICA stenosis is less than 50%.  LEFT VERTEBRAL ARTERY: Antegrade flow with normal waveform and velocity.  IMPRESSION: Mild plaque at the level of both carotid bulbs and proximal internal carotid arteries. No significant carotid stenosis is identified with estimated bilateral ICA stenoses of less than 50%.   Electronically Signed   By: Aletta Edouard M.D.   On: 03/16/2015 17:35    Microbiology: No results found for this or any previous visit (from the past 240 hour(s)).   Labs: Basic Metabolic Panel:  Recent Labs Lab 03/15/15 1135  NA 139  K 4.3  CL 103  CO2 25  GLUCOSE 81  BUN 18  CREATININE 0.73  CALCIUM 9.7   Liver Function Tests:  Recent Labs Lab 03/15/15 1135  AST 48*  ALT 33  ALKPHOS 72  BILITOT 1.2  PROT 8.1  ALBUMIN 4.8   No results for input(s): LIPASE, AMYLASE in the last 168 hours. No results for input(s): AMMONIA in the last 168 hours. CBC:  Recent Labs Lab 03/15/15 1135  WBC 6.8  NEUTROABS 3.4  HGB 14.4  HCT 41.5  MCV 93.7  PLT 204   Cardiac Enzymes:  Recent Labs Lab 03/15/15 1135  03/15/15 1731 03/16/15 0632  TROPONINI <0.03 <0.03 <0.03   BNP: BNP (last 3 results) No results for input(s): BNP in the last 8760 hours.  ProBNP (last 3 results) No results for input(s): PROBNP in the last 8760 hours.  CBG:  Recent Labs Lab 03/16/15 0745  GLUCAP 69       Signed:  Dyanne Carrel, NP  [facility] 03/16/2015, 7:21 PM   Attending note:  Patient seen and examined. Above note reviewed.  Patient has not had any further episodes. Appreciate neurology input. MRI imaging has been unremarkable. Neurology has ordered EEG which can be  done as an outpatient. It is unclear whether she had a true TIA versus complex partial seizure. In any case, patient is stable at this time. She'll follow up with neurology as an outpatient.  Servando Kyllonen

## 2015-03-16 NOTE — Progress Notes (Signed)
Discharge instructions received and appointments reviewed. Verbalized understanding. Discharged to lobby via wheelchair in stable condition at this time.

## 2015-03-16 NOTE — Progress Notes (Signed)
  Echocardiogram 2D Echocardiogram has been performed.  Kathleen Mcconnell 03/16/2015, 11:21 AM

## 2015-03-16 NOTE — Progress Notes (Signed)
UR completed 

## 2015-03-16 NOTE — Progress Notes (Signed)
The scheduler at the patient's PCP was not in the office but PCP office stated they would call patient tomorrow to schedule an appointment.

## 2015-03-18 ENCOUNTER — Ambulatory Visit (HOSPITAL_COMMUNITY)
Admit: 2015-03-18 | Discharge: 2015-03-18 | Disposition: A | Discharge status: Home / Self Care | Payer: Medicare Other | Source: Ambulatory Visit | Attending: Neurology | Admitting: Neurology

## 2015-03-18 DIAGNOSIS — R55 Syncope and collapse: Secondary | ICD-10-CM | POA: Insufficient documentation

## 2015-03-18 DIAGNOSIS — G459 Transient cerebral ischemic attack, unspecified: Secondary | ICD-10-CM | POA: Diagnosis not present

## 2015-03-18 DIAGNOSIS — H538 Other visual disturbances: Secondary | ICD-10-CM | POA: Diagnosis not present

## 2015-03-18 DIAGNOSIS — R4182 Altered mental status, unspecified: Secondary | ICD-10-CM | POA: Diagnosis not present

## 2015-03-18 DIAGNOSIS — R569 Unspecified convulsions: Secondary | ICD-10-CM | POA: Diagnosis not present

## 2015-03-18 DIAGNOSIS — R42 Dizziness and giddiness: Secondary | ICD-10-CM | POA: Insufficient documentation

## 2015-03-18 NOTE — Progress Notes (Signed)
EEG Completed; Results Pending  

## 2015-03-19 NOTE — Procedures (Signed)
  Natoma A. Merlene Laughter, MD     [email or web address]           HISTORY: The patient is a 74 year old female who presents with episode of near syncope associated with dizziness, unresponsiveness and blurring of her vision. The spells suspicious for possible complex partial seizures.  MEDICATIONS: Scheduled Meds: Continuous Infusions: PRN Meds:.  Prior to Admission medications   Medication Sig Start Date End Date Taking? Authorizing Provider  acetaminophen (TYLENOL) 500 MG tablet Take 1,000 mg by mouth every 6 (six) hours as needed. For pain    Historical Provider, MD  atorvastatin (LIPITOR) 10 MG tablet Take 1 tablet (10 mg total) by mouth daily. 09/07/14   Pixie Casino, MD  B Complex Vitamins (B COMPLEX 1 PO) Take 1 tablet by mouth daily.    Historical Provider, MD  CALCIUM-VITAMIN D PO Take 1 tablet by mouth 2 (two) times daily.    Historical Provider, MD  diltiazem (CARDIZEM CD) 240 MG 24 hr capsule Take 1 capsule (240 mg total) by mouth daily. 10/06/14   Pixie Casino, MD  fish oil-omega-3 fatty acids 1000 MG capsule Take 1 g by mouth daily.    Historical Provider, MD  levothyroxine (SYNTHROID, LEVOTHROID) 88 MCG tablet Take 88 mcg by mouth daily.    Historical Provider, MD  lisinopril (PRINIVIL,ZESTRIL) 20 MG tablet Take 1 tablet (20 mg total) by mouth 2 (two) times daily. 10/06/14   Pixie Casino, MD  metoprolol succinate (TOPROL-XL) 50 MG 24 hr tablet Take 1 tablet (50 mg total) by mouth daily. Take with or immediately following a meal. 10/17/14   Pixie Casino, MD  Multiple Vitamin (MULTIVITAMIN WITH MINERALS) TABS Take 1 tablet by mouth daily.    Historical Provider, MD  tretinoin (RETIN-A) 0.05 % cream Apply 1 application topically daily.  09/30/14   Historical Provider, MD  warfarin (COUMADIN) 6 MG tablet TAKE 1 TABLET BY MOUTH ONCE DAILY OR AS DIRECTED BY COUMADIN CLINIC. 03/09/15   Pixie Casino, MD      ANALYSIS: A 16 channel recording using  standard 10 20 measurements is conducted for 23 minutes. There is a well-formed posterior dominant rhythm of 10 Hz which attenuates with eye opening. There is beta activity observed in the frontal areas. Awake and drowsy activities are observed. Photic examination is carried out without abnormal changes in the background activity. Hyperventilation is not carried out. There are no focal or lateral slowing. There is no epileptiform activity observed.   IMPRESSION: 1. This is a normal recording of the awake and drowsy states.      Lameshia Hypolite A. Merlene Laughter, M.D.  Diplomate, Tax adviser of Psychiatry and Neurology ( Neurology).

## 2015-03-22 ENCOUNTER — Ambulatory Visit (INDEPENDENT_AMBULATORY_CARE_PROVIDER_SITE_OTHER): Payer: Medicare Other | Admitting: *Deleted

## 2015-03-22 DIAGNOSIS — I480 Paroxysmal atrial fibrillation: Secondary | ICD-10-CM | POA: Diagnosis not present

## 2015-03-22 DIAGNOSIS — I4891 Unspecified atrial fibrillation: Secondary | ICD-10-CM | POA: Diagnosis not present

## 2015-03-22 DIAGNOSIS — Z7902 Long term (current) use of antithrombotics/antiplatelets: Secondary | ICD-10-CM | POA: Diagnosis not present

## 2015-03-22 LAB — POCT INR: INR: 1.7

## 2015-03-29 ENCOUNTER — Ambulatory Visit (INDEPENDENT_AMBULATORY_CARE_PROVIDER_SITE_OTHER): Payer: Medicare Other | Admitting: *Deleted

## 2015-03-29 DIAGNOSIS — Z7902 Long term (current) use of antithrombotics/antiplatelets: Secondary | ICD-10-CM

## 2015-03-29 DIAGNOSIS — I4891 Unspecified atrial fibrillation: Secondary | ICD-10-CM

## 2015-03-29 LAB — POCT INR: INR: 2

## 2015-03-30 DIAGNOSIS — Z6821 Body mass index (BMI) 21.0-21.9, adult: Secondary | ICD-10-CM | POA: Diagnosis not present

## 2015-03-30 DIAGNOSIS — G459 Transient cerebral ischemic attack, unspecified: Secondary | ICD-10-CM | POA: Diagnosis not present

## 2015-04-14 ENCOUNTER — Ambulatory Visit (INDEPENDENT_AMBULATORY_CARE_PROVIDER_SITE_OTHER): Payer: Medicare Other | Admitting: *Deleted

## 2015-04-14 DIAGNOSIS — I4891 Unspecified atrial fibrillation: Secondary | ICD-10-CM | POA: Diagnosis not present

## 2015-04-14 DIAGNOSIS — Z7902 Long term (current) use of antithrombotics/antiplatelets: Secondary | ICD-10-CM

## 2015-04-14 LAB — POCT INR: INR: 3.2

## 2015-05-05 ENCOUNTER — Ambulatory Visit (INDEPENDENT_AMBULATORY_CARE_PROVIDER_SITE_OTHER): Payer: Medicare Other | Admitting: *Deleted

## 2015-05-05 DIAGNOSIS — Z7902 Long term (current) use of antithrombotics/antiplatelets: Secondary | ICD-10-CM

## 2015-05-05 DIAGNOSIS — I4891 Unspecified atrial fibrillation: Secondary | ICD-10-CM

## 2015-05-05 LAB — POCT INR: INR: 2.8

## 2015-05-11 DIAGNOSIS — L820 Inflamed seborrheic keratosis: Secondary | ICD-10-CM | POA: Diagnosis not present

## 2015-05-11 DIAGNOSIS — Z85828 Personal history of other malignant neoplasm of skin: Secondary | ICD-10-CM | POA: Diagnosis not present

## 2015-05-11 DIAGNOSIS — D2271 Melanocytic nevi of right lower limb, including hip: Secondary | ICD-10-CM | POA: Diagnosis not present

## 2015-05-11 DIAGNOSIS — D2272 Melanocytic nevi of left lower limb, including hip: Secondary | ICD-10-CM | POA: Diagnosis not present

## 2015-05-11 DIAGNOSIS — Z85820 Personal history of malignant melanoma of skin: Secondary | ICD-10-CM | POA: Diagnosis not present

## 2015-05-11 DIAGNOSIS — D1801 Hemangioma of skin and subcutaneous tissue: Secondary | ICD-10-CM | POA: Diagnosis not present

## 2015-05-11 DIAGNOSIS — L817 Pigmented purpuric dermatosis: Secondary | ICD-10-CM | POA: Diagnosis not present

## 2015-05-14 DIAGNOSIS — H40013 Open angle with borderline findings, low risk, bilateral: Secondary | ICD-10-CM | POA: Diagnosis not present

## 2015-05-14 DIAGNOSIS — Z961 Presence of intraocular lens: Secondary | ICD-10-CM | POA: Diagnosis not present

## 2015-05-14 DIAGNOSIS — H524 Presbyopia: Secondary | ICD-10-CM | POA: Diagnosis not present

## 2015-05-28 ENCOUNTER — Other Ambulatory Visit: Payer: Self-pay | Admitting: Internal Medicine

## 2015-05-28 NOTE — Telephone Encounter (Signed)
Rx(s) sent to pharmacy electronically.  

## 2015-05-31 ENCOUNTER — Other Ambulatory Visit: Payer: Self-pay

## 2015-06-09 ENCOUNTER — Ambulatory Visit (INDEPENDENT_AMBULATORY_CARE_PROVIDER_SITE_OTHER): Payer: Medicare Other | Admitting: *Deleted

## 2015-06-09 DIAGNOSIS — Z7902 Long term (current) use of antithrombotics/antiplatelets: Secondary | ICD-10-CM | POA: Diagnosis not present

## 2015-06-09 DIAGNOSIS — I4891 Unspecified atrial fibrillation: Secondary | ICD-10-CM | POA: Diagnosis not present

## 2015-06-09 LAB — POCT INR: INR: 3.2

## 2015-06-30 ENCOUNTER — Ambulatory Visit (INDEPENDENT_AMBULATORY_CARE_PROVIDER_SITE_OTHER): Payer: Medicare Other | Admitting: *Deleted

## 2015-06-30 DIAGNOSIS — I4891 Unspecified atrial fibrillation: Secondary | ICD-10-CM | POA: Diagnosis not present

## 2015-06-30 DIAGNOSIS — Z7902 Long term (current) use of antithrombotics/antiplatelets: Secondary | ICD-10-CM | POA: Diagnosis not present

## 2015-06-30 LAB — POCT INR: INR: 2.4

## 2015-07-10 ENCOUNTER — Other Ambulatory Visit: Payer: Self-pay | Admitting: Internal Medicine

## 2015-07-12 NOTE — Telephone Encounter (Signed)
Rx(s) sent to pharmacy electronically.  

## 2015-07-28 ENCOUNTER — Ambulatory Visit (INDEPENDENT_AMBULATORY_CARE_PROVIDER_SITE_OTHER): Payer: Medicare Other | Admitting: *Deleted

## 2015-07-28 DIAGNOSIS — Z7902 Long term (current) use of antithrombotics/antiplatelets: Secondary | ICD-10-CM

## 2015-07-28 DIAGNOSIS — I4891 Unspecified atrial fibrillation: Secondary | ICD-10-CM | POA: Diagnosis not present

## 2015-07-28 LAB — POCT INR: INR: 3

## 2015-07-31 ENCOUNTER — Other Ambulatory Visit: Payer: Self-pay | Admitting: Internal Medicine

## 2015-08-02 NOTE — Telephone Encounter (Signed)
REFILL 

## 2015-08-10 DIAGNOSIS — L218 Other seborrheic dermatitis: Secondary | ICD-10-CM | POA: Diagnosis not present

## 2015-08-10 DIAGNOSIS — Z85820 Personal history of malignant melanoma of skin: Secondary | ICD-10-CM | POA: Diagnosis not present

## 2015-08-10 DIAGNOSIS — Z85828 Personal history of other malignant neoplasm of skin: Secondary | ICD-10-CM | POA: Diagnosis not present

## 2015-08-25 ENCOUNTER — Ambulatory Visit (INDEPENDENT_AMBULATORY_CARE_PROVIDER_SITE_OTHER): Payer: Medicare Other | Admitting: *Deleted

## 2015-08-25 DIAGNOSIS — Z7902 Long term (current) use of antithrombotics/antiplatelets: Secondary | ICD-10-CM | POA: Diagnosis not present

## 2015-08-25 DIAGNOSIS — I4891 Unspecified atrial fibrillation: Secondary | ICD-10-CM

## 2015-08-25 LAB — POCT INR: INR: 2

## 2015-09-23 ENCOUNTER — Encounter: Payer: Self-pay | Admitting: Internal Medicine

## 2015-09-23 ENCOUNTER — Ambulatory Visit (INDEPENDENT_AMBULATORY_CARE_PROVIDER_SITE_OTHER): Payer: Medicare Other | Admitting: Internal Medicine

## 2015-09-23 VITALS — BP 128/88 | HR 75 | Ht 63.0 in | Wt 117.1 lb

## 2015-09-23 DIAGNOSIS — Z7902 Long term (current) use of antithrombotics/antiplatelets: Secondary | ICD-10-CM

## 2015-09-23 DIAGNOSIS — I10 Essential (primary) hypertension: Secondary | ICD-10-CM

## 2015-09-23 DIAGNOSIS — Z23 Encounter for immunization: Secondary | ICD-10-CM

## 2015-09-23 DIAGNOSIS — I4891 Unspecified atrial fibrillation: Secondary | ICD-10-CM | POA: Diagnosis not present

## 2015-09-23 DIAGNOSIS — E785 Hyperlipidemia, unspecified: Secondary | ICD-10-CM

## 2015-09-23 MED ORDER — DILTIAZEM HCL ER COATED BEADS 240 MG PO CP24: ORAL_CAPSULE | ORAL | Status: DC

## 2015-09-23 MED ORDER — ATORVASTATIN CALCIUM 10 MG PO TABS: 10.0000 mg | ORAL_TABLET | Freq: Every day | ORAL | Status: DC

## 2015-09-23 MED ORDER — LISINOPRIL 20 MG PO TABS: ORAL_TABLET | ORAL | Status: DC

## 2015-09-23 MED ORDER — METOPROLOL SUCCINATE ER 50 MG PO TB24: 50.0000 mg | ORAL_TABLET | Freq: Every day | ORAL | Status: DC

## 2015-09-23 NOTE — Progress Notes (Signed)
OFFICE NOTE  Chief Complaint: Routine follow-up  Primary Care Physician: Rocky Morel, MD  HPI:  Kathleen Mcconnell is a 74 year old female with a history of paroxysmal atrial fibrillation, on sotalol, who had a cardioversion in 2004 and had not had recurrence until recently. She also has signs of rheumatic heart disease on her echocardiogram with some sclerosis of the aortic valve, and moderate to severe TR and mild MR with a flat coaptation on the mitral valve. In addition, her EF is greater than 55% and she has normal atrial sizes. Recently, she had presented to the office for an annual followup and was found to be in atrial fibrillation with controlled ventricular response. She was clearly unaware that she was in atrial fibrillation; however, she did note that she was getting a little more short of breath when walking up stairs and had been a little bit more fatigued recently. She has been maintained on warfarin and has done fairly well with maintaining a therapeutic INR. She is also on diltiazem additionally for rate control and also for hypertension. I, therefore, recommended increasing her sotalol to 120 mg twice daily and set her up for a cardioversion. She underwent cardioversion on November 19, 2012, with a single 150-joule biphasic shock. She converted to a sinus rhythm and afterwards felt very well. This persisted for several weeks until recently; she started to feel tired again, not nearly as good as shortly after the procedure was performed. On followup visit, she was noted again to be in atrial fibrillation, now with slow ventricular response at a rate of 56. She claimed to be fairly symptomatic and therefore refer her to see Dr. Rayann Heman with cardiac electrophysiology. He evaluated her and felt that due to her severe tricuspid regurgitation, she was not a good candidate for atrial fibrillation ablation, as well as the fact that she is not symptomatic.  He also recommended  discontinuing sotalol and pursuing rate control. She has since transitioned over to her warfarin checks with [facility] in [place].  Kathleen Mcconnell returns today for follow-up. She is noted to be in A. fib with mild rapid ventricular response today and rates in the low 100s. There is a question about her being on both ACE and ARB which was started over 10 years ago. Although this is not current practice I have continued on this medicine because he seems to be doing well, however it is somewhat redundant. She denies any chest pain or worsening shortness of breath. I do believe she could have better rate control.  I saw Kathleen Mcconnell back today in the office for follow-up. Overall she's feeling well. She remains rate controlled in permanent A. fib. Her INRs have been therapeutic except for one short period this summer. Apparently she was seen in the ER for what was thought to be a TIA event. Her INR was slightly low however cardio workup was negative. She ultimately was reestablished to a therapeutic level of warfarin and has done well since then.  PMHx:  Past Medical History  Diagnosis Date  . Hypertension   . Persistent atrial fibrillation     History of; cardioversion in 2004  . Severe tricuspid regurgitation     on echo 02/2011 ?rheumatic heart disease  . Thyroid mass 10/10/2001    benign lesion removed  . Hypothyroidism   . Hyperlipidemia   . Mild aortic insufficiency   . Mitral valve regurgitation   . History of nuclear stress test 03/2012    lexiscan; normal  pattern of perfusion; normal, low risk     Past Surgical History  Procedure Laterality Date  . Thyroidectomy  10/10/2001  . Cardioversion N/A 01/20/2013    Procedure: CARDIOVERSION;  Surgeon: Pixie Casino, MD;  Location: [facility] ENDOSCOPY;  Service: Cardiovascular;  Laterality: N/A;  . Transthoracic echocardiogram  02/2011    EF=>55%; mild MR; mod-severe TR & elevated RV systolic pressure, mild pulm HTN; aortic valve mildly sclerotic with  mild regurg  . Cardiac catheterization  9211    normal systolic function, near normal coronaries     FAMHx:  Family History  Problem Relation Age of Onset  . Heart disease Father     also MI  . Heart disease Brother     x 3, one deceased age 37  . Heart disease Sister     SOCHx:   reports that she has never smoked. She has never used smokeless tobacco. She reports that she does not drink alcohol or use illicit drugs.  ALLERGIES:  Allergies  Allergen Reactions  . Aspirin Anaphylaxis, Hives, Rash and Other (See Comments)    As well as short of breath; Previously was rushed to hospital-given epinephrine injection.     ROS: A comprehensive review of systems was negative.  HOME MEDS: Current Outpatient Prescriptions  Medication Sig Dispense Refill  . acetaminophen (TYLENOL) 500 MG tablet Take 1,000 mg by mouth every 6 (six) hours as needed. For pain    . atorvastatin (LIPITOR) 10 MG tablet Take 1 tablet (10 mg total) by mouth daily. 90 tablet 3  . B Complex Vitamins (B COMPLEX 1 PO) Take 1 tablet by mouth daily.    Marland Kitchen CALCIUM-VITAMIN D PO Take 1 tablet by mouth 2 (two) times daily.    Marland Kitchen diltiazem (CARDIZEM CD) 240 MG 24 hr capsule TAKE (1) CAPSULE BY MOUTH ONCE DAILY. 90 capsule 3  . fish oil-omega-3 fatty acids 1000 MG capsule Take 1 g by mouth daily.    Marland Kitchen levothyroxine (SYNTHROID, LEVOTHROID) 88 MCG tablet Take 88 mcg by mouth daily.    Marland Kitchen lisinopril (PRINIVIL,ZESTRIL) 20 MG tablet TAKE (1) TABLET BY MOUTH TWICE DAILY. 180 tablet 3  . metoprolol succinate (TOPROL-XL) 50 MG 24 hr tablet Take 1 tablet (50 mg total) by mouth daily. 90 tablet 3  . Multiple Vitamin (MULTIVITAMIN WITH MINERALS) TABS Take 1 tablet by mouth daily.    Marland Kitchen tretinoin (RETIN-A) 0.05 % cream Apply 1 application topically daily.     Marland Kitchen warfarin (COUMADIN) 6 MG tablet TAKE 1 TABLET BY MOUTH ONCE DAILY OR AS DIRECTED BY COUMADIN CLINIC. 30 tablet 5   No current facility-administered medications for this visit.     LABS/IMAGING: No results found for this or any previous visit (from the past 48 hour(s)). No results found.  VITALS: BP 128/88 mmHg  Pulse 75  Ht 5\' 3"  (1.6 m)  Wt 117 lb 1.6 oz (53.116 kg)  BMI 20.75 kg/m2  EXAM: General appearance: alert and no distress Neck: no adenopathy, no carotid bruit, no JVD, supple, symmetrical, trachea midline and thyroid not enlarged, symmetric, no tenderness/mass/nodules Lungs: clear to auscultation bilaterally Heart: regular rate and rhythm, S1, S2 normal, no murmur, click, rub or gallop Abdomen: soft, non-tender; bowel sounds normal; no masses,  no organomegaly Extremities: extremities normal, atraumatic, no cyanosis or edema Pulses: 2+ and symmetric Skin: Skin color, texture, turgor normal. No rashes or lesions Neurologic: Grossly normal  EKG: Atrial fibrillation with CVR at 75  ASSESSMENT: 1. Permanent atrial fibrillation-asymptomatic 2. Hypertension-controlled  3. Long-term anticoagulation on warfarin 4. Recent TIA  PLAN: 1.   Kathleen Mcconnell has permanent atrial fibrillation. Rate is controlled. Her blood pressures at goal. She is therapeutic on warfarin however was not totally therapeutic this summer and may have experienced a TIA event secondary to that. I would agree with a goal INR between 2.5 and 3 if possible to keep her stroke risk down. Overall she is doing well and encouraged her to continue to stay active and be happy see her back annually or sooner as necessary. Also, she requested and we provided a flu vaccine today in the office.  Pixie Casino, MD, [facility] Attending Cardiologist [place] 09/23/2015, 12:50 PM

## 2015-09-23 NOTE — Patient Instructions (Signed)
Your physician wants you to follow-up in: 1 year with Dr. Debara Pickett. You will receive a reminder letter in the mail two months in advance. If you don't receive a letter, please call our office to schedule the follow-up appointment.  If you need a refill on your cardiac medications before your next appointment, please call your pharmacy.

## 2015-10-06 ENCOUNTER — Ambulatory Visit (INDEPENDENT_AMBULATORY_CARE_PROVIDER_SITE_OTHER): Payer: Medicare Other | Admitting: *Deleted

## 2015-10-06 DIAGNOSIS — Z7902 Long term (current) use of antithrombotics/antiplatelets: Secondary | ICD-10-CM | POA: Diagnosis not present

## 2015-10-06 DIAGNOSIS — I482 Chronic atrial fibrillation: Secondary | ICD-10-CM

## 2015-10-06 DIAGNOSIS — I4891 Unspecified atrial fibrillation: Secondary | ICD-10-CM

## 2015-10-06 DIAGNOSIS — I4821 Permanent atrial fibrillation: Secondary | ICD-10-CM

## 2015-10-06 LAB — POCT INR: INR: 1.5

## 2015-10-09 ENCOUNTER — Other Ambulatory Visit: Payer: Self-pay | Admitting: Internal Medicine

## 2015-10-20 ENCOUNTER — Ambulatory Visit (INDEPENDENT_AMBULATORY_CARE_PROVIDER_SITE_OTHER): Payer: Medicare Other | Admitting: *Deleted

## 2015-10-20 DIAGNOSIS — I4891 Unspecified atrial fibrillation: Secondary | ICD-10-CM | POA: Diagnosis not present

## 2015-10-20 DIAGNOSIS — Z7902 Long term (current) use of antithrombotics/antiplatelets: Secondary | ICD-10-CM | POA: Diagnosis not present

## 2015-10-20 LAB — POCT INR: INR: 2.6

## 2015-10-24 IMAGING — US US CAROTID DUPLEX BILAT
1 series · 13 of 24 positions shown · non-contrast
Comparison: None.

CLINICAL DATA: TIA with blurred vision. History of hypertension,
hyperlipidemia and diabetes. History of atrial fibrillation.

EXAM:
BILATERAL CAROTID DUPLEX ULTRASOUND
TECHNIQUE: Gray scale imaging, color Doppler and duplex ultrasound were
performed of bilateral carotid and vertebral arteries in the neck.

[Series 1: us carotid duplex bilat · 0.04mm/px · 13 of 93 slices shown]
[im 1/93]
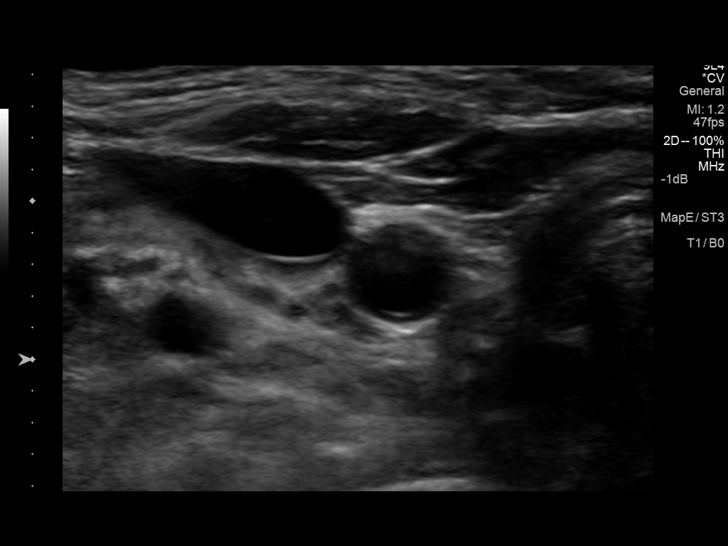
[im 9/93]
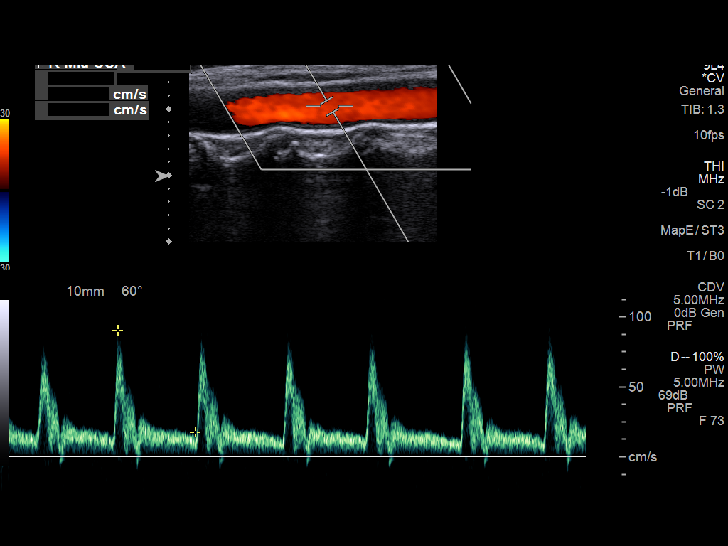
[im 17/93]
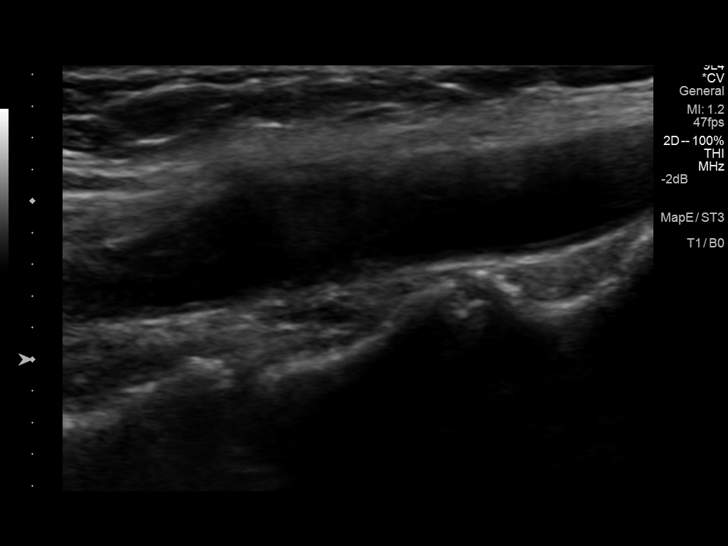
[im 25/93]
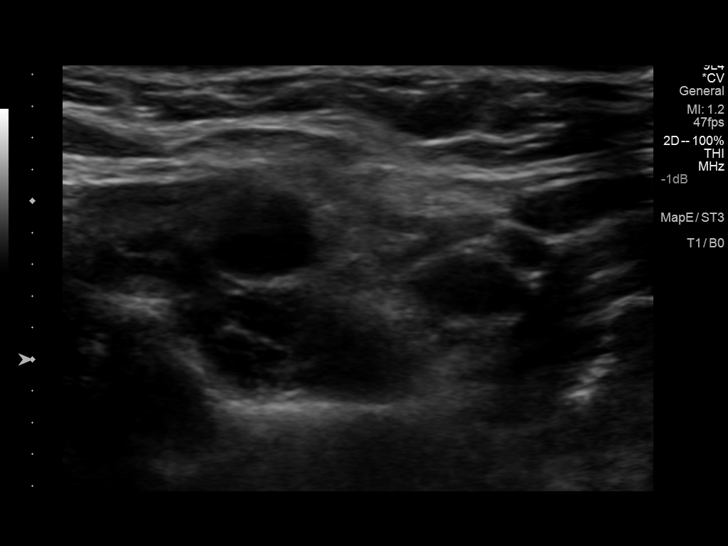
[im 33/93]
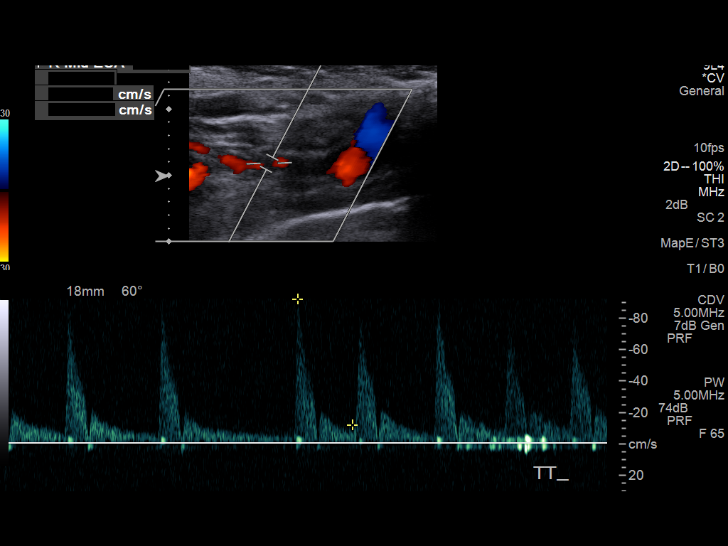
[im 41/93]
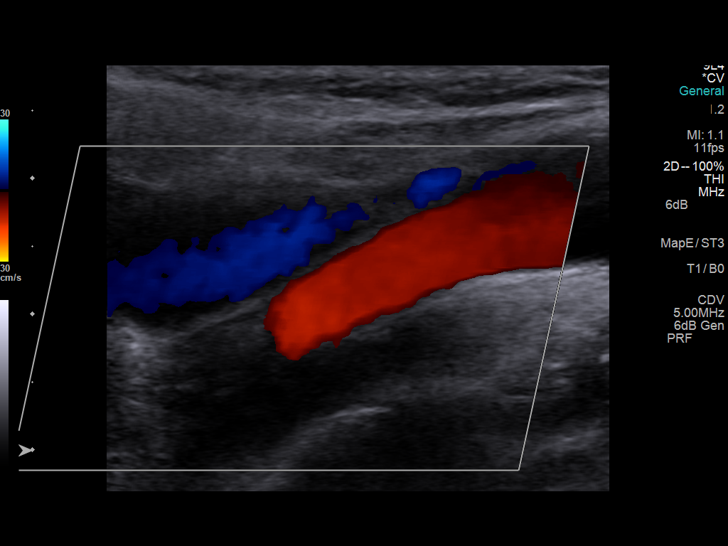
[im 49/93]
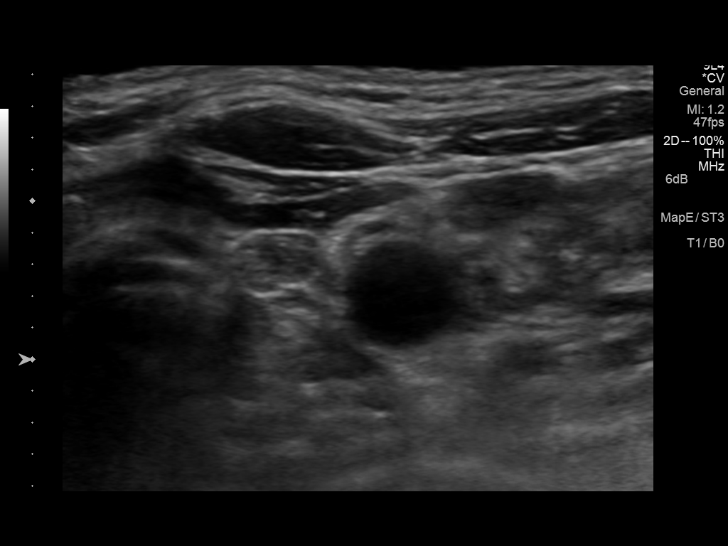
[im 53/93]
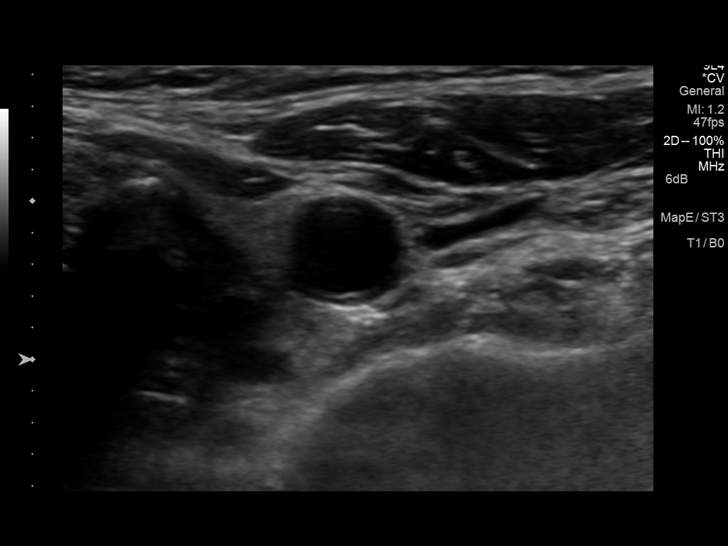
[im 61/93]
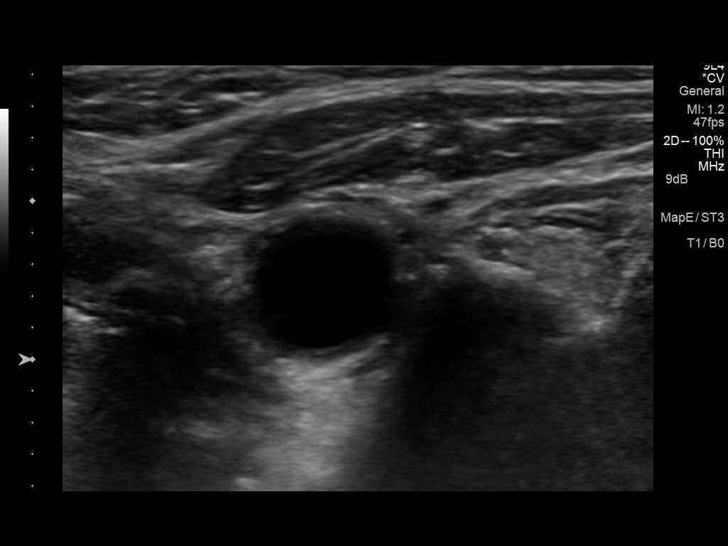
[im 69/93]
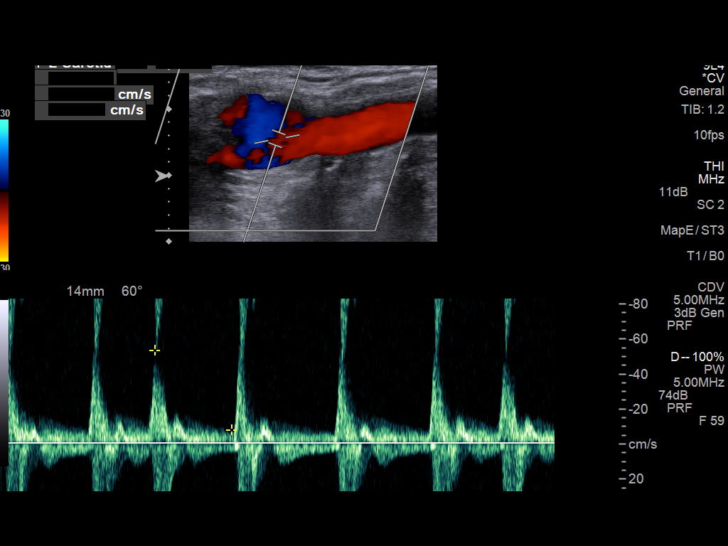
[im 77/93]
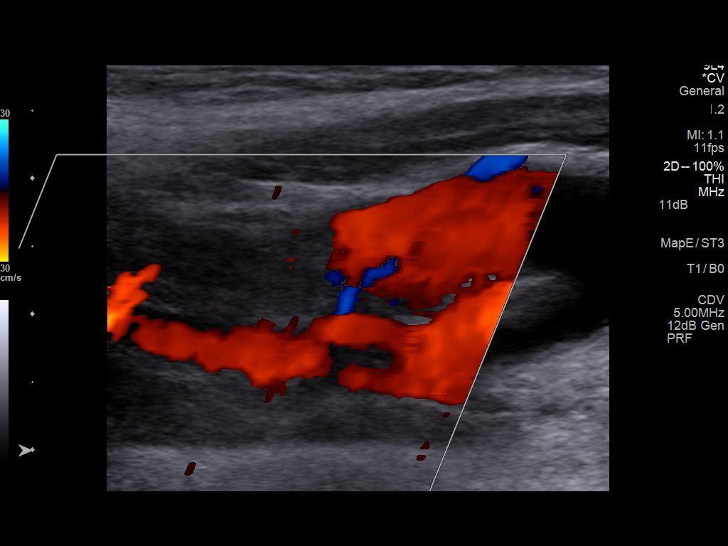
[im 85/93]
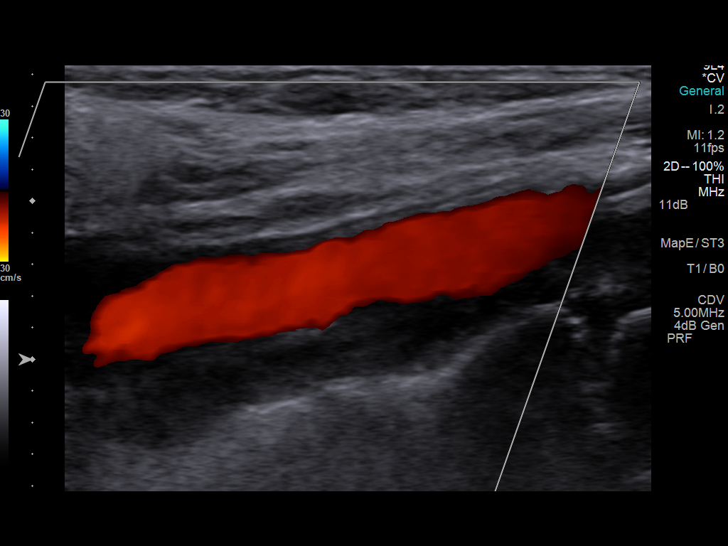
[im 93/93]
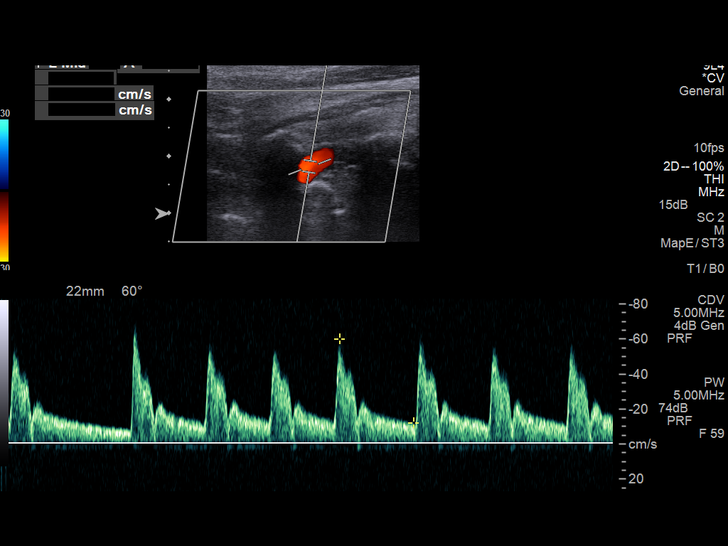

[13 of 24 positions shown; findings below may reference images not displayed]

FINDINGS: Criteria: Quantification of carotid stenosis is based on velocity
parameters that correlate the residual internal carotid diameter
with NASCET-based stenosis levels, using the diameter of the distal
internal carotid lumen as the denominator for stenosis measurement.

The following velocity measurements were obtained:

RIGHT

ICA:  73/18 cm/sec

CCA:  90/18 cm/sec

SYSTOLIC ICA/CCA RATIO:

DIASTOLIC ICA/CCA RATIO:

ECA:  92 cm/sec

LEFT

ICA:  81/22 cm/sec

CCA:  107/19 cm/sec

SYSTOLIC ICA/CCA RATIO:

DIASTOLIC ICA/CCA RATIO:

ECA:  83 cm/sec

RIGHT CAROTID ARTERY: The common carotid artery shows mild intimal
thickening. Mild mixed plaque noted at the level of the carotid bulb
and proximal ICA. Velocities and waveforms are normal and estimated
right ICA stenosis is less than 50%.

RIGHT VERTEBRAL ARTERY: Antegrade flow with normal waveform and
velocity.

LEFT CAROTID ARTERY: The common carotid artery demonstrates mild
intimal thickening. There is suggestion of minimal heterogeneous
plaque at the level of the carotid bulb extending up to the ICA
origin. Velocities and waveforms are normal and estimated left ICA
stenosis is less than 50%.

LEFT VERTEBRAL ARTERY: Antegrade flow with normal waveform and
velocity.
IMPRESSION: Mild plaque at the level of both carotid bulbs and proximal internal
carotid arteries. No significant carotid stenosis is identified with
estimated bilateral ICA stenoses of less than 50%.

## 2015-11-15 ENCOUNTER — Ambulatory Visit (INDEPENDENT_AMBULATORY_CARE_PROVIDER_SITE_OTHER): Payer: Medicare Other | Admitting: *Deleted

## 2015-11-15 DIAGNOSIS — Z7902 Long term (current) use of antithrombotics/antiplatelets: Secondary | ICD-10-CM

## 2015-11-15 DIAGNOSIS — I4891 Unspecified atrial fibrillation: Secondary | ICD-10-CM | POA: Diagnosis not present

## 2015-11-15 LAB — POCT INR: INR: 3.4

## 2015-12-13 ENCOUNTER — Ambulatory Visit (INDEPENDENT_AMBULATORY_CARE_PROVIDER_SITE_OTHER): Payer: Medicare Other | Admitting: *Deleted

## 2015-12-13 DIAGNOSIS — Z7902 Long term (current) use of antithrombotics/antiplatelets: Secondary | ICD-10-CM | POA: Diagnosis not present

## 2015-12-13 DIAGNOSIS — I4891 Unspecified atrial fibrillation: Secondary | ICD-10-CM | POA: Diagnosis not present

## 2015-12-13 LAB — POCT INR: INR: 3.6

## 2015-12-29 DIAGNOSIS — Z1231 Encounter for screening mammogram for malignant neoplasm of breast: Secondary | ICD-10-CM | POA: Diagnosis not present

## 2016-01-03 ENCOUNTER — Ambulatory Visit (INDEPENDENT_AMBULATORY_CARE_PROVIDER_SITE_OTHER): Payer: Medicare Other | Admitting: *Deleted

## 2016-01-03 DIAGNOSIS — Z7902 Long term (current) use of antithrombotics/antiplatelets: Secondary | ICD-10-CM | POA: Diagnosis not present

## 2016-01-03 DIAGNOSIS — I4891 Unspecified atrial fibrillation: Secondary | ICD-10-CM

## 2016-01-03 LAB — POCT INR: INR: 2.3

## 2016-02-02 ENCOUNTER — Ambulatory Visit (INDEPENDENT_AMBULATORY_CARE_PROVIDER_SITE_OTHER): Payer: Medicare Other | Admitting: *Deleted

## 2016-02-02 DIAGNOSIS — Z7902 Long term (current) use of antithrombotics/antiplatelets: Secondary | ICD-10-CM

## 2016-02-02 DIAGNOSIS — I4891 Unspecified atrial fibrillation: Secondary | ICD-10-CM

## 2016-02-02 LAB — POCT INR: INR: 2.8

## 2016-02-08 DIAGNOSIS — Z6820 Body mass index (BMI) 20.0-20.9, adult: Secondary | ICD-10-CM | POA: Diagnosis not present

## 2016-02-08 DIAGNOSIS — I10 Essential (primary) hypertension: Secondary | ICD-10-CM | POA: Diagnosis not present

## 2016-02-08 DIAGNOSIS — Z1389 Encounter for screening for other disorder: Secondary | ICD-10-CM | POA: Diagnosis not present

## 2016-02-08 DIAGNOSIS — Z0001 Encounter for general adult medical examination with abnormal findings: Secondary | ICD-10-CM | POA: Diagnosis not present

## 2016-02-11 ENCOUNTER — Other Ambulatory Visit (HOSPITAL_COMMUNITY): Payer: Self-pay | Admitting: Family Medicine

## 2016-02-11 DIAGNOSIS — M810 Age-related osteoporosis without current pathological fracture: Secondary | ICD-10-CM

## 2016-02-17 ENCOUNTER — Ambulatory Visit (HOSPITAL_COMMUNITY)
Admission: RE | Admit: 2016-02-17 | Discharge: 2016-02-17 | Disposition: A | Discharge status: Home / Self Care | Payer: Medicare Other | Source: Ambulatory Visit | Attending: Family Medicine | Admitting: Family Medicine

## 2016-02-17 DIAGNOSIS — M810 Age-related osteoporosis without current pathological fracture: Secondary | ICD-10-CM | POA: Insufficient documentation

## 2016-02-17 DIAGNOSIS — M8589 Other specified disorders of bone density and structure, multiple sites: Secondary | ICD-10-CM | POA: Diagnosis not present

## 2016-03-08 ENCOUNTER — Ambulatory Visit (INDEPENDENT_AMBULATORY_CARE_PROVIDER_SITE_OTHER): Payer: Medicare Other | Admitting: *Deleted

## 2016-03-08 DIAGNOSIS — Z7902 Long term (current) use of antithrombotics/antiplatelets: Secondary | ICD-10-CM

## 2016-03-08 DIAGNOSIS — I4891 Unspecified atrial fibrillation: Secondary | ICD-10-CM

## 2016-03-08 LAB — POCT INR: INR: 2.6

## 2016-03-20 DIAGNOSIS — Z1389 Encounter for screening for other disorder: Secondary | ICD-10-CM | POA: Diagnosis not present

## 2016-03-20 DIAGNOSIS — I10 Essential (primary) hypertension: Secondary | ICD-10-CM | POA: Diagnosis not present

## 2016-03-20 DIAGNOSIS — I4891 Unspecified atrial fibrillation: Secondary | ICD-10-CM | POA: Diagnosis not present

## 2016-03-20 DIAGNOSIS — Z6820 Body mass index (BMI) 20.0-20.9, adult: Secondary | ICD-10-CM | POA: Diagnosis not present

## 2016-03-20 DIAGNOSIS — Z23 Encounter for immunization: Secondary | ICD-10-CM | POA: Diagnosis not present

## 2016-03-20 DIAGNOSIS — E039 Hypothyroidism, unspecified: Secondary | ICD-10-CM | POA: Diagnosis not present

## 2016-03-20 DIAGNOSIS — E782 Mixed hyperlipidemia: Secondary | ICD-10-CM | POA: Diagnosis not present

## 2016-03-22 DIAGNOSIS — Z23 Encounter for immunization: Secondary | ICD-10-CM | POA: Diagnosis not present

## 2016-03-22 DIAGNOSIS — E782 Mixed hyperlipidemia: Secondary | ICD-10-CM | POA: Diagnosis not present

## 2016-03-22 DIAGNOSIS — I10 Essential (primary) hypertension: Secondary | ICD-10-CM | POA: Diagnosis not present

## 2016-03-22 DIAGNOSIS — Z1389 Encounter for screening for other disorder: Secondary | ICD-10-CM | POA: Diagnosis not present

## 2016-03-31 ENCOUNTER — Telehealth: Payer: Self-pay | Admitting: Internal Medicine

## 2016-03-31 NOTE — Telephone Encounter (Signed)
NEW MESSAGE    Pt states she feels that she is having indigestion  But she is not sure please call RN

## 2016-03-31 NOTE — Telephone Encounter (Signed)
Pt of Dr. Debara Pickett  Returned call to patient. She's been having some discomfort, upper abd & chest. States several weeks - indicates no urgency for concerns, but wanted to check w/ [place] to be sure.  Reports this feels like indigestion, but notes she has not had this a lot before. Discomfort is intermittent, burning, mild in severity. She reports soreness afterwards. States relief w/ drinking ginger ale and belching.  She denies SOB, neck, jaw or arm pain, dizziness or lightheadedness. No change in energy level.  I acknowledged her symptoms sound like indigestion/reflux but would defer to Dr. Debara Pickett for recommendation. Gave instructions on reporting to ED or calling if new or worse symptoms.

## 2016-04-01 NOTE — Telephone Encounter (Signed)
Agree, based on your description - try OTC anti-acids or discuss with PCP.  Dr. Lemmie Evens

## 2016-04-03 NOTE — Telephone Encounter (Signed)
Left msg to call.

## 2016-04-06 NOTE — Telephone Encounter (Signed)
Called pt and spoke w/ her concerning recommendations. She voiced understanding and states symptoms already resolved, but if this happens again she will follow recommendations. Advised to call if she has new symptoms, she acknowledged.

## 2016-04-19 ENCOUNTER — Ambulatory Visit (INDEPENDENT_AMBULATORY_CARE_PROVIDER_SITE_OTHER): Payer: Medicare Other | Admitting: *Deleted

## 2016-04-19 DIAGNOSIS — Z7902 Long term (current) use of antithrombotics/antiplatelets: Secondary | ICD-10-CM

## 2016-04-19 DIAGNOSIS — I4891 Unspecified atrial fibrillation: Secondary | ICD-10-CM

## 2016-04-19 LAB — POCT INR: INR: 3

## 2016-05-11 DIAGNOSIS — D1801 Hemangioma of skin and subcutaneous tissue: Secondary | ICD-10-CM | POA: Diagnosis not present

## 2016-05-11 DIAGNOSIS — L218 Other seborrheic dermatitis: Secondary | ICD-10-CM | POA: Diagnosis not present

## 2016-05-11 DIAGNOSIS — L821 Other seborrheic keratosis: Secondary | ICD-10-CM | POA: Diagnosis not present

## 2016-05-11 DIAGNOSIS — L720 Epidermal cyst: Secondary | ICD-10-CM | POA: Diagnosis not present

## 2016-05-11 DIAGNOSIS — Z85820 Personal history of malignant melanoma of skin: Secondary | ICD-10-CM | POA: Diagnosis not present

## 2016-05-11 DIAGNOSIS — D2271 Melanocytic nevi of right lower limb, including hip: Secondary | ICD-10-CM | POA: Diagnosis not present

## 2016-05-11 DIAGNOSIS — Z85828 Personal history of other malignant neoplasm of skin: Secondary | ICD-10-CM | POA: Diagnosis not present

## 2016-05-11 DIAGNOSIS — D2272 Melanocytic nevi of left lower limb, including hip: Secondary | ICD-10-CM | POA: Diagnosis not present

## 2016-05-15 DIAGNOSIS — H524 Presbyopia: Secondary | ICD-10-CM | POA: Diagnosis not present

## 2016-05-15 DIAGNOSIS — Z961 Presence of intraocular lens: Secondary | ICD-10-CM | POA: Diagnosis not present

## 2016-05-31 ENCOUNTER — Ambulatory Visit (INDEPENDENT_AMBULATORY_CARE_PROVIDER_SITE_OTHER): Payer: Medicare Other | Admitting: *Deleted

## 2016-05-31 DIAGNOSIS — I4891 Unspecified atrial fibrillation: Secondary | ICD-10-CM | POA: Diagnosis not present

## 2016-05-31 DIAGNOSIS — Z7902 Long term (current) use of antithrombotics/antiplatelets: Secondary | ICD-10-CM | POA: Diagnosis not present

## 2016-05-31 LAB — POCT INR: INR: 2.2

## 2016-06-13 ENCOUNTER — Telehealth: Payer: Self-pay | Admitting: Internal Medicine

## 2016-06-13 NOTE — Telephone Encounter (Signed)
New message   would like to be seen   Pt c/o swelling: STAT is pt has developed SOB within 24 hours  1. How long have you been experiencing swelling? Daughter stating   - for a long time - getting worse   2. Where is the swelling located? Daughter stating  -  Ankles   3.  Are you currently taking a "fluid pill"? Daughter stating - no   4.  Are you currently SOB? Daughter stating - some   5.  Have you traveled recently? No

## 2016-06-13 NOTE — Telephone Encounter (Signed)
Spoke with daughter and patient has had some increased swelling in her ankles, thinks it goes down at night Patient is still doing all of her normal activities and very strict with her sodium intake Gave daughter appointment with Dr Debara Pickett 07/13/16, advised to call back if worse Daughter very appreciative and did not feel appointment urgent

## 2016-06-14 NOTE — Telephone Encounter (Signed)
Thanks - Dr H 

## 2016-06-26 ENCOUNTER — Other Ambulatory Visit: Payer: Self-pay | Admitting: Internal Medicine

## 2016-07-01 ENCOUNTER — Other Ambulatory Visit: Payer: Self-pay | Admitting: Internal Medicine

## 2016-07-12 ENCOUNTER — Encounter: Payer: Self-pay | Admitting: Internal Medicine

## 2016-07-12 ENCOUNTER — Ambulatory Visit (INDEPENDENT_AMBULATORY_CARE_PROVIDER_SITE_OTHER): Payer: Medicare Other | Admitting: *Deleted

## 2016-07-12 DIAGNOSIS — Z7902 Long term (current) use of antithrombotics/antiplatelets: Secondary | ICD-10-CM | POA: Diagnosis not present

## 2016-07-12 DIAGNOSIS — I4891 Unspecified atrial fibrillation: Secondary | ICD-10-CM | POA: Diagnosis not present

## 2016-07-12 LAB — POCT INR: INR: 2.3

## 2016-07-13 ENCOUNTER — Encounter: Payer: Self-pay | Admitting: Internal Medicine

## 2016-07-13 ENCOUNTER — Ambulatory Visit (INDEPENDENT_AMBULATORY_CARE_PROVIDER_SITE_OTHER): Payer: Medicare Other | Admitting: Internal Medicine

## 2016-07-13 VITALS — BP 144/86 | HR 72 | Ht 63.0 in | Wt 116.0 lb

## 2016-07-13 DIAGNOSIS — R0602 Shortness of breath: Secondary | ICD-10-CM | POA: Insufficient documentation

## 2016-07-13 DIAGNOSIS — E785 Hyperlipidemia, unspecified: Secondary | ICD-10-CM

## 2016-07-13 DIAGNOSIS — R600 Localized edema: Secondary | ICD-10-CM | POA: Insufficient documentation

## 2016-07-13 DIAGNOSIS — Z7901 Long term (current) use of anticoagulants: Secondary | ICD-10-CM

## 2016-07-13 DIAGNOSIS — I10 Essential (primary) hypertension: Secondary | ICD-10-CM

## 2016-07-13 DIAGNOSIS — I482 Chronic atrial fibrillation: Secondary | ICD-10-CM

## 2016-07-13 DIAGNOSIS — I4821 Permanent atrial fibrillation: Secondary | ICD-10-CM

## 2016-07-13 NOTE — Progress Notes (Signed)
OFFICE NOTE  Chief Complaint: Dyspnea, lower extremity edema  Primary Care Physician: Purvis Kilts, MD  HPI:  Kathleen Mcconnell is a 75 year old female with a history of paroxysmal atrial fibrillation, on sotalol, who had a cardioversion in 2004 and had not had recurrence until recently. She also has signs of rheumatic heart disease on her echocardiogram with some sclerosis of the aortic valve, and moderate to severe TR and mild MR with a flat coaptation on the mitral valve. In addition, her EF is greater than 55% and she has normal atrial sizes. Recently, she had presented to the office for an annual followup and was found to be in atrial fibrillation with controlled ventricular response. She was clearly unaware that she was in atrial fibrillation; however, she did note that she was getting a little more short of breath when walking up stairs and had been a little bit more fatigued recently. She has been maintained on warfarin and has done fairly well with maintaining a therapeutic INR. She is also on diltiazem additionally for rate control and also for hypertension. I, therefore, recommended increasing her sotalol to 120 mg twice daily and set her up for a cardioversion. She underwent cardioversion on November 19, 2012, with a single 150-joule biphasic shock. She converted to a sinus rhythm and afterwards felt very well. This persisted for several weeks until recently; she started to feel tired again, not nearly as good as shortly after the procedure was performed. On followup visit, she was noted again to be in atrial fibrillation, now with slow ventricular response at a rate of 56. She claimed to be fairly symptomatic and therefore refer her to see Dr. Rayann Heman with cardiac electrophysiology. He evaluated her and felt that due to her severe tricuspid regurgitation, she was not a good candidate for atrial fibrillation ablation, as well as the fact that she is not symptomatic.  He also recommended  discontinuing sotalol and pursuing rate control. She has since transitioned over to her warfarin checks with [facility] in [place].  Kathleen Mcconnell returns today for follow-up. She is noted to be in A. fib with mild rapid ventricular response today and rates in the low 100s. There is a question about her being on both ACE and ARB which was started over 10 years ago. Although this is not current practice I have continued on this medicine because he seems to be doing well, however it is somewhat redundant. She denies any chest pain or worsening shortness of breath. I do believe she could have better rate control.  I saw [name] back today in the office for follow-up. Overall she's feeling well. She remains rate controlled in permanent A. fib. Her INRs have been therapeutic except for one short period this summer. Apparently she was seen in the ER for what was thought to be a TIA event. Her INR was slightly low however cardio workup was negative. She ultimately was reestablished to a therapeutic level of warfarin and has done well since then.  07/13/2016  Kathleen Mcconnell returns today for follow-up. She seems to be doing fairly well. She's had no further TIA events. She is accompanied by her friend today who reports that she has noted that Kathleen Mcconnell has had more shortness of breath recently. Particularly when walking up stairs. She's also had lower extremity swelling and some discoloration of her ankles. She is concerned about heart failure. Blood pressure is mildly elevated today however she found her dog dead this morning. Understandably her  blood pressure would be elevated. She denies any chest pain.  PMHx:  Past Medical History:  Diagnosis Date  . History of nuclear stress test 03/2012   lexiscan; normal pattern of perfusion; normal, low risk   . Hyperlipidemia   . Hypertension   . Hypothyroidism   . Mild aortic insufficiency   . Mitral valve regurgitation   . Persistent atrial fibrillation ([place])     History of; cardioversion in 2004  . Severe tricuspid regurgitation    on echo 02/2011 ?rheumatic heart disease  . Thyroid mass 10/10/2001   benign lesion removed    Past Surgical History:  Procedure Laterality Date  . CARDIAC CATHETERIZATION  123456   normal systolic function, near normal coronaries   . CARDIOVERSION N/A 01/20/2013   Procedure: CARDIOVERSION;  Surgeon: Pixie Casino, MD;  Location: [place];  Service: Cardiovascular;  Laterality: N/A;  . THYROIDECTOMY  10/10/2001  . TRANSTHORACIC ECHOCARDIOGRAM  02/2011   EF=>55%; mild MR; mod-severe TR & elevated RV systolic pressure, mild pulm HTN; aortic valve mildly sclerotic with mild regurg    FAMHx:  Family History  Problem Relation Age of Onset  . Heart disease Father     also MI  . Heart disease Brother     x 3, one deceased age 40  . Heart disease Sister     SOCHx:   reports that she has never smoked. She has never used smokeless tobacco. She reports that she does not drink alcohol or use drugs.  ALLERGIES:  Allergies  Allergen Reactions  . Aspirin Anaphylaxis, Hives, Rash and Other (See Comments)    As well as short of breath; Previously was rushed to hospital-given epinephrine injection.     ROS: A comprehensive review of systems was negative.  HOME MEDS: Current Outpatient Prescriptions  Medication Sig Dispense Refill  . acetaminophen (TYLENOL) 500 MG tablet Take 1,000 mg by mouth every 6 (six) hours as needed. For pain    . B Complex Vitamins (B COMPLEX 1 PO) Take 1 tablet by mouth daily.    Marland Kitchen CALCIUM-VITAMIN D PO Take 1 tablet by mouth 2 (two) times daily.    Marland Kitchen diltiazem (CARDIZEM CD) 240 MG 24 hr capsule TAKE (1) CAPSULE BY MOUTH ONCE DAILY. 90 capsule 0  . fish oil-omega-3 fatty acids 1000 MG capsule Take 1 g by mouth daily.    Marland Kitchen levothyroxine (SYNTHROID, LEVOTHROID) 88 MCG tablet Take 88 mcg by mouth daily.    Marland Kitchen lisinopril (PRINIVIL,ZESTRIL) 20 MG tablet TAKE (1) TABLET BY MOUTH TWICE DAILY. 180  tablet 0  . metoprolol succinate (TOPROL-XL) 50 MG 24 hr tablet TAKE ONE TABLET BY MOUTH DAILY. 90 tablet 0  . Multiple Vitamin (MULTIVITAMIN WITH MINERALS) TABS Take 1 tablet by mouth daily.    . rosuvastatin (CRESTOR) 5 MG tablet Take 5 mg by mouth daily.  2  . tretinoin (RETIN-A) 0.05 % cream Apply 1 application topically daily.     Marland Kitchen warfarin (COUMADIN) 6 MG tablet Take 1/2 to 1 tablet by mouth once daily as directed by coumadin clinic 30 tablet 4   No current facility-administered medications for this visit.     LABS/IMAGING: Results for orders placed or performed in visit on 07/12/16 (from the past 48 hour(s))  POCT INR     Status: Normal   Collection Time: 07/12/16 10:23 AM  Result Value Ref Range   INR 2.3    No results found.  VITALS: BP (!) 144/86   Pulse 72  Ht 5\' 3"  (1.6 m)   Wt 116 lb (52.6 kg)   BMI 20.55 kg/m   EXAM: General appearance: alert and no distress Neck: no carotid bruit and no JVD Lungs: clear to auscultation bilaterally Heart: irregularly irregular rhythm Abdomen: soft, non-tender; bowel sounds normal; no masses,  no organomegaly Extremities: edema trace pedal edema and venous stasis dermatitis noted Pulses: 2+ and symmetric Skin: Skin color, texture, turgor normal. No rashes or lesions Neurologic: Grossly normal  EKG: Atrial fibrillation with CVR at 72, nonspecific ST changes  ASSESSMENT: 1. Progressive dyspnea on exertion 2. Lower extremity edema 3. Permanent atrial fibrillation-asymptomatic 4. Hypertension-controlled 5. Long-term anticoagulation on warfarin 6. Recent TIA  PLAN: 1.   Mrs. Matheny has had some progressive dyspnea on exertion which she downplayed but her friend reported was significant. She also has some lower extremity swelling. There was an echo last year which showed normal systolic function at the time she had TIA. I like to recheck that again today. I suspect this swelling is more related to venous insufficiency. There  are hemosiderin changes on both ankles. She benefit from bilateral knee-high 20-30 mmHg compression stockings. We'll plan to see her back to go over echo in about a month. At that time if there is continued swelling we could use low-dose diuretic.  Pixie Casino, MD, [facility] Attending Cardiologist [place] 07/13/2016, 2:48 PM

## 2016-07-13 NOTE — Patient Instructions (Signed)
Your physician has requested that you have an echocardiogram @ [facility] Echocardiography is a painless test that uses sound waves to create images of your heart. It provides your doctor with information about the size and shape of your heart and how well your heart's chambers and valves are working. This procedure takes approximately one hour. There are no restrictions for this procedure.  Dr. Debara Pickett has requested that you purchase compression stockings. A prescription has been provided  Your physician recommends that you schedule a follow-up appointment after your echocardiogram.

## 2016-08-23 ENCOUNTER — Ambulatory Visit (HOSPITAL_COMMUNITY)
Admission: RE | Admit: 2016-08-23 | Discharge: 2016-08-23 | Disposition: A | Discharge status: Home / Self Care | Payer: Medicare Other | Source: Ambulatory Visit | Attending: Internal Medicine | Admitting: Internal Medicine

## 2016-08-23 ENCOUNTER — Ambulatory Visit (INDEPENDENT_AMBULATORY_CARE_PROVIDER_SITE_OTHER): Payer: Medicare Other | Admitting: *Deleted

## 2016-08-23 DIAGNOSIS — I517 Cardiomegaly: Secondary | ICD-10-CM | POA: Diagnosis not present

## 2016-08-23 DIAGNOSIS — E785 Hyperlipidemia, unspecified: Secondary | ICD-10-CM | POA: Insufficient documentation

## 2016-08-23 DIAGNOSIS — I081 Rheumatic disorders of both mitral and tricuspid valves: Secondary | ICD-10-CM | POA: Diagnosis not present

## 2016-08-23 DIAGNOSIS — I351 Nonrheumatic aortic (valve) insufficiency: Secondary | ICD-10-CM | POA: Diagnosis not present

## 2016-08-23 DIAGNOSIS — I4891 Unspecified atrial fibrillation: Secondary | ICD-10-CM | POA: Diagnosis not present

## 2016-08-23 DIAGNOSIS — I10 Essential (primary) hypertension: Secondary | ICD-10-CM | POA: Diagnosis not present

## 2016-08-23 DIAGNOSIS — R0602 Shortness of breath: Secondary | ICD-10-CM | POA: Diagnosis not present

## 2016-08-23 DIAGNOSIS — Z7902 Long term (current) use of antithrombotics/antiplatelets: Secondary | ICD-10-CM | POA: Diagnosis not present

## 2016-08-23 DIAGNOSIS — R600 Localized edema: Secondary | ICD-10-CM

## 2016-08-23 DIAGNOSIS — Z8673 Personal history of transient ischemic attack (TIA), and cerebral infarction without residual deficits: Secondary | ICD-10-CM | POA: Insufficient documentation

## 2016-08-23 LAB — ECHOCARDIOGRAM COMPLETE
E decel time: 137 msec
E/e' ratio: 5.52
FS: 32 % (ref 28–44)
IVS/LV PW RATIO, ED: 1.01
LA ID, A-P, ES: 52 mm
LA diam end sys: 52 mm
LA diam index: 3.4 cm/m2
LA vol A4C: 101 ml
LA vol index: 57.2 mL/m2
LA vol: 87.5 mL
LV E/e' medial: 5.52
LV E/e'average: 5.52
LV PW d: 10.1 mm — AB (ref 0.6–1.1)
LV dias vol index: 29 mL/m2
LV dias vol: 44 mL — AB (ref 46–106)
LV e' LATERAL: 14.1 cm/s
LV sys vol index: 12 mL/m2
LV sys vol: 19 mL (ref 14–42)
LVOT SV: 30 mL
LVOT VTI: 13 cm
LVOT area: 2.27 cm2
LVOT diameter: 17 mm
LVOT peak grad rest: 2 mmHg
LVOT peak vel: 61.6 cm/s
Lateral S' vel: 9.68 cm/s
MV Dec: 137
MV Peak grad: 2 mmHg
MV pk E vel: 77.8 m/s
PISA EROA: 0.1 cm2
RV sys press: 28 mmHg
Reg peak vel: 249 cm/s
Simpson's disk: 57
Stroke v: 25 ml
TAPSE: 16.1 mm
TDI e' lateral: 14.1
TDI e' medial: 9.03
TR max vel: 249 cm/s
VTI: 166 cm

## 2016-08-23 LAB — POCT INR: INR: 2.5

## 2016-08-23 NOTE — Progress Notes (Signed)
*  PRELIMINARY RESULTS* Echocardiogram 2D Echocardiogram has been performed.  Samuel Germany 08/23/2016, 10:19 AM

## 2016-08-25 ENCOUNTER — Ambulatory Visit (INDEPENDENT_AMBULATORY_CARE_PROVIDER_SITE_OTHER): Payer: Medicare Other | Admitting: Internal Medicine

## 2016-08-25 ENCOUNTER — Ambulatory Visit (INDEPENDENT_AMBULATORY_CARE_PROVIDER_SITE_OTHER): Payer: Medicare Other | Admitting: Pharmacist

## 2016-08-25 ENCOUNTER — Encounter: Payer: Self-pay | Admitting: Internal Medicine

## 2016-08-25 VITALS — BP 120/70 | HR 71 | Ht 63.0 in | Wt 118.0 lb

## 2016-08-25 DIAGNOSIS — I4891 Unspecified atrial fibrillation: Secondary | ICD-10-CM

## 2016-08-25 DIAGNOSIS — Z7902 Long term (current) use of antithrombotics/antiplatelets: Secondary | ICD-10-CM

## 2016-08-25 DIAGNOSIS — I10 Essential (primary) hypertension: Secondary | ICD-10-CM | POA: Diagnosis not present

## 2016-08-25 DIAGNOSIS — I480 Paroxysmal atrial fibrillation: Secondary | ICD-10-CM

## 2016-08-25 DIAGNOSIS — R0602 Shortness of breath: Secondary | ICD-10-CM

## 2016-08-25 DIAGNOSIS — R600 Localized edema: Secondary | ICD-10-CM

## 2016-08-25 DIAGNOSIS — Z7901 Long term (current) use of anticoagulants: Secondary | ICD-10-CM

## 2016-08-25 LAB — POCT INR: INR: 2.2

## 2016-08-25 MED ORDER — APIXABAN 5 MG PO TABS: 5.0000 mg | ORAL_TABLET | Freq: Two times a day (BID) | ORAL | 0 refills | Status: DC

## 2016-08-25 MED ORDER — APIXABAN 5 MG PO TABS: 5.0000 mg | ORAL_TABLET | Freq: Two times a day (BID) | ORAL | 4 refills | Status: DC

## 2016-08-25 MED ORDER — FUROSEMIDE 20 MG PO TABS: 20.0000 mg | ORAL_TABLET | Freq: Every day | ORAL | 2 refills | Status: DC | PRN

## 2016-08-25 NOTE — Progress Notes (Signed)
Pt to change to Eliquis when INR <2.0  Reviewed patients medication list.  Pt is not currently on any combined P-gp and strong CYP3A4 inhibitors/inducers (ketoconazole, traconazole, ritonavir, carbamazepine, phenytoin, rifampin, St. John's wort).  Reviewed labs.  SCr 0.65, Weight 53.5kg, Age <80.  Dose appropriate based on age, weight, and SCr.    A full discussion of the nature of anticoagulants has been carried out.  A benefit/risk analysis has been presented to the patient, so that they understand the justification for choosing anticoagulation with Eliquis at this time.  The need for compliance is stressed.  Pt is aware to take the medication twice daily.  Side effects of potential bleeding are discussed, including unusual colored urine or stools, coughing up blood or coffee ground emesis, nose bleeds or serious fall or head trauma.  Discussed signs and symptoms of stroke. The patient should avoid any OTC items containing aspirin or ibuprofen.  Avoid alcohol consumption.   Call if any signs of abnormal bleeding.  Discussed financial obligations and resolved any difficulty in obtaining medication.

## 2016-08-25 NOTE — Patient Instructions (Addendum)
Medication Instructions:  START Eliquis --Going to Pharmacist pod to discuss and start Eliquis STOP Coumdin  Labwork: None Ordered  Testing/Procedures: None Ordered  Follow-Up: Your physician wants you to follow-up in: 6  MONTHS WITH DR HILTY. You will receive a reminder letter in the mail two months in advance. If you don't receive a letter, please call our office to schedule the follow-up appointment.  Any Other Special Instructions Will Be Listed Below (If Applicable).    If you need a refill on your cardiac medications before your next appointment, please call your pharmacy.

## 2016-08-25 NOTE — Patient Instructions (Signed)
Eliquis 

## 2016-08-28 NOTE — Progress Notes (Signed)
OFFICE NOTE  Chief Complaint: Swelling has improved  Primary Care Physician: Purvis Kilts, MD  HPI:  Kathleen Mcconnell is a 75 year old female with a history of paroxysmal atrial fibrillation, on sotalol, who had a cardioversion in 2004 and had not had recurrence until recently. She also has signs of rheumatic heart disease on her echocardiogram with some sclerosis of the aortic valve, and moderate to severe TR and mild MR with a flat coaptation on the mitral valve. In addition, her EF is greater than 55% and she has normal atrial sizes. Recently, she had presented to the office for an annual followup and was found to be in atrial fibrillation with controlled ventricular response. She was clearly unaware that she was in atrial fibrillation; however, she did note that she was getting a little more short of breath when walking up stairs and had been a little bit more fatigued recently. She has been maintained on warfarin and has done fairly well with maintaining a therapeutic INR. She is also on diltiazem additionally for rate control and also for hypertension. I, therefore, recommended increasing her sotalol to 120 mg twice daily and set her up for a cardioversion. She underwent cardioversion on November 19, 2012, with a single 150-joule biphasic shock. She converted to a sinus rhythm and afterwards felt very well. This persisted for several weeks until recently; she started to feel tired again, not nearly as good as shortly after the procedure was performed. On followup visit, she was noted again to be in atrial fibrillation, now with slow ventricular response at a rate of 56. She claimed to be fairly symptomatic and therefore refer her to see Dr. Rayann Heman with cardiac electrophysiology. He evaluated her and felt that due to her severe tricuspid regurgitation, she was not a good candidate for atrial fibrillation ablation, as well as the fact that she is not symptomatic.  He also recommended  discontinuing sotalol and pursuing rate control. She has since transitioned over to her warfarin checks with [facility] in [place].  Kathleen Mcconnell returns today for follow-up. She is noted to be in A. fib with mild rapid ventricular response today and rates in the low 100s. There is a question about her being on both ACE and ARB which was started over 10 years ago. Although this is not current practice I have continued on this medicine because he seems to be doing well, however it is somewhat redundant. She denies any chest pain or worsening shortness of breath. I do believe she could have better rate control.  I saw Kathleen Mcconnell back today in the office for follow-up. Overall she's feeling well. She remains rate controlled in permanent A. fib. Her INRs have been therapeutic except for one short period this summer. Apparently she was seen in the ER for what was thought to be a TIA event. Her INR was slightly low however cardio workup was negative. She ultimately was reestablished to a therapeutic level of warfarin and has done well since then.  07/13/2016  Kathleen Mcconnell returns today for follow-up. She seems to be doing fairly well. She's had no further TIA events. She is accompanied by her friend today who reports that she has noted that Kathleen Mcconnell has had more shortness of breath recently. Particularly when walking up stairs. She's also had lower extremity swelling and some discoloration of her ankles. She is concerned about heart failure. Blood pressure is mildly elevated today however she found her dog dead this morning. Understandably her blood  pressure would be elevated. She denies any chest pain.  [id number]  Kathleen Mcconnell returns today for follow-up. She reports her swelling has significantly improved, however, she never got her compression stockings. Echo shows EF 55-60% with severe biatrial enlargement (maybe consistent with permanent a-fib) and mild to moderate MR and moderate TR with RVSP of 33 mmHg.  Although she has been compliant with warfarin and INR has been fairly well managed, it is likely that she will have greater benefit and lower bleeding risk on DOAC such as Eliquis. We discussed switching today. She was previously on Xarelto before warfarin, however, thought she may have had side-effects. Turned out not to be the medication.  PMHx:  Past Medical History:  Diagnosis Date  . History of nuclear stress test 03/2012   lexiscan; normal pattern of perfusion; normal, low risk   . Hyperlipidemia   . Hypertension   . Hypothyroidism   . Mild aortic insufficiency   . Mitral valve regurgitation   . Persistent atrial fibrillation ([place])    History of; cardioversion in 2004  . Severe tricuspid regurgitation    on echo 02/2011 ?rheumatic heart disease  . Thyroid mass 10/10/2001   benign lesion removed    Past Surgical History:  Procedure Laterality Date  . CARDIAC CATHETERIZATION  123456   normal systolic function, near normal coronaries   . CARDIOVERSION N/A 01/20/2013   Procedure: CARDIOVERSION;  Surgeon: Pixie Casino, MD;  Location: [facility];  Service: Cardiovascular;  Laterality: N/A;  . THYROIDECTOMY  10/10/2001  . TRANSTHORACIC ECHOCARDIOGRAM  02/2011   EF=>55%; mild MR; mod-severe TR & elevated RV systolic pressure, mild pulm HTN; aortic valve mildly sclerotic with mild regurg    FAMHx:  Family History  Problem Relation Age of Onset  . Heart disease Father     also MI  . Heart disease Brother     x 3, one deceased age 28  . Heart disease Sister     SOCHx:   reports that she has never smoked. She has never used smokeless tobacco. She reports that she does not drink alcohol or use drugs.  ALLERGIES:  Allergies  Allergen Reactions  . Aspirin Anaphylaxis, Hives, Rash and Other (See Comments)    As well as short of breath; Previously was rushed to hospital-given epinephrine injection.     ROS: A comprehensive review of systems was negative.  HOME MEDS: Current  Outpatient Prescriptions  Medication Sig Dispense Refill  . acetaminophen (TYLENOL) 500 MG tablet Take 1,000 mg by mouth every 6 (six) hours as needed. For pain    . B Complex Vitamins (B COMPLEX 1 PO) Take 1 tablet by mouth daily.    Marland Kitchen CALCIUM-VITAMIN D PO Take 1 tablet by mouth 2 (two) times daily.    Marland Kitchen diltiazem (CARDIZEM CD) 240 MG 24 hr capsule TAKE (1) CAPSULE BY MOUTH ONCE DAILY. 90 capsule 0  . fish oil-omega-3 fatty acids 1000 MG capsule Take 1 g by mouth daily.    Marland Kitchen levothyroxine (SYNTHROID, LEVOTHROID) 88 MCG tablet Take 88 mcg by mouth daily.    Marland Kitchen lisinopril (PRINIVIL,ZESTRIL) 20 MG tablet TAKE (1) TABLET BY MOUTH TWICE DAILY. 180 tablet 0  . metoprolol succinate (TOPROL-XL) 50 MG 24 hr tablet TAKE ONE TABLET BY MOUTH DAILY. 90 tablet 0  . Multiple Vitamin (MULTIVITAMIN WITH MINERALS) TABS Take 1 tablet by mouth daily.    . rosuvastatin (CRESTOR) 5 MG tablet Take 5 mg by mouth daily.  2  . tretinoin (RETIN-A) 0.05 %  cream Apply 1 application topically daily.     Marland Kitchen apixaban (ELIQUIS) 5 MG TABS tablet Take 1 tablet (5 mg total) by mouth 2 (two) times daily. 60 tablet 4  . furosemide (LASIX) 20 MG tablet Take 1 tablet (20 mg total) by mouth daily as needed. 30 tablet 2   No current facility-administered medications for this visit.     LABS/IMAGING: No results found for this or any previous visit (from the past 48 hour(s)). No results found.  VITALS: BP 120/70 (BP Location: Right Arm, Patient Position: Sitting, Cuff Size: Normal)   Pulse 71   Ht 5\' 3"  (1.6 m)   Wt 118 lb (53.5 kg)   SpO2 97%   BMI 20.90 kg/m   EXAM: General appearance: alert and no distress Lungs: clear to auscultation bilaterally Heart: irregularly irregular rhythm Extremities: edema trace pedal edema and venous stasis dermatitis noted Neurologic: Grossly normal  EKG: Deferred  ASSESSMENT: 1. DOE-  Echo with normal LV function, severe biatrial enlargement - mild to moderate MR, moderate  TR 2. Lower extremity edema 3. Permanent atrial fibrillation-asymptomatic 4. Hypertension-controlled 5. Long-term anticoagulation on warfarin 6. Recent TIA  PLAN: 1.   Kathleen Mcconnell has had improvement in swelling and may benefit from compression stockings. Her dyspnea is now "tolerable". She has permanent AF and is at higher risk for stroke. We can lower risk further with DOAC instead of warfarin. She is agreeable to trying Eliquis. D/w Tana Coast, PharmD, [place], in pharmacy and she will provide transition plan. Plan follow-up in 6 months.  Pixie Casino, MD, [facility] Attending Cardiologist [place] 08/28/2016, 8:59 PM

## 2016-09-02 ENCOUNTER — Telehealth: Payer: Self-pay | Admitting: Physician Assistant

## 2016-09-02 NOTE — Telephone Encounter (Signed)
    Patient recently started on Eliquis and accidentally took two this AM. I discussed with her daughter Lynelle Smoke. No s/s bleeding. Doing well but pt very upset and worried about the mistake. I reassured her daughter that she will be fine and to go ahead and take her evening dose as normally prescribed. They will separate the eliquis from her other medications going forward since it looks very similar to another medication she takes which is what lead to this confusion. They will call us back if anything changes.    Angelena Form PA-C  [facility]

## 2016-09-19 ENCOUNTER — Encounter: Payer: Self-pay | Admitting: Internal Medicine

## 2016-09-26 DIAGNOSIS — Z23 Encounter for immunization: Secondary | ICD-10-CM | POA: Diagnosis not present

## 2016-10-30 ENCOUNTER — Telehealth: Payer: Self-pay | Admitting: Pharmacist Clinician (PhC)/ Clinical Pharmacy Specialist

## 2016-10-30 MED ORDER — WARFARIN SODIUM 6 MG PO TABS: 6.0000 mg | ORAL_TABLET | Freq: Every day | ORAL | 3 refills | Status: DC

## 2016-10-30 NOTE — Telephone Encounter (Signed)
Spoke with daughter Lynelle Smoke, patient has had problems with nausea and weight loss since starting Eliquis.  Has taken for 2 months with no change in symptoms.  Would like to go back on warfarin.    Will start her at previous warfarin dose, 3 mg each MWF, 6 mg all other days.  Explained need to overlap with Eliquis x 3 days.   Patient will start on Tuesday Nov 28, then see Lattie Haw for INR check on Monday Dec 4.  Daughter voiced understanding of information.

## 2016-11-06 ENCOUNTER — Ambulatory Visit (INDEPENDENT_AMBULATORY_CARE_PROVIDER_SITE_OTHER): Payer: Medicare Other | Admitting: *Deleted

## 2016-11-06 DIAGNOSIS — Z5181 Encounter for therapeutic drug level monitoring: Secondary | ICD-10-CM | POA: Diagnosis not present

## 2016-11-06 DIAGNOSIS — Z7902 Long term (current) use of antithrombotics/antiplatelets: Secondary | ICD-10-CM | POA: Diagnosis not present

## 2016-11-06 DIAGNOSIS — I4891 Unspecified atrial fibrillation: Secondary | ICD-10-CM

## 2016-11-06 LAB — POCT INR: INR: 2.3

## 2016-11-20 ENCOUNTER — Ambulatory Visit (INDEPENDENT_AMBULATORY_CARE_PROVIDER_SITE_OTHER): Payer: Medicare Other | Admitting: *Deleted

## 2016-11-20 DIAGNOSIS — Z5181 Encounter for therapeutic drug level monitoring: Secondary | ICD-10-CM

## 2016-11-20 DIAGNOSIS — I4891 Unspecified atrial fibrillation: Secondary | ICD-10-CM | POA: Diagnosis not present

## 2016-11-20 DIAGNOSIS — Z7902 Long term (current) use of antithrombotics/antiplatelets: Secondary | ICD-10-CM

## 2016-11-20 LAB — POCT INR: INR: 3.2

## 2016-12-06 DIAGNOSIS — S80812A Abrasion, left lower leg, initial encounter: Secondary | ICD-10-CM | POA: Diagnosis not present

## 2016-12-06 DIAGNOSIS — Z6821 Body mass index (BMI) 21.0-21.9, adult: Secondary | ICD-10-CM | POA: Diagnosis not present

## 2016-12-11 ENCOUNTER — Ambulatory Visit (INDEPENDENT_AMBULATORY_CARE_PROVIDER_SITE_OTHER): Payer: Medicare Other | Admitting: *Deleted

## 2016-12-11 DIAGNOSIS — I4891 Unspecified atrial fibrillation: Secondary | ICD-10-CM

## 2016-12-11 DIAGNOSIS — Z7902 Long term (current) use of antithrombotics/antiplatelets: Secondary | ICD-10-CM | POA: Diagnosis not present

## 2016-12-11 DIAGNOSIS — Z5181 Encounter for therapeutic drug level monitoring: Secondary | ICD-10-CM

## 2016-12-11 LAB — POCT INR: INR: 1.8

## 2016-12-18 DIAGNOSIS — R350 Frequency of micturition: Secondary | ICD-10-CM | POA: Diagnosis not present

## 2016-12-18 DIAGNOSIS — N390 Urinary tract infection, site not specified: Secondary | ICD-10-CM | POA: Diagnosis not present

## 2016-12-18 DIAGNOSIS — Z6821 Body mass index (BMI) 21.0-21.9, adult: Secondary | ICD-10-CM | POA: Diagnosis not present

## 2016-12-18 DIAGNOSIS — N342 Other urethritis: Secondary | ICD-10-CM | POA: Diagnosis not present

## 2016-12-18 DIAGNOSIS — Z1389 Encounter for screening for other disorder: Secondary | ICD-10-CM | POA: Diagnosis not present

## 2016-12-23 ENCOUNTER — Other Ambulatory Visit: Payer: Self-pay | Admitting: Internal Medicine

## 2016-12-25 NOTE — Telephone Encounter (Signed)
Rx(s) sent to pharmacy electronically.  

## 2017-01-01 ENCOUNTER — Emergency Department (HOSPITAL_COMMUNITY)
Admission: EM | Admit: 2017-01-01 | Discharge: 2017-01-01 | Disposition: A | Discharge status: Home / Self Care | Payer: Medicare Other | Attending: Emergency Medicine | Admitting: Emergency Medicine

## 2017-01-01 ENCOUNTER — Ambulatory Visit (INDEPENDENT_AMBULATORY_CARE_PROVIDER_SITE_OTHER): Payer: Medicare Other | Admitting: *Deleted

## 2017-01-01 ENCOUNTER — Encounter (HOSPITAL_COMMUNITY): Payer: Self-pay | Admitting: Emergency Medicine

## 2017-01-01 DIAGNOSIS — Z7902 Long term (current) use of antithrombotics/antiplatelets: Secondary | ICD-10-CM | POA: Diagnosis not present

## 2017-01-01 DIAGNOSIS — I10 Essential (primary) hypertension: Secondary | ICD-10-CM | POA: Insufficient documentation

## 2017-01-01 DIAGNOSIS — Z7901 Long term (current) use of anticoagulants: Secondary | ICD-10-CM | POA: Insufficient documentation

## 2017-01-01 DIAGNOSIS — Z79899 Other long term (current) drug therapy: Secondary | ICD-10-CM | POA: Diagnosis not present

## 2017-01-01 DIAGNOSIS — E039 Hypothyroidism, unspecified: Secondary | ICD-10-CM | POA: Diagnosis not present

## 2017-01-01 DIAGNOSIS — Z5181 Encounter for therapeutic drug level monitoring: Secondary | ICD-10-CM | POA: Diagnosis not present

## 2017-01-01 DIAGNOSIS — I4891 Unspecified atrial fibrillation: Secondary | ICD-10-CM | POA: Diagnosis not present

## 2017-01-01 DIAGNOSIS — R791 Abnormal coagulation profile: Secondary | ICD-10-CM | POA: Diagnosis not present

## 2017-01-01 DIAGNOSIS — N390 Urinary tract infection, site not specified: Secondary | ICD-10-CM | POA: Diagnosis not present

## 2017-01-01 DIAGNOSIS — R319 Hematuria, unspecified: Secondary | ICD-10-CM | POA: Diagnosis not present

## 2017-01-01 LAB — CBC
Hematocrit: 37.9 % (ref 34.0–46.6)
Hemoglobin: 13.4 g/dL (ref 11.1–15.9)
MCH: 31.8 pg (ref 26.6–33.0)
MCHC: 35.4 g/dL (ref 31.5–35.7)
MCV: 90 fL (ref 79–97)
Platelets: 213 10*3/uL (ref 150–379)
RBC: 4.21 x10E6/uL (ref 3.77–5.28)
RDW: 13.1 % (ref 12.3–15.4)
WBC: 6.5 10*3/uL (ref 3.4–10.8)

## 2017-01-01 LAB — POCT INR: INR: >8

## 2017-01-01 LAB — PROTIME-INR
INR: 14.9 (ref 0.8–1.2)
Prothrombin Time: 138.8 s — ABNORMAL HIGH (ref 9.1–12.0)

## 2017-01-01 MED ORDER — PHYTONADIONE 5 MG PO TABS
5.0000 mg | ORAL_TABLET | Freq: Once | ORAL | Status: AC
  Administered 2017-01-01: 5 mg via ORAL
  Filled 2017-01-01: qty 1

## 2017-01-01 NOTE — Discharge Instructions (Signed)
Do not take your coumadin until cleared by your cardiologist.

## 2017-01-01 NOTE — ED Provider Notes (Signed)
[facility] DEPT Provider Note   CSN: [id number] Arrival date & time: 01/01/17  1514     History   Chief Complaint Chief Complaint  Patient presents with  . Abnormal Lab    INR 123456    HPI Kathleen Mcconnell is a 76 y.o. female.  Pt presents to the ED today with INR of 14.9.  Pt takes coumadin for a.fib (CHADVASC 4).  The pt recently had an UTI and took Bactrim.  She had her INR checked today at her cardiologist's office and it was elevated to 14.9.  They told her to come in for vitamin k.  Pt does not have any bleeding and otherwise feels fine.      Past Medical History:  Diagnosis Date  . History of nuclear stress test 03/2012   lexiscan; normal pattern of perfusion; normal, low risk   . Hyperlipidemia   . Hypertension   . Hypothyroidism   . Mild aortic insufficiency   . Mitral valve regurgitation   . Persistent atrial fibrillation ([place])    History of; cardioversion in 2004  . Severe tricuspid regurgitation    on echo 02/2011 ?rheumatic heart disease  . Thyroid mass 10/10/2001   benign lesion removed    Patient Active Problem List   Diagnosis Date Noted  . Encounter for therapeutic drug monitoring 11/06/2016  . Bilateral lower extremity edema 07/13/2016  . Shortness of breath 07/13/2016  . TIA (transient ischemic attack) 03/15/2015  . Hypothyroidism   . Hyperlipidemia   . Other specified hypothyroidism   . Encounter for long-term (current) use of antiplatelets/antithrombotics 07/02/2014  . Essential hypertension 02/27/2013  . Tricuspid regurgitation 02/27/2013  . Atrial fibrillation ([place]) 02/07/2013  . Long term current use of anticoagulant therapy 02/07/2013  . Special screening for malignant neoplasms, colon 12/20/2012    Past Surgical History:  Procedure Laterality Date  . CARDIAC CATHETERIZATION  123456   normal systolic function, near normal coronaries   . CARDIOVERSION N/A 01/20/2013   Procedure: CARDIOVERSION;  Surgeon: Pixie Casino, MD;   Location: [facility];  Service: Cardiovascular;  Laterality: N/A;  . THYROIDECTOMY  10/10/2001  . TRANSTHORACIC ECHOCARDIOGRAM  02/2011   EF=>55%; mild MR; mod-severe TR & elevated RV systolic pressure, mild pulm HTN; aortic valve mildly sclerotic with mild regurg    OB History    No data available       Home Medications    Prior to Admission medications   Medication Sig Start Date End Date Taking? Authorizing Provider  acetaminophen (TYLENOL) 500 MG tablet Take 1,000 mg by mouth every 6 (six) hours as needed. For pain    Historical Provider, MD  B Complex Vitamins (B COMPLEX 1 PO) Take 1 tablet by mouth daily.    Historical Provider, MD  CALCIUM-VITAMIN D PO Take 1 tablet by mouth 2 (two) times daily.    Historical Provider, MD  diltiazem (CARDIZEM CD) 240 MG 24 hr capsule TAKE (1) CAPSULE BY MOUTH ONCE DAILY. 12/25/16   Pixie Casino, MD  fish oil-omega-3 fatty acids 1000 MG capsule Take 1 g by mouth daily.    Historical Provider, MD  furosemide (LASIX) 20 MG tablet Take 1 tablet (20 mg total) by mouth daily as needed. 08/25/16   Pixie Casino, MD  levothyroxine (SYNTHROID, LEVOTHROID) 88 MCG tablet Take 88 mcg by mouth daily.    Historical Provider, MD  lisinopril (PRINIVIL,ZESTRIL) 20 MG tablet TAKE (1) TABLET BY MOUTH TWICE DAILY. 12/25/16   Pixie Casino, MD  metoprolol succinate (TOPROL-XL) 50 MG 24 hr tablet TAKE ONE TABLET BY MOUTH DAILY. 12/25/16   Pixie Casino, MD  Multiple Vitamin (MULTIVITAMIN WITH MINERALS) TABS Take 1 tablet by mouth daily.    Historical Provider, MD  rosuvastatin (CRESTOR) 5 MG tablet Take 5 mg by mouth daily. 06/12/16   Historical Provider, MD  tretinoin (RETIN-A) 0.05 % cream Apply 1 application topically daily.  09/30/14   Historical Provider, MD  warfarin (COUMADIN) 6 MG tablet Take 1 tablet (6 mg total) by mouth daily. 10/30/16   Pixie Casino, MD    Family History Family History  Problem Relation Age of Onset  . Heart disease Father      also MI  . Heart disease Brother     x 3, one deceased age 2  . Heart disease Sister     Social History Social History  Substance Use Topics  . Smoking status: Never Smoker  . Smokeless tobacco: Never Used  . Alcohol use No     Allergies   Aspirin   Review of Systems Review of Systems  All other systems reviewed and are negative.    Physical Exam Updated Vital Signs BP 127/69 (BP Location: Right Arm)   Pulse 82   Temp 97.5 F (36.4 C) (Oral)   Resp 18   Ht 5\' 4"  (1.626 m)   Wt 115 lb (52.2 kg)   SpO2 98%   BMI 19.74 kg/m   Physical Exam  Constitutional: She is oriented to person, place, and time. She appears well-developed and well-nourished.  HENT:  Head: Normocephalic and atraumatic.  Right Ear: External ear normal.  Left Ear: External ear normal.  Nose: Nose normal.  Mouth/Throat: Oropharynx is clear and moist.  Eyes: Conjunctivae and EOM are normal. Pupils are equal, round, and reactive to light.  Neck: Normal range of motion. Neck supple.  Cardiovascular: Normal rate, regular rhythm, normal heart sounds and intact distal pulses.   Pulmonary/Chest: Effort normal and breath sounds normal.  Abdominal: Soft. Bowel sounds are normal.  Musculoskeletal: Normal range of motion.  Neurological: She is alert and oriented to person, place, and time.  Skin: Skin is warm and dry.  Psychiatric: She has a normal mood and affect. Her behavior is normal. Judgment and thought content normal.  Nursing note and vitals reviewed.    ED Treatments / Results  Labs (all labs ordered are listed, but only abnormal results are displayed) Labs Reviewed - No data to display  EKG  EKG Interpretation None       Radiology No results found.  Procedures Procedures (including critical care time)  Medications Ordered in ED Medications  phytonadione (VITAMIN K) tablet 5 mg (not administered)     Initial Impression / Assessment and Plan / ED Course  I have reviewed  the triage vital signs and the nursing notes.  Pertinent labs & imaging results that were available during my care of the patient were reviewed by me and considered in my medical decision making (see chart for details).    According to the ACCP guidelines, an INR above 10 with no bleeding can be given oral vitamin k .  The pt has close follow up with her cardiologist and knows to hold her coumadin until she is cleared by cardiology.  Final Clinical Impressions(s) / ED Diagnoses   Final diagnoses:  Elevated INR    New Prescriptions New Prescriptions   No medications on file     Isla Pence, MD 01/01/17 1645

## 2017-01-01 NOTE — ED Triage Notes (Signed)
PT was sent to ED by Coumadin clinic due to lab result done today of INR 14.9 but Hgb 13.4. PT denies any s/s of bleeding and states has been on bactrim recently and finished it on 12/31/16.

## 2017-01-03 ENCOUNTER — Ambulatory Visit (INDEPENDENT_AMBULATORY_CARE_PROVIDER_SITE_OTHER): Payer: Medicare Other | Admitting: *Deleted

## 2017-01-03 DIAGNOSIS — I4891 Unspecified atrial fibrillation: Secondary | ICD-10-CM

## 2017-01-03 DIAGNOSIS — Z7902 Long term (current) use of antithrombotics/antiplatelets: Secondary | ICD-10-CM | POA: Diagnosis not present

## 2017-01-03 DIAGNOSIS — Z5181 Encounter for therapeutic drug level monitoring: Secondary | ICD-10-CM | POA: Diagnosis not present

## 2017-01-03 LAB — POCT INR: INR: 2.1

## 2017-01-15 DIAGNOSIS — Z1231 Encounter for screening mammogram for malignant neoplasm of breast: Secondary | ICD-10-CM | POA: Diagnosis not present

## 2017-01-16 ENCOUNTER — Other Ambulatory Visit: Payer: Self-pay | Admitting: Internal Medicine

## 2017-01-17 ENCOUNTER — Ambulatory Visit (INDEPENDENT_AMBULATORY_CARE_PROVIDER_SITE_OTHER): Payer: Medicare Other | Admitting: *Deleted

## 2017-01-17 DIAGNOSIS — Z5181 Encounter for therapeutic drug level monitoring: Secondary | ICD-10-CM

## 2017-01-17 DIAGNOSIS — I4891 Unspecified atrial fibrillation: Secondary | ICD-10-CM

## 2017-01-17 DIAGNOSIS — Z7902 Long term (current) use of antithrombotics/antiplatelets: Secondary | ICD-10-CM | POA: Diagnosis not present

## 2017-01-17 LAB — POCT INR: INR: 2.1

## 2017-02-14 ENCOUNTER — Ambulatory Visit (INDEPENDENT_AMBULATORY_CARE_PROVIDER_SITE_OTHER): Payer: Medicare Other | Admitting: *Deleted

## 2017-02-14 DIAGNOSIS — I4891 Unspecified atrial fibrillation: Secondary | ICD-10-CM

## 2017-02-14 DIAGNOSIS — Z5181 Encounter for therapeutic drug level monitoring: Secondary | ICD-10-CM

## 2017-02-14 DIAGNOSIS — Z7902 Long term (current) use of antithrombotics/antiplatelets: Secondary | ICD-10-CM | POA: Diagnosis not present

## 2017-02-14 LAB — POCT INR: INR: 3.2

## 2017-02-19 DIAGNOSIS — Z1389 Encounter for screening for other disorder: Secondary | ICD-10-CM | POA: Diagnosis not present

## 2017-02-19 DIAGNOSIS — Z0000 Encounter for general adult medical examination without abnormal findings: Secondary | ICD-10-CM | POA: Diagnosis not present

## 2017-02-19 DIAGNOSIS — Z6821 Body mass index (BMI) 21.0-21.9, adult: Secondary | ICD-10-CM | POA: Diagnosis not present

## 2017-02-22 ENCOUNTER — Ambulatory Visit (INDEPENDENT_AMBULATORY_CARE_PROVIDER_SITE_OTHER): Payer: Medicare Other | Admitting: Internal Medicine

## 2017-02-22 VITALS — BP 124/76 | HR 74 | Ht 64.0 in | Wt 124.0 lb

## 2017-02-22 DIAGNOSIS — I4891 Unspecified atrial fibrillation: Secondary | ICD-10-CM | POA: Diagnosis not present

## 2017-02-22 DIAGNOSIS — R0602 Shortness of breath: Secondary | ICD-10-CM

## 2017-02-22 DIAGNOSIS — I10 Essential (primary) hypertension: Secondary | ICD-10-CM

## 2017-02-22 DIAGNOSIS — E782 Mixed hyperlipidemia: Secondary | ICD-10-CM | POA: Diagnosis not present

## 2017-02-22 NOTE — Patient Instructions (Signed)
Your physician wants you to follow-up in: 6 months with PA or NP and 12 months with Dr. Debara Pickett. You will receive a reminder letter in the mail two months in advance. If you don't receive a letter, please call our office to schedule the follow-up appointment.

## 2017-02-22 NOTE — Progress Notes (Signed)
OFFICE NOTE  Chief Complaint: Mild dyspnea  Primary Care Physician: Purvis Kilts, MD  HPI:  Kathleen Mcconnell is a 76 year old female with a history of paroxysmal atrial fibrillation, on sotalol, who had a cardioversion in 2004 and had not had recurrence until recently. She also has signs of rheumatic heart disease on her echocardiogram with some sclerosis of the aortic valve, and moderate to severe TR and mild MR with a flat coaptation on the mitral valve. In addition, her EF is greater than 55% and she has normal atrial sizes. Recently, she had presented to the office for an annual followup and was found to be in atrial fibrillation with controlled ventricular response. She was clearly unaware that she was in atrial fibrillation; however, she did note that she was getting a little more short of breath when walking up stairs and had been a little bit more fatigued recently. She has been maintained on warfarin and has done fairly well with maintaining a therapeutic INR. She is also on diltiazem additionally for rate control and also for hypertension. I, therefore, recommended increasing her sotalol to 120 mg twice daily and set her up for a cardioversion. She underwent cardioversion on November 19, 2012, with a single 150-joule biphasic shock. She converted to a sinus rhythm and afterwards felt very well. This persisted for several weeks until recently; she started to feel tired again, not nearly as good as shortly after the procedure was performed. On followup visit, she was noted again to be in atrial fibrillation, now with slow ventricular response at a rate of 56. She claimed to be fairly symptomatic and therefore refer her to see Dr. Rayann Heman with cardiac electrophysiology. He evaluated her and felt that due to her severe tricuspid regurgitation, she was not a good candidate for atrial fibrillation ablation, as well as the fact that she is not symptomatic.  He also recommended discontinuing  sotalol and pursuing rate control. She has since transitioned over to her warfarin checks with [facility] in [place].  Kathleen Mcconnell returns today for follow-up. She is noted to be in A. fib with mild rapid ventricular response today and rates in the low 100s. There is a question about her being on both ACE and ARB which was started over 10 years ago. Although this is not current practice I have continued on this medicine because he seems to be doing well, however it is somewhat redundant. She denies any chest pain or worsening shortness of breath. I do believe she could have better rate control.  I saw Kathleen Mcconnell back today in the office for follow-up. Overall she's feeling well. She remains rate controlled in permanent A. fib. Her INRs have been therapeutic except for one short period this summer. Apparently she was seen in the ER for what was thought to be a TIA event. Her INR was slightly low however cardio workup was negative. She ultimately was reestablished to a therapeutic level of warfarin and has done well since then.  07/13/2016  Kathleen Mcconnell returns today for follow-up. She seems to be doing fairly well. She's had no further TIA events. She is accompanied by her friend today who reports that she has noted that Kathleen Mcconnell has had more shortness of breath recently. Particularly when walking up stairs. She's also had lower extremity swelling and some discoloration of her ankles. She is concerned about heart failure. Blood pressure is mildly elevated today however she found her dog dead this morning. Understandably her blood pressure  would be elevated. She denies any chest pain.  03/01/9241  Kathleen Mcconnell returns today for follow-up. She reports her swelling has significantly improved, however, she never got her compression stockings. Echo shows EF 55-60% with severe biatrial enlargement (maybe consistent with permanent a-fib) and mild to moderate MR and moderate TR with RVSP of 33 mmHg. Although she has been  compliant with warfarin and INR has been fairly well managed, it is likely that she will have greater benefit and lower bleeding risk on DOAC such as Eliquis. We discussed switching today. She was previously on Xarelto before warfarin, however, thought she may have had side-effects. Turned out not to be the medication.  6/83/4196  Kathleen Mcconnell seen today in follow-up. Overall she's had improvement in swelling. She's taking her Lasix only about 4 days a week. She reported that she felt dizzy or unwell on Eliquis. Is not quite clear but she took it for 3 months and then transition back to warfarin. Unfortunately in January this year she had a UTI and was placed on Bactrim which significantly elevated her INR to almost 15. Subsequent to that she was referred to the hospital but did not have any significant or life-threatening bleeding. INR since been fairly well-regulated and it was 3.2 on March 14. Blood pressure is well-controlled today 124/76. She reports persistent shortness of breath which comes and goes when walking up stairs, something she does on a daily basis.  PMHx:  Past Medical History:  Diagnosis Date  . History of nuclear stress test 03/2012   lexiscan; normal pattern of perfusion; normal, low risk   . Hyperlipidemia   . Hypertension   . Hypothyroidism   . Mild aortic insufficiency   . Mitral valve regurgitation   . Persistent atrial fibrillation ([place])    History of; cardioversion in 2004  . Severe tricuspid regurgitation    on echo 02/2011 ?rheumatic heart disease  . Thyroid mass 10/10/2001   benign lesion removed    Past Surgical History:  Procedure Laterality Date  . CARDIAC CATHETERIZATION  2229   normal systolic function, near normal coronaries   . CARDIOVERSION N/A 01/20/2013   Procedure: CARDIOVERSION;  Surgeon: Pixie Casino, MD;  Location: [facility];  Service: Cardiovascular;  Laterality: N/A;  . THYROIDECTOMY  10/10/2001  . TRANSTHORACIC ECHOCARDIOGRAM  02/2011    EF=>55%; mild MR; mod-severe TR & elevated RV systolic pressure, mild pulm HTN; aortic valve mildly sclerotic with mild regurg    FAMHx:  Family History  Problem Relation Age of Onset  . Heart disease Father     also MI  . Heart disease Brother     x 3, one deceased age 43  . Heart disease Sister     SOCHx:   reports that she has never smoked. She has never used smokeless tobacco. She reports that she does not drink alcohol or use drugs.  ALLERGIES:  Allergies  Allergen Reactions  . Aspirin Anaphylaxis, Hives, Rash and Other (See Comments)    As well as short of breath; Previously was rushed to hospital-given epinephrine injection.     ROS: A comprehensive review of systems was negative.  HOME MEDS: Current Outpatient Prescriptions  Medication Sig Dispense Refill  . acetaminophen (TYLENOL) 500 MG tablet Take 1,000 mg by mouth every 6 (six) hours as needed. For pain    . B Complex Vitamins (B COMPLEX 1 PO) Take 1 tablet by mouth daily.    Marland Kitchen CALCIUM-VITAMIN D PO Take 1 tablet by mouth 2 (two) times  daily.    . diltiazem (CARDIZEM CD) 240 MG 24 hr capsule TAKE (1) CAPSULE BY MOUTH ONCE DAILY. 90 capsule 0  . fish oil-omega-3 fatty acids 1000 MG capsule Take 1 g by mouth daily.    . furosemide (LASIX) 20 MG tablet TAKE ONE TABLET DAILY AS NEEDED. 30 tablet 0  . levothyroxine (SYNTHROID, LEVOTHROID) 88 MCG tablet Take 88 mcg by mouth daily.    Marland Kitchen lisinopril (PRINIVIL,ZESTRIL) 20 MG tablet TAKE (1) TABLET BY MOUTH TWICE DAILY. 180 tablet 0  . metoprolol succinate (TOPROL-XL) 50 MG 24 hr tablet TAKE ONE TABLET BY MOUTH DAILY. 90 tablet 0  . Multiple Vitamin (MULTIVITAMIN WITH MINERALS) TABS Take 1 tablet by mouth daily.    . rosuvastatin (CRESTOR) 5 MG tablet Take 5 mg by mouth daily.  2  . tretinoin (RETIN-A) 0.05 % cream Apply 1 application topically daily.     Marland Kitchen warfarin (COUMADIN) 6 MG tablet Take 1 tablet (6 mg total) by mouth daily. 30 tablet 3   No current  facility-administered medications for this visit.     LABS/IMAGING: No results found for this or any previous visit (from the past 48 hour(s)). No results found.  VITALS: BP 124/76   Pulse 74   Ht 5\' 4"  (1.626 m)   Wt 124 lb (56.2 kg)   BMI 21.28 kg/m   EXAM: General appearance: alert and no distress Lungs: clear to auscultation bilaterally Heart: irregularly irregular rhythm Extremities: edema trace pedal edema and venous stasis dermatitis noted Neurologic: Grossly normal  EKG: Atrial fibrillation at 74  ASSESSMENT: 1. DOE-  Echo with normal LV function, severe biatrial enlargement - mild to moderate MR, moderate TR 2. Lower extremity edema 3. Permanent atrial fibrillation-asymptomatic 4. Hypertension-controlled 5. Long-term anticoagulation on warfarin 6. Recent TIA  PLAN: 1.   Kathleen Mcconnell still gets some rest of breath with exertion however it seems to be intermittent. Sometimes walking stairs and other times she can go up stairs without any difficulty. She takes Lasix probably 3 or 4 times a week but is restricted due to not need go to the bathroom. Her weight actually is continuing to climb. It's now up about 6 pounds since 2017. I've encouraged her to try to take her Lasix on a daily basis. INR seems regulated and since the problem with supratherapeutic INR was probably due to drug/drug interaction, we'll continue with warfarin per her preference. She did not feel well on Eliquis.  Follow-up with an advanced practice provider in 6 months per family request and then me in one year.  Pixie Casino, MD, [facility] Attending Cardiologist [place] C Hilty 02/22/2017, 2:47 PM

## 2017-03-14 ENCOUNTER — Ambulatory Visit (INDEPENDENT_AMBULATORY_CARE_PROVIDER_SITE_OTHER): Payer: Medicare Other | Admitting: *Deleted

## 2017-03-14 DIAGNOSIS — I4891 Unspecified atrial fibrillation: Secondary | ICD-10-CM

## 2017-03-14 DIAGNOSIS — Z7902 Long term (current) use of antithrombotics/antiplatelets: Secondary | ICD-10-CM | POA: Diagnosis not present

## 2017-03-14 DIAGNOSIS — Z5181 Encounter for therapeutic drug level monitoring: Secondary | ICD-10-CM

## 2017-03-14 LAB — POCT INR: INR: 2.3

## 2017-03-15 ENCOUNTER — Other Ambulatory Visit: Payer: Self-pay | Admitting: Internal Medicine

## 2017-03-24 ENCOUNTER — Other Ambulatory Visit: Payer: Self-pay | Admitting: Internal Medicine

## 2017-03-27 DIAGNOSIS — L821 Other seborrheic keratosis: Secondary | ICD-10-CM | POA: Diagnosis not present

## 2017-03-27 DIAGNOSIS — Z85820 Personal history of malignant melanoma of skin: Secondary | ICD-10-CM | POA: Diagnosis not present

## 2017-03-27 DIAGNOSIS — D1801 Hemangioma of skin and subcutaneous tissue: Secondary | ICD-10-CM | POA: Diagnosis not present

## 2017-03-27 DIAGNOSIS — Z85828 Personal history of other malignant neoplasm of skin: Secondary | ICD-10-CM | POA: Diagnosis not present

## 2017-03-29 DIAGNOSIS — E039 Hypothyroidism, unspecified: Secondary | ICD-10-CM | POA: Diagnosis not present

## 2017-03-29 DIAGNOSIS — Z0000 Encounter for general adult medical examination without abnormal findings: Secondary | ICD-10-CM | POA: Diagnosis not present

## 2017-04-11 ENCOUNTER — Ambulatory Visit (INDEPENDENT_AMBULATORY_CARE_PROVIDER_SITE_OTHER): Payer: Medicare Other | Admitting: *Deleted

## 2017-04-11 DIAGNOSIS — I4891 Unspecified atrial fibrillation: Secondary | ICD-10-CM | POA: Diagnosis not present

## 2017-04-11 DIAGNOSIS — Z7902 Long term (current) use of antithrombotics/antiplatelets: Secondary | ICD-10-CM | POA: Diagnosis not present

## 2017-04-11 DIAGNOSIS — Z5181 Encounter for therapeutic drug level monitoring: Secondary | ICD-10-CM | POA: Diagnosis not present

## 2017-04-11 LAB — POCT INR: INR: 3.5

## 2017-05-01 ENCOUNTER — Other Ambulatory Visit: Payer: Self-pay | Admitting: Internal Medicine

## 2017-05-02 ENCOUNTER — Ambulatory Visit (INDEPENDENT_AMBULATORY_CARE_PROVIDER_SITE_OTHER): Payer: Medicare Other | Admitting: *Deleted

## 2017-05-02 DIAGNOSIS — Z5181 Encounter for therapeutic drug level monitoring: Secondary | ICD-10-CM | POA: Diagnosis not present

## 2017-05-02 DIAGNOSIS — Z7902 Long term (current) use of antithrombotics/antiplatelets: Secondary | ICD-10-CM

## 2017-05-02 DIAGNOSIS — I4891 Unspecified atrial fibrillation: Secondary | ICD-10-CM

## 2017-05-02 LAB — POCT INR: INR: 3.1

## 2017-05-11 DIAGNOSIS — L570 Actinic keratosis: Secondary | ICD-10-CM | POA: Diagnosis not present

## 2017-05-11 DIAGNOSIS — L821 Other seborrheic keratosis: Secondary | ICD-10-CM | POA: Diagnosis not present

## 2017-05-11 DIAGNOSIS — D2271 Melanocytic nevi of right lower limb, including hip: Secondary | ICD-10-CM | POA: Diagnosis not present

## 2017-05-11 DIAGNOSIS — D2272 Melanocytic nevi of left lower limb, including hip: Secondary | ICD-10-CM | POA: Diagnosis not present

## 2017-05-11 DIAGNOSIS — D692 Other nonthrombocytopenic purpura: Secondary | ICD-10-CM | POA: Diagnosis not present

## 2017-05-11 DIAGNOSIS — D2261 Melanocytic nevi of right upper limb, including shoulder: Secondary | ICD-10-CM | POA: Diagnosis not present

## 2017-05-11 DIAGNOSIS — L720 Epidermal cyst: Secondary | ICD-10-CM | POA: Diagnosis not present

## 2017-05-11 DIAGNOSIS — L820 Inflamed seborrheic keratosis: Secondary | ICD-10-CM | POA: Diagnosis not present

## 2017-05-11 DIAGNOSIS — D225 Melanocytic nevi of trunk: Secondary | ICD-10-CM | POA: Diagnosis not present

## 2017-05-11 DIAGNOSIS — Z85828 Personal history of other malignant neoplasm of skin: Secondary | ICD-10-CM | POA: Diagnosis not present

## 2017-05-11 DIAGNOSIS — L814 Other melanin hyperpigmentation: Secondary | ICD-10-CM | POA: Diagnosis not present

## 2017-05-11 DIAGNOSIS — Z85820 Personal history of malignant melanoma of skin: Secondary | ICD-10-CM | POA: Diagnosis not present

## 2017-05-18 DIAGNOSIS — H524 Presbyopia: Secondary | ICD-10-CM | POA: Diagnosis not present

## 2017-05-18 DIAGNOSIS — H532 Diplopia: Secondary | ICD-10-CM | POA: Diagnosis not present

## 2017-05-18 DIAGNOSIS — H40013 Open angle with borderline findings, low risk, bilateral: Secondary | ICD-10-CM | POA: Diagnosis not present

## 2017-05-18 DIAGNOSIS — H26493 Other secondary cataract, bilateral: Secondary | ICD-10-CM | POA: Diagnosis not present

## 2017-05-23 ENCOUNTER — Ambulatory Visit (INDEPENDENT_AMBULATORY_CARE_PROVIDER_SITE_OTHER): Payer: Medicare Other | Admitting: *Deleted

## 2017-05-23 DIAGNOSIS — Z5181 Encounter for therapeutic drug level monitoring: Secondary | ICD-10-CM | POA: Diagnosis not present

## 2017-05-23 DIAGNOSIS — Z7902 Long term (current) use of antithrombotics/antiplatelets: Secondary | ICD-10-CM | POA: Diagnosis not present

## 2017-05-23 DIAGNOSIS — I4891 Unspecified atrial fibrillation: Secondary | ICD-10-CM | POA: Diagnosis not present

## 2017-05-23 LAB — POCT INR: INR: 2.5

## 2017-06-18 ENCOUNTER — Ambulatory Visit (INDEPENDENT_AMBULATORY_CARE_PROVIDER_SITE_OTHER): Payer: Medicare Other | Admitting: *Deleted

## 2017-06-18 DIAGNOSIS — I4891 Unspecified atrial fibrillation: Secondary | ICD-10-CM

## 2017-06-18 DIAGNOSIS — Z7902 Long term (current) use of antithrombotics/antiplatelets: Secondary | ICD-10-CM

## 2017-06-18 DIAGNOSIS — Z5181 Encounter for therapeutic drug level monitoring: Secondary | ICD-10-CM | POA: Diagnosis not present

## 2017-06-18 LAB — POCT INR: INR: 2.5

## 2017-06-21 ENCOUNTER — Other Ambulatory Visit: Payer: Self-pay | Admitting: Internal Medicine

## 2017-06-21 NOTE — Telephone Encounter (Signed)
Rx(s) sent to pharmacy electronically.  

## 2017-06-25 ENCOUNTER — Other Ambulatory Visit: Payer: Self-pay | Admitting: Internal Medicine

## 2017-07-23 ENCOUNTER — Ambulatory Visit (INDEPENDENT_AMBULATORY_CARE_PROVIDER_SITE_OTHER): Payer: Medicare Other | Admitting: *Deleted

## 2017-07-23 DIAGNOSIS — I4891 Unspecified atrial fibrillation: Secondary | ICD-10-CM

## 2017-07-23 DIAGNOSIS — Z5181 Encounter for therapeutic drug level monitoring: Secondary | ICD-10-CM | POA: Diagnosis not present

## 2017-07-23 DIAGNOSIS — Z7902 Long term (current) use of antithrombotics/antiplatelets: Secondary | ICD-10-CM

## 2017-07-23 LAB — POCT INR: INR: 2.2

## 2017-07-30 ENCOUNTER — Other Ambulatory Visit: Payer: Self-pay | Admitting: Internal Medicine

## 2017-08-24 DIAGNOSIS — M65331 Trigger finger, right middle finger: Secondary | ICD-10-CM | POA: Diagnosis not present

## 2017-08-28 ENCOUNTER — Other Ambulatory Visit: Payer: Self-pay

## 2017-08-28 MED ORDER — FUROSEMIDE 20 MG PO TABS: 20.0000 mg | ORAL_TABLET | Freq: Every day | ORAL | 3 refills | Status: DC | PRN

## 2017-09-03 ENCOUNTER — Ambulatory Visit (INDEPENDENT_AMBULATORY_CARE_PROVIDER_SITE_OTHER): Payer: Medicare Other | Admitting: *Deleted

## 2017-09-03 DIAGNOSIS — G459 Transient cerebral ischemic attack, unspecified: Secondary | ICD-10-CM

## 2017-09-03 DIAGNOSIS — Z7902 Long term (current) use of antithrombotics/antiplatelets: Secondary | ICD-10-CM

## 2017-09-03 DIAGNOSIS — Z5181 Encounter for therapeutic drug level monitoring: Secondary | ICD-10-CM

## 2017-09-03 DIAGNOSIS — I4891 Unspecified atrial fibrillation: Secondary | ICD-10-CM

## 2017-09-03 LAB — POCT INR: INR: 2.1

## 2017-09-11 DIAGNOSIS — Z23 Encounter for immunization: Secondary | ICD-10-CM | POA: Diagnosis not present

## 2017-09-21 DIAGNOSIS — M65331 Trigger finger, right middle finger: Secondary | ICD-10-CM | POA: Diagnosis not present

## 2017-10-15 ENCOUNTER — Ambulatory Visit (INDEPENDENT_AMBULATORY_CARE_PROVIDER_SITE_OTHER): Payer: Medicare Other | Admitting: *Deleted

## 2017-10-15 DIAGNOSIS — Z5181 Encounter for therapeutic drug level monitoring: Secondary | ICD-10-CM

## 2017-10-15 DIAGNOSIS — G459 Transient cerebral ischemic attack, unspecified: Secondary | ICD-10-CM

## 2017-10-15 DIAGNOSIS — I4891 Unspecified atrial fibrillation: Secondary | ICD-10-CM | POA: Diagnosis not present

## 2017-10-15 DIAGNOSIS — Z7902 Long term (current) use of antithrombotics/antiplatelets: Secondary | ICD-10-CM | POA: Diagnosis not present

## 2017-10-15 LAB — POCT INR: INR: 2.6

## 2017-10-19 DIAGNOSIS — Z1389 Encounter for screening for other disorder: Secondary | ICD-10-CM | POA: Diagnosis not present

## 2017-10-19 DIAGNOSIS — K5732 Diverticulitis of large intestine without perforation or abscess without bleeding: Secondary | ICD-10-CM | POA: Diagnosis not present

## 2017-10-19 DIAGNOSIS — Z6821 Body mass index (BMI) 21.0-21.9, adult: Secondary | ICD-10-CM | POA: Diagnosis not present

## 2017-10-22 ENCOUNTER — Telehealth: Payer: Self-pay | Admitting: *Deleted

## 2017-10-22 NOTE — Telephone Encounter (Signed)
Pt diagnosed with diverticulitis.  11/16 started on metronidazole 50mg  bid and Augmentin bid x 10 days.  Will finish 11/25.  Told pt to hold coumadin tonight then decrease dose to 3mg  daily until INR check on 11/26.  She verbalized understanding.

## 2017-10-22 NOTE — Telephone Encounter (Signed)
Please give pt a call--she's started taking some antibiotics --please give her a call @ [phone]

## 2017-10-29 ENCOUNTER — Ambulatory Visit (INDEPENDENT_AMBULATORY_CARE_PROVIDER_SITE_OTHER): Payer: Medicare Other | Admitting: *Deleted

## 2017-10-29 DIAGNOSIS — I4891 Unspecified atrial fibrillation: Secondary | ICD-10-CM | POA: Diagnosis not present

## 2017-10-29 DIAGNOSIS — Z7902 Long term (current) use of antithrombotics/antiplatelets: Secondary | ICD-10-CM | POA: Diagnosis not present

## 2017-10-29 DIAGNOSIS — Z5181 Encounter for therapeutic drug level monitoring: Secondary | ICD-10-CM

## 2017-10-29 DIAGNOSIS — G459 Transient cerebral ischemic attack, unspecified: Secondary | ICD-10-CM

## 2017-10-29 LAB — POCT INR: INR: 3

## 2017-12-01 ENCOUNTER — Other Ambulatory Visit: Payer: Self-pay | Admitting: Internal Medicine

## 2017-12-10 ENCOUNTER — Ambulatory Visit (INDEPENDENT_AMBULATORY_CARE_PROVIDER_SITE_OTHER): Payer: Medicare Other | Admitting: *Deleted

## 2017-12-10 DIAGNOSIS — Z7902 Long term (current) use of antithrombotics/antiplatelets: Secondary | ICD-10-CM

## 2017-12-10 DIAGNOSIS — G459 Transient cerebral ischemic attack, unspecified: Secondary | ICD-10-CM

## 2017-12-10 DIAGNOSIS — I4891 Unspecified atrial fibrillation: Secondary | ICD-10-CM | POA: Diagnosis not present

## 2017-12-10 DIAGNOSIS — Z5181 Encounter for therapeutic drug level monitoring: Secondary | ICD-10-CM | POA: Diagnosis not present

## 2017-12-10 LAB — POCT INR: INR: 1.9

## 2017-12-10 NOTE — Patient Instructions (Signed)
Take coumadin 1 tablet tonight then resume 1/2 tablet daily except 1 tablet on Sundays, Tuesdays and Thursdays Recheck in 4 weeks

## 2017-12-20 ENCOUNTER — Other Ambulatory Visit: Payer: Self-pay | Admitting: Internal Medicine

## 2018-01-07 ENCOUNTER — Ambulatory Visit (INDEPENDENT_AMBULATORY_CARE_PROVIDER_SITE_OTHER): Payer: Medicare Other | Admitting: *Deleted

## 2018-01-07 DIAGNOSIS — I4891 Unspecified atrial fibrillation: Secondary | ICD-10-CM

## 2018-01-07 DIAGNOSIS — Z7902 Long term (current) use of antithrombotics/antiplatelets: Secondary | ICD-10-CM

## 2018-01-07 DIAGNOSIS — Z5181 Encounter for therapeutic drug level monitoring: Secondary | ICD-10-CM

## 2018-01-07 DIAGNOSIS — G459 Transient cerebral ischemic attack, unspecified: Secondary | ICD-10-CM

## 2018-01-07 LAB — POCT INR: INR: 2.4

## 2018-01-07 NOTE — Patient Instructions (Signed)
Continue coumadin 1/2 tablet daily except 1 tablet on Sundays, Tuesdays and Thursdays Recheck in 6 weeks  

## 2018-02-18 ENCOUNTER — Ambulatory Visit (INDEPENDENT_AMBULATORY_CARE_PROVIDER_SITE_OTHER): Payer: Medicare Other | Admitting: *Deleted

## 2018-02-18 DIAGNOSIS — I4891 Unspecified atrial fibrillation: Secondary | ICD-10-CM

## 2018-02-18 DIAGNOSIS — G459 Transient cerebral ischemic attack, unspecified: Secondary | ICD-10-CM

## 2018-02-18 DIAGNOSIS — Z7902 Long term (current) use of antithrombotics/antiplatelets: Secondary | ICD-10-CM

## 2018-02-18 DIAGNOSIS — Z5181 Encounter for therapeutic drug level monitoring: Secondary | ICD-10-CM

## 2018-02-18 LAB — POCT INR: INR: 1.7

## 2018-02-18 NOTE — Patient Instructions (Signed)
Take coumadin 1 tablet today then resume 1/2 tablet daily except 1 tablet on Sundays, Tuesdays and Thursdays Recheck in 3 weeks

## 2018-02-23 ENCOUNTER — Other Ambulatory Visit: Payer: Self-pay | Admitting: Internal Medicine

## 2018-03-04 DIAGNOSIS — Z1231 Encounter for screening mammogram for malignant neoplasm of breast: Secondary | ICD-10-CM | POA: Diagnosis not present

## 2018-03-04 LAB — HM MAMMOGRAPHY

## 2018-03-11 ENCOUNTER — Ambulatory Visit (INDEPENDENT_AMBULATORY_CARE_PROVIDER_SITE_OTHER): Payer: Medicare Other | Admitting: *Deleted

## 2018-03-11 DIAGNOSIS — G459 Transient cerebral ischemic attack, unspecified: Secondary | ICD-10-CM

## 2018-03-11 DIAGNOSIS — I4891 Unspecified atrial fibrillation: Secondary | ICD-10-CM | POA: Diagnosis not present

## 2018-03-11 DIAGNOSIS — Z7902 Long term (current) use of antithrombotics/antiplatelets: Secondary | ICD-10-CM | POA: Diagnosis not present

## 2018-03-11 DIAGNOSIS — Z5181 Encounter for therapeutic drug level monitoring: Secondary | ICD-10-CM

## 2018-03-11 LAB — POCT INR: INR: 1.8

## 2018-03-11 NOTE — Patient Instructions (Signed)
Take coumadin 1 tablet today then increase dose to 1 tablet daily except 1/2 tablet on Mondays, Wednesdays and Fridays Recheck in 3 weeks

## 2018-03-18 ENCOUNTER — Other Ambulatory Visit: Payer: Self-pay | Admitting: Internal Medicine

## 2018-03-18 NOTE — Telephone Encounter (Signed)
Rx has been sent to the pharmacy electronically. ° °

## 2018-04-01 ENCOUNTER — Ambulatory Visit (INDEPENDENT_AMBULATORY_CARE_PROVIDER_SITE_OTHER): Payer: Medicare Other | Admitting: *Deleted

## 2018-04-01 DIAGNOSIS — Z5181 Encounter for therapeutic drug level monitoring: Secondary | ICD-10-CM

## 2018-04-01 DIAGNOSIS — G459 Transient cerebral ischemic attack, unspecified: Secondary | ICD-10-CM

## 2018-04-01 DIAGNOSIS — I4891 Unspecified atrial fibrillation: Secondary | ICD-10-CM

## 2018-04-01 DIAGNOSIS — Z7902 Long term (current) use of antithrombotics/antiplatelets: Secondary | ICD-10-CM | POA: Diagnosis not present

## 2018-04-01 LAB — POCT INR: INR: 3.5

## 2018-04-01 NOTE — Patient Instructions (Addendum)
Hold coumadin tonight then resume 1 tablet today then increase dose to 1 tablet daily except 1/2 tablet on Mondays, Wednesdays and Fridays Increase salads/greens Recheck in 3 weeks

## 2018-04-22 ENCOUNTER — Ambulatory Visit (INDEPENDENT_AMBULATORY_CARE_PROVIDER_SITE_OTHER): Payer: Medicare Other | Admitting: *Deleted

## 2018-04-22 DIAGNOSIS — Z7902 Long term (current) use of antithrombotics/antiplatelets: Secondary | ICD-10-CM

## 2018-04-22 DIAGNOSIS — I4891 Unspecified atrial fibrillation: Secondary | ICD-10-CM

## 2018-04-22 LAB — POCT INR: INR: 2.9

## 2018-04-22 NOTE — Patient Instructions (Signed)
Continue coumadin 1 tablet daily except 1/2 tablet on Mondays, Wednesdays and Fridays Continue greens Recheck in 4 weeks 

## 2018-04-23 ENCOUNTER — Other Ambulatory Visit: Payer: Self-pay | Admitting: Internal Medicine

## 2018-04-26 ENCOUNTER — Other Ambulatory Visit: Payer: Self-pay | Admitting: Internal Medicine

## 2018-04-26 NOTE — Telephone Encounter (Signed)
Rx request sent to pharmacy.  

## 2018-05-06 ENCOUNTER — Encounter: Payer: Self-pay | Admitting: Internal Medicine

## 2018-05-06 ENCOUNTER — Ambulatory Visit (INDEPENDENT_AMBULATORY_CARE_PROVIDER_SITE_OTHER): Payer: Medicare Other | Admitting: Internal Medicine

## 2018-05-06 VITALS — BP 118/80 | HR 63 | Ht 63.5 in | Wt 121.4 lb

## 2018-05-06 DIAGNOSIS — E038 Other specified hypothyroidism: Secondary | ICD-10-CM

## 2018-05-06 DIAGNOSIS — I482 Chronic atrial fibrillation: Secondary | ICD-10-CM | POA: Diagnosis not present

## 2018-05-06 DIAGNOSIS — I4821 Permanent atrial fibrillation: Secondary | ICD-10-CM

## 2018-05-06 DIAGNOSIS — E785 Hyperlipidemia, unspecified: Secondary | ICD-10-CM

## 2018-05-06 NOTE — Progress Notes (Signed)
OFFICE NOTE  Chief Complaint: Routine follow-up  Primary Care Physician: Sharilyn Sites, MD  HPI:  Kathleen Mcconnell is a 77 year old female with a history of paroxysmal atrial fibrillation, on sotalol, who had a cardioversion in 2004 and had not had recurrence until recently. She also has signs of rheumatic heart disease on her echocardiogram with some sclerosis of the aortic valve, and moderate to severe TR and mild MR with a flat coaptation on the mitral valve. In addition, her EF is greater than 55% and she has normal atrial sizes. Recently, she had presented to the office for an annual followup and was found to be in atrial fibrillation with controlled ventricular response. She was clearly unaware that she was in atrial fibrillation; however, she did note that she was getting a little more short of breath when walking up stairs and had been a little bit more fatigued recently. She has been maintained on warfarin and has done fairly well with maintaining a therapeutic INR. She is also on diltiazem additionally for rate control and also for hypertension. I, therefore, recommended increasing her sotalol to 120 mg twice daily and set her up for a cardioversion. She underwent cardioversion on November 19, 2012, with a single 150-joule biphasic shock. She converted to a sinus rhythm and afterwards felt very well. This persisted for several weeks until recently; she started to feel tired again, not nearly as good as shortly after the procedure was performed. On followup visit, she was noted again to be in atrial fibrillation, now with slow ventricular response at a rate of 56. She claimed to be fairly symptomatic and therefore refer her to see Dr. Rayann Heman with cardiac electrophysiology. He evaluated her and felt that due to her severe tricuspid regurgitation, she was not a good candidate for atrial fibrillation ablation, as well as the fact that she is not symptomatic.  He also recommended discontinuing  sotalol and pursuing rate control. She has since transitioned over to her warfarin checks with [facility] in [place].  Kathleen Mcconnell returns today for follow-up. She is noted to be in A. fib with mild rapid ventricular response today and rates in the low 100s. There is a question about her being on both ACE and ARB which was started over 10 years ago. Although this is not current practice I have continued on this medicine because he seems to be doing well, however it is somewhat redundant. She denies any chest pain or worsening shortness of breath. I do believe she could have better rate control.  I saw Kathleen Mcconnell back today in the office for follow-up. Overall she's feeling well. She remains rate controlled in permanent A. fib. Her INRs have been therapeutic except for one short period this summer. Apparently she was seen in the ER for what was thought to be a TIA event. Her INR was slightly low however cardio workup was negative. She ultimately was reestablished to a therapeutic level of warfarin and has done well since then.  07/13/2016  Kathleen Mcconnell returns today for follow-up. She seems to be doing fairly well. She's had no further TIA events. She is accompanied by her friend today who reports that she has noted that Kathleen Mcconnell has had more shortness of breath recently. Particularly when walking up stairs. She's also had lower extremity swelling and some discoloration of her ankles. She is concerned about heart failure. Blood pressure is mildly elevated today however she found her dog dead this morning. Understandably her blood pressure would  be elevated. She denies any chest pain.  2/72/5366  Kathleen Mcconnell returns today for follow-up. She reports her swelling has significantly improved, however, she never got her compression stockings. Echo shows EF 55-60% with severe biatrial enlargement (maybe consistent with permanent a-fib) and mild to moderate MR and moderate TR with RVSP of 33 mmHg. Although she has been  compliant with warfarin and INR has been fairly well managed, it is likely that she will have greater benefit and lower bleeding risk on DOAC such as Eliquis. We discussed switching today. She was previously on Xarelto before warfarin, however, thought she may have had side-effects. Turned out not to be the medication.  4/40/3474  Kathleen Mcconnell seen today in follow-up. Overall she's had improvement in swelling. She's taking her Lasix only about 4 days a week. She reported that she felt dizzy or unwell on Eliquis. Is not quite clear but she took it for 3 months and then transition back to warfarin. Unfortunately in January this year she had a UTI and was placed on Bactrim which significantly elevated her INR to almost 15. Subsequent to that she was referred to the hospital but did not have any significant or life-threatening bleeding. INR since been fairly well-regulated and it was 3.2 on March 14. Blood pressure is well-controlled today 124/76. She reports persistent shortness of breath which comes and goes when walking up stairs, something she does on a daily basis.  01/08/9562  Kathleen Mcconnell is seen today in follow-up.  She denies any chest pain or worsening shortness of breath.  Her A. fib rate is controlled today.  Recently she is been very stable with her INR.  She says she has not had blood work possibly in the last year.    PMHx:  Past Medical History:  Diagnosis Date  . History of nuclear stress test 03/2012   lexiscan; normal pattern of perfusion; normal, low risk   . Hyperlipidemia   . Hypertension   . Hypothyroidism   . Mild aortic insufficiency   . Mitral valve regurgitation   . Persistent atrial fibrillation ([place])    History of; cardioversion in 2004  . Severe tricuspid regurgitation    on echo 02/2011 ?rheumatic heart disease  . Thyroid mass 10/10/2001   benign lesion removed    Past Surgical History:  Procedure Laterality Date  . CARDIAC CATHETERIZATION  8756   normal systolic function, near  normal coronaries   . CARDIOVERSION N/A 01/20/2013   Procedure: CARDIOVERSION;  Surgeon: Pixie Casino, MD;  Location: [facility];  Service: Cardiovascular;  Laterality: N/A;  . THYROIDECTOMY  10/10/2001  . TRANSTHORACIC ECHOCARDIOGRAM  02/2011   EF=>55%; mild MR; mod-severe TR & elevated RV systolic pressure, mild pulm HTN; aortic valve mildly sclerotic with mild regurg    FAMHx:  Family History  Problem Relation Age of Onset  . Heart disease Father        also MI  . Heart disease Brother        x 3, one deceased age 66  . Heart disease Sister     SOCHx:   reports that she has never smoked. She has never used smokeless tobacco. She reports that she does not drink alcohol or use drugs.  ALLERGIES:  Allergies  Allergen Reactions  . Aspirin Anaphylaxis, Hives, Rash and Other (See Comments)    As well as short of breath; Previously was rushed to hospital-given epinephrine injection.     ROS: Pertinent items noted in HPI and remainder of comprehensive ROS otherwise  negative.  HOME MEDS: Current Outpatient Medications  Medication Sig Dispense Refill  . acetaminophen (TYLENOL) 500 MG tablet Take 1,000 mg by mouth every 6 (six) hours as needed. For pain    . B Complex Vitamins (B COMPLEX 1 PO) Take 1 tablet by mouth daily.    Marland Kitchen CALCIUM-VITAMIN D PO Take 1 tablet by mouth 2 (two) times daily.    Marland Kitchen diltiazem (CARDIZEM CD) 240 MG 24 hr capsule TAKE (1) CAPSULE BY MOUTH ONCE DAILY. 90 capsule 0  . fish oil-omega-3 fatty acids 1000 MG capsule Take 1 g by mouth daily.    . furosemide (LASIX) 20 MG tablet Take 1 tablet (20 mg total) by mouth daily as needed for fluid or edema. 30 tablet 3  . levothyroxine (SYNTHROID, LEVOTHROID) 88 MCG tablet Take 88 mcg by mouth daily.    Marland Kitchen lisinopril (PRINIVIL,ZESTRIL) 20 MG tablet TAKE (1) TABLET BY MOUTH DAILY. 60 tablet 0  . metoprolol succinate (TOPROL-XL) 50 MG 24 hr tablet TAKE ONE TABLET BY MOUTH DAILY. 30 tablet 0  . Multiple Vitamin  (MULTIVITAMIN WITH MINERALS) TABS Take 1 tablet by mouth daily.    . rosuvastatin (CRESTOR) 5 MG tablet Take 5 mg by mouth daily.  2  . tretinoin (RETIN-A) 0.05 % cream Apply 1 application topically daily.     Marland Kitchen warfarin (COUMADIN) 6 MG tablet TAKE 1/2 TO 1 TABLET BY MOUTH DAILY AS DIRECTED BY COUMADIN CLINIC. 30 tablet 2   No current facility-administered medications for this visit.     LABS/IMAGING: No results found for this or any previous visit (from the past 48 hour(s)). No results found.  VITALS: BP 118/80   Pulse 63   Ht 5' 3.5" (1.613 m)   Wt 121 lb 6.4 oz (55.1 kg)   BMI 21.17 kg/m   EXAM: General appearance: alert and no distress Neck: no carotid bruit, no JVD and thyroid not enlarged, symmetric, no tenderness/mass/nodules Lungs: clear to auscultation bilaterally Heart: irregularly irregular rhythm Abdomen: soft, non-tender; bowel sounds normal; no masses,  no organomegaly Extremities: extremities normal, atraumatic, no cyanosis or edema and venous stasis dermatitis noted Pulses: 2+ and symmetric Skin: Skin color, texture, turgor normal. No rashes or lesions Neurologic: Grossly normal Psych: Pleasant  EKG: A. fib at 63-personally reviewed  ASSESSMENT: 1. DOE-  Echo with normal LV function, severe biatrial enlargement - mild to moderate MR, moderate TR 2. Lower extremity edema 3. Permanent atrial fibrillation-asymptomatic 4. Hypertension-controlled 5. Long-term anticoagulation on warfarin 6. History of TIA  PLAN: 1.   Kathleen Mcconnell denies any chest pain or worsening shortness of breath.  Her INR is been pretty stable.  She prefers to remain on warfarin long-term.  She is in permanent A. fib.  She denies any recent edema.  She has had no recurrent TIA or stroke episodes.  We will plan repeat labs including metabolic profile, CBC, TSH and fasting lipid profile.  Follow-up with me annually or sooner as necessary.  Pixie Casino, MD, [facility], [place] Director of the Advanced Lipid Disorders &  Cardiovascular Risk [facility] Diplomate of the American Board of Clinical Lipidology Attending Cardiologist  Direct Dial: [phone]  Fax: [phone]  Website:  [email or web address] Dara Camargo 05/06/2018, 1:35 PM

## 2018-05-06 NOTE — Patient Instructions (Signed)
Your physician recommends that you return for lab work FASTING (CBC, CMET, TSH, Lipid)  Your physician wants you to follow-up in: ONE YEAR with Dr. Debara Pickett. You will receive a reminder letter in the mail two months in advance. If you don't receive a letter, please call our office to schedule the follow-up appointment.

## 2018-05-09 DIAGNOSIS — E038 Other specified hypothyroidism: Secondary | ICD-10-CM | POA: Diagnosis not present

## 2018-05-09 DIAGNOSIS — I482 Chronic atrial fibrillation: Secondary | ICD-10-CM | POA: Diagnosis not present

## 2018-05-09 DIAGNOSIS — E785 Hyperlipidemia, unspecified: Secondary | ICD-10-CM | POA: Diagnosis not present

## 2018-05-10 LAB — COMPREHENSIVE METABOLIC PANEL
ALT: 25 IU/L (ref 0–32)
AST: 42 IU/L — ABNORMAL HIGH (ref 0–40)
Albumin/Globulin Ratio: 2.3 — ABNORMAL HIGH (ref 1.2–2.2)
Albumin: 4.6 g/dL (ref 3.5–4.8)
Alkaline Phosphatase: 71 IU/L (ref 39–117)
BUN/Creatinine Ratio: 21 (ref 12–28)
BUN: 14 mg/dL (ref 8–27)
Bilirubin Total: 0.8 mg/dL (ref 0.0–1.2)
CO2: 23 mmol/L (ref 20–29)
Calcium: 9.5 mg/dL (ref 8.7–10.3)
Chloride: 103 mmol/L (ref 96–106)
Creatinine, Ser: 0.68 mg/dL (ref 0.57–1.00)
GFR calc Af Amer: 97 mL/min/{1.73_m2} (ref >59)
GFR calc non Af Amer: 84 mL/min/{1.73_m2} (ref >59)
Globulin, Total: 2 g/dL (ref 1.5–4.5)
Glucose: 81 mg/dL (ref 65–99)
Potassium: 4.3 mmol/L (ref 3.5–5.2)
Sodium: 143 mmol/L (ref 134–144)
Total Protein: 6.6 g/dL (ref 6.0–8.5)

## 2018-05-10 LAB — LIPID PANEL
Chol/HDL Ratio: 1.9 ratio (ref 0.0–4.4)
Cholesterol, Total: 145 mg/dL (ref 100–199)
HDL: 78 mg/dL (ref >39)
LDL Calculated: 53 mg/dL (ref 0–99)
Triglycerides: 69 mg/dL (ref 0–149)
VLDL Cholesterol Cal: 14 mg/dL (ref 5–40)

## 2018-05-10 LAB — CBC
Hematocrit: 41 % (ref 34.0–46.6)
Hemoglobin: 14.4 g/dL (ref 11.1–15.9)
MCH: 32.1 pg (ref 26.6–33.0)
MCHC: 35.1 g/dL (ref 31.5–35.7)
MCV: 92 fL (ref 79–97)
Platelets: 212 10*3/uL (ref 150–450)
RBC: 4.48 x10E6/uL (ref 3.77–5.28)
RDW: 13.9 % (ref 12.3–15.4)
WBC: 5.8 10*3/uL (ref 3.4–10.8)

## 2018-05-10 LAB — TSH: TSH: 1.08 u[IU]/mL (ref 0.450–4.500)

## 2018-05-14 DIAGNOSIS — L570 Actinic keratosis: Secondary | ICD-10-CM | POA: Diagnosis not present

## 2018-05-14 DIAGNOSIS — L817 Pigmented purpuric dermatosis: Secondary | ICD-10-CM | POA: Diagnosis not present

## 2018-05-14 DIAGNOSIS — Z85828 Personal history of other malignant neoplasm of skin: Secondary | ICD-10-CM | POA: Diagnosis not present

## 2018-05-14 DIAGNOSIS — L820 Inflamed seborrheic keratosis: Secondary | ICD-10-CM | POA: Diagnosis not present

## 2018-05-14 DIAGNOSIS — D692 Other nonthrombocytopenic purpura: Secondary | ICD-10-CM | POA: Diagnosis not present

## 2018-05-14 DIAGNOSIS — L918 Other hypertrophic disorders of the skin: Secondary | ICD-10-CM | POA: Diagnosis not present

## 2018-05-14 DIAGNOSIS — D225 Melanocytic nevi of trunk: Secondary | ICD-10-CM | POA: Diagnosis not present

## 2018-05-14 DIAGNOSIS — Z85820 Personal history of malignant melanoma of skin: Secondary | ICD-10-CM | POA: Diagnosis not present

## 2018-05-14 DIAGNOSIS — D2261 Melanocytic nevi of right upper limb, including shoulder: Secondary | ICD-10-CM | POA: Diagnosis not present

## 2018-05-14 DIAGNOSIS — L821 Other seborrheic keratosis: Secondary | ICD-10-CM | POA: Diagnosis not present

## 2018-05-14 DIAGNOSIS — D2271 Melanocytic nevi of right lower limb, including hip: Secondary | ICD-10-CM | POA: Diagnosis not present

## 2018-05-14 DIAGNOSIS — D2272 Melanocytic nevi of left lower limb, including hip: Secondary | ICD-10-CM | POA: Diagnosis not present

## 2018-05-20 ENCOUNTER — Ambulatory Visit (INDEPENDENT_AMBULATORY_CARE_PROVIDER_SITE_OTHER): Payer: Medicare Other | Admitting: *Deleted

## 2018-05-20 DIAGNOSIS — Z7902 Long term (current) use of antithrombotics/antiplatelets: Secondary | ICD-10-CM | POA: Diagnosis not present

## 2018-05-20 DIAGNOSIS — I4891 Unspecified atrial fibrillation: Secondary | ICD-10-CM | POA: Diagnosis not present

## 2018-05-20 LAB — POCT INR: INR: 2.9 (ref 2.0–3.0)

## 2018-05-20 NOTE — Patient Instructions (Signed)
Continue coumadin 1 tablet daily except 1/2 tablet on Mondays, Wednesdays and Fridays Continue greens Recheck in 5 weeks

## 2018-05-25 ENCOUNTER — Other Ambulatory Visit: Payer: Self-pay | Admitting: Internal Medicine

## 2018-05-27 NOTE — Telephone Encounter (Signed)
Rx request sent to pharmacy.  

## 2018-05-31 DIAGNOSIS — H52203 Unspecified astigmatism, bilateral: Secondary | ICD-10-CM | POA: Diagnosis not present

## 2018-05-31 DIAGNOSIS — Z961 Presence of intraocular lens: Secondary | ICD-10-CM | POA: Diagnosis not present

## 2018-05-31 DIAGNOSIS — H532 Diplopia: Secondary | ICD-10-CM | POA: Diagnosis not present

## 2018-06-15 ENCOUNTER — Other Ambulatory Visit: Payer: Self-pay | Admitting: Internal Medicine

## 2018-06-24 ENCOUNTER — Other Ambulatory Visit: Payer: Self-pay | Admitting: Internal Medicine

## 2018-06-24 ENCOUNTER — Ambulatory Visit (INDEPENDENT_AMBULATORY_CARE_PROVIDER_SITE_OTHER): Payer: Medicare Other | Admitting: *Deleted

## 2018-06-24 DIAGNOSIS — I4891 Unspecified atrial fibrillation: Secondary | ICD-10-CM | POA: Diagnosis not present

## 2018-06-24 DIAGNOSIS — Z7902 Long term (current) use of antithrombotics/antiplatelets: Secondary | ICD-10-CM

## 2018-06-24 DIAGNOSIS — Z5181 Encounter for therapeutic drug level monitoring: Secondary | ICD-10-CM

## 2018-06-24 LAB — POCT INR: INR: 3.8 — AB (ref 2.0–3.0)

## 2018-06-24 MED ORDER — WARFARIN SODIUM 6 MG PO TABS: ORAL_TABLET | ORAL | 3 refills | Status: DC

## 2018-06-24 NOTE — Patient Instructions (Signed)
Hold coumadin tonight then resume 1 tablet daily except 1/2 tablet on Mondays, Wednesdays and Fridays (Right now pt has a 3mg  tablet because drug store didn't have 6mg  tablets) Continue greens Recheck in 3 weeks

## 2018-07-15 ENCOUNTER — Ambulatory Visit (INDEPENDENT_AMBULATORY_CARE_PROVIDER_SITE_OTHER): Payer: Medicare Other | Admitting: *Deleted

## 2018-07-15 DIAGNOSIS — Z7902 Long term (current) use of antithrombotics/antiplatelets: Secondary | ICD-10-CM | POA: Diagnosis not present

## 2018-07-15 DIAGNOSIS — I4891 Unspecified atrial fibrillation: Secondary | ICD-10-CM

## 2018-07-15 DIAGNOSIS — G459 Transient cerebral ischemic attack, unspecified: Secondary | ICD-10-CM | POA: Diagnosis not present

## 2018-07-15 LAB — POCT INR: INR: 3.2 — AB (ref 2.0–3.0)

## 2018-07-15 NOTE — Patient Instructions (Signed)
Hold coumadin tonight then decrease dose to 3mg  daily except 6mg  on Sundays, Tuesdays and Thursdays  (Right now pt has a 3mg  tablet because drug store didn't have 6mg  tablets) Continue greens Recheck in 3 weeks

## 2018-08-09 ENCOUNTER — Ambulatory Visit (INDEPENDENT_AMBULATORY_CARE_PROVIDER_SITE_OTHER): Payer: Medicare Other | Admitting: *Deleted

## 2018-08-09 DIAGNOSIS — I4891 Unspecified atrial fibrillation: Secondary | ICD-10-CM | POA: Diagnosis not present

## 2018-08-09 DIAGNOSIS — Z5181 Encounter for therapeutic drug level monitoring: Secondary | ICD-10-CM

## 2018-08-09 LAB — POCT INR: INR: 2.6 (ref 2.0–3.0)

## 2018-08-09 NOTE — Patient Instructions (Signed)
Continue coumadin 3mg  daily except 6mg  on Sundays, Tuesdays and Thursdays  (Right now pt has a 3mg  tablet because drug store didn't have 6mg  tablets) Continue greens Recheck in 4 weeks

## 2018-09-09 ENCOUNTER — Ambulatory Visit (INDEPENDENT_AMBULATORY_CARE_PROVIDER_SITE_OTHER): Payer: Medicare Other | Admitting: *Deleted

## 2018-09-09 DIAGNOSIS — I4891 Unspecified atrial fibrillation: Secondary | ICD-10-CM

## 2018-09-09 DIAGNOSIS — Z5181 Encounter for therapeutic drug level monitoring: Secondary | ICD-10-CM

## 2018-09-09 LAB — POCT INR: INR: 3 (ref 2.0–3.0)

## 2018-09-09 NOTE — Patient Instructions (Signed)
Continue coumadin 3mg  daily except 6mg  on Sundays, Tuesdays and Thursdays Continue greens Recheck in 5 weeks

## 2018-09-17 ENCOUNTER — Other Ambulatory Visit: Payer: Self-pay | Admitting: Internal Medicine

## 2018-09-17 NOTE — Telephone Encounter (Signed)
Rx has been sent to the pharmacy electronically. ° °

## 2018-09-20 DIAGNOSIS — Z23 Encounter for immunization: Secondary | ICD-10-CM | POA: Diagnosis not present

## 2018-09-23 ENCOUNTER — Other Ambulatory Visit: Payer: Self-pay | Admitting: Internal Medicine

## 2018-10-14 ENCOUNTER — Ambulatory Visit (INDEPENDENT_AMBULATORY_CARE_PROVIDER_SITE_OTHER): Payer: Medicare Other | Admitting: *Deleted

## 2018-10-14 DIAGNOSIS — Z7902 Long term (current) use of antithrombotics/antiplatelets: Secondary | ICD-10-CM | POA: Diagnosis not present

## 2018-10-14 DIAGNOSIS — Z5181 Encounter for therapeutic drug level monitoring: Secondary | ICD-10-CM | POA: Diagnosis not present

## 2018-10-14 DIAGNOSIS — I4891 Unspecified atrial fibrillation: Secondary | ICD-10-CM

## 2018-10-14 LAB — POCT INR: INR: 3.2 — AB (ref 2.0–3.0)

## 2018-10-14 NOTE — Patient Instructions (Signed)
Continue coumadin 3mg  daily except 6mg  on Sundays, Tuesdays and Thursdays Continue greens Recheck in 4 weeks

## 2018-10-24 ENCOUNTER — Other Ambulatory Visit: Payer: Self-pay

## 2018-11-04 DIAGNOSIS — Z0001 Encounter for general adult medical examination with abnormal findings: Secondary | ICD-10-CM | POA: Diagnosis not present

## 2018-11-04 DIAGNOSIS — Z1389 Encounter for screening for other disorder: Secondary | ICD-10-CM | POA: Diagnosis not present

## 2018-11-04 DIAGNOSIS — Z6820 Body mass index (BMI) 20.0-20.9, adult: Secondary | ICD-10-CM | POA: Diagnosis not present

## 2018-11-07 ENCOUNTER — Other Ambulatory Visit (HOSPITAL_COMMUNITY): Payer: Self-pay | Admitting: Physician Assistant

## 2018-11-07 DIAGNOSIS — E2839 Other primary ovarian failure: Secondary | ICD-10-CM

## 2018-11-11 ENCOUNTER — Ambulatory Visit (INDEPENDENT_AMBULATORY_CARE_PROVIDER_SITE_OTHER): Payer: Medicare Other | Admitting: *Deleted

## 2018-11-11 DIAGNOSIS — I4891 Unspecified atrial fibrillation: Secondary | ICD-10-CM

## 2018-11-11 DIAGNOSIS — Z7902 Long term (current) use of antithrombotics/antiplatelets: Secondary | ICD-10-CM

## 2018-11-11 DIAGNOSIS — Z5181 Encounter for therapeutic drug level monitoring: Secondary | ICD-10-CM

## 2018-11-11 LAB — POCT INR: INR: 2 (ref 2.0–3.0)

## 2018-11-11 NOTE — Patient Instructions (Addendum)
Take coumadin 6mg  tonight then resume 3mg  daily except 6mg  on Sundays, Tuesdays and Thursdays  Continue greens Recheck in 5 weeks

## 2018-12-16 ENCOUNTER — Ambulatory Visit (INDEPENDENT_AMBULATORY_CARE_PROVIDER_SITE_OTHER): Payer: Medicare Other | Admitting: Pharmacist

## 2018-12-16 DIAGNOSIS — I4891 Unspecified atrial fibrillation: Secondary | ICD-10-CM

## 2018-12-16 DIAGNOSIS — G459 Transient cerebral ischemic attack, unspecified: Secondary | ICD-10-CM | POA: Diagnosis not present

## 2018-12-16 DIAGNOSIS — Z5181 Encounter for therapeutic drug level monitoring: Secondary | ICD-10-CM | POA: Diagnosis not present

## 2018-12-16 LAB — POCT INR: INR: 2.4 (ref 2.0–3.0)

## 2018-12-16 NOTE — Patient Instructions (Signed)
Description   Continue 3mg  daily except 6mg  on Sundays, Tuesdays and Thursdays  Continue greens Recheck in 6 weeks

## 2019-01-27 ENCOUNTER — Ambulatory Visit (INDEPENDENT_AMBULATORY_CARE_PROVIDER_SITE_OTHER): Payer: Medicare Other | Admitting: *Deleted

## 2019-01-27 DIAGNOSIS — Z5181 Encounter for therapeutic drug level monitoring: Secondary | ICD-10-CM | POA: Diagnosis not present

## 2019-01-27 DIAGNOSIS — G459 Transient cerebral ischemic attack, unspecified: Secondary | ICD-10-CM

## 2019-01-27 DIAGNOSIS — I4891 Unspecified atrial fibrillation: Secondary | ICD-10-CM

## 2019-01-27 LAB — POCT INR: INR: 2.8 (ref 2.0–3.0)

## 2019-01-27 NOTE — Patient Instructions (Signed)
Continue 3mg  daily except 6mg  on Sundays, Tuesdays and Thursdays  Continue greens Recheck in 6 weeks

## 2019-03-07 ENCOUNTER — Telehealth: Payer: Self-pay | Admitting: Internal Medicine

## 2019-03-07 NOTE — Telephone Encounter (Signed)
°  COVID-19 Pre-Screening Questions:   Do you currently have a fever? No    Have you recently travelled on a cruise, internationally, or to [place], [place], [place], [place], [place], or [place], [place] [employer]) ? No   Have you been in contact with someone that is currently pending confirmation of Covid19 testing or has been confirmed to have the [place] virus?   No  Are you currently experiencing fatigue or cough?NO

## 2019-03-10 ENCOUNTER — Other Ambulatory Visit: Payer: Self-pay

## 2019-03-10 ENCOUNTER — Ambulatory Visit (INDEPENDENT_AMBULATORY_CARE_PROVIDER_SITE_OTHER): Payer: Medicare Other | Admitting: *Deleted

## 2019-03-10 DIAGNOSIS — Z5181 Encounter for therapeutic drug level monitoring: Secondary | ICD-10-CM | POA: Diagnosis not present

## 2019-03-10 DIAGNOSIS — I4891 Unspecified atrial fibrillation: Secondary | ICD-10-CM

## 2019-03-10 LAB — POCT INR: INR: 1.9 — AB (ref 2.0–3.0)

## 2019-03-10 NOTE — Patient Instructions (Signed)
Take 6mg  tonight then resume 3mg  daily except 6mg  on Sundays, Tuesdays and Thursdays  Continue greens Recheck in 6 weeks

## 2019-04-18 ENCOUNTER — Telehealth: Payer: Self-pay | Admitting: *Deleted

## 2019-04-18 NOTE — Telephone Encounter (Signed)
° ° °  COVID-19 Pre-Screening Questions:   In the past 7 to 10 days have you had a cough,  shortness of breath, headache, congestion, fever, body aches, chills, sore throat, or sudden loss of taste or sense of smell?   no  Have you been around anyone with known Covid 19.  no  Have you been around anyone who is awaiting Covid 19 test results in the past 7 to 10 days?   no  Have you been around anyone who has been exposed to Covid 19, or has mentioned symptoms of Covid 19 within the past 7 to 10 days?   no  If you have any concerns about symptoms your patients report please contact your leadership team, or the provider the patient is seeing in the office for further guidance.

## 2019-04-21 ENCOUNTER — Ambulatory Visit (INDEPENDENT_AMBULATORY_CARE_PROVIDER_SITE_OTHER): Payer: Medicare Other | Admitting: *Deleted

## 2019-04-21 ENCOUNTER — Other Ambulatory Visit: Payer: Self-pay

## 2019-04-21 DIAGNOSIS — I4891 Unspecified atrial fibrillation: Secondary | ICD-10-CM

## 2019-04-21 DIAGNOSIS — Z5181 Encounter for therapeutic drug level monitoring: Secondary | ICD-10-CM

## 2019-04-21 LAB — POCT INR: INR: 2.3 (ref 2.0–3.0)

## 2019-04-21 NOTE — Patient Instructions (Signed)
Continue coumadin 3mg  daily except 6mg  on Sundays, Tuesdays and Thursdays  Continue greens Recheck in 6 weeks

## 2019-05-19 ENCOUNTER — Telehealth: Payer: Self-pay | Admitting: Internal Medicine

## 2019-05-19 ENCOUNTER — Other Ambulatory Visit: Payer: Self-pay

## 2019-05-19 ENCOUNTER — Ambulatory Visit (INDEPENDENT_AMBULATORY_CARE_PROVIDER_SITE_OTHER): Payer: Medicare Other | Admitting: Internal Medicine

## 2019-05-19 ENCOUNTER — Encounter: Payer: Self-pay | Admitting: Internal Medicine

## 2019-05-19 VITALS — BP 141/79 | HR 63 | Temp 97.7°F | Ht 61.0 in | Wt 119.6 lb

## 2019-05-19 DIAGNOSIS — Z7902 Long term (current) use of antithrombotics/antiplatelets: Secondary | ICD-10-CM

## 2019-05-19 DIAGNOSIS — E038 Other specified hypothyroidism: Secondary | ICD-10-CM | POA: Diagnosis not present

## 2019-05-19 DIAGNOSIS — E785 Hyperlipidemia, unspecified: Secondary | ICD-10-CM | POA: Diagnosis not present

## 2019-05-19 DIAGNOSIS — I4891 Unspecified atrial fibrillation: Secondary | ICD-10-CM | POA: Diagnosis not present

## 2019-05-19 NOTE — Telephone Encounter (Signed)
LMTCB - please offer virtual appointment if patient calls back

## 2019-05-19 NOTE — Progress Notes (Signed)
OFFICE NOTE  Chief Complaint: Routine follow-up  Primary Care Physician: Sharilyn Sites, MD  HPI:  Kathleen Mcconnell is a 78 year old female with a history of paroxysmal atrial fibrillation, on sotalol, who had a cardioversion in 2004 and had not had recurrence until recently. She also has signs of rheumatic heart disease on her echocardiogram with some sclerosis of the aortic valve, and moderate to severe TR and mild MR with a flat coaptation on the mitral valve. In addition, her EF is greater than 55% and she has normal atrial sizes. Recently, she had presented to the office for an annual followup and was found to be in atrial fibrillation with controlled ventricular response. She was clearly unaware that she was in atrial fibrillation; however, she did note that she was getting a little more short of breath when walking up stairs and had been a little bit more fatigued recently. She has been maintained on warfarin and has done fairly well with maintaining a therapeutic INR. She is also on diltiazem additionally for rate control and also for hypertension. I, therefore, recommended increasing her sotalol to 120 mg twice daily and set her up for a cardioversion. She underwent cardioversion on November 19, 2012, with a single 150-joule biphasic shock. She converted to a sinus rhythm and afterwards felt very well. This persisted for several weeks until recently; she started to feel tired again, not nearly as good as shortly after the procedure was performed. On followup visit, she was noted again to be in atrial fibrillation, now with slow ventricular response at a rate of 56. She claimed to be fairly symptomatic and therefore refer her to see Dr. Rayann Heman with cardiac electrophysiology. He evaluated her and felt that due to her severe tricuspid regurgitation, she was not a good candidate for atrial fibrillation ablation, as well as the fact that she is not symptomatic.  He also recommended discontinuing  sotalol and pursuing rate control. She has since transitioned over to her warfarin checks with [facility] in [place].  Kathleen Mcconnell returns today for follow-up. She is noted to be in A. fib with mild rapid ventricular response today and rates in the low 100s. There is a question about her being on both ACE and ARB which was started over 10 years ago. Although this is not current practice I have continued on this medicine because he seems to be doing well, however it is somewhat redundant. She denies any chest pain or worsening shortness of breath. I do believe she could have better rate control.  I saw [name] back today in the office for follow-up. Overall she's feeling well. She remains rate controlled in permanent A. fib. Her INRs have been therapeutic except for one short period this summer. Apparently she was seen in the ER for what was thought to be a TIA event. Her INR was slightly low however cardio workup was negative. She ultimately was reestablished to a therapeutic level of warfarin and has done well since then.  07/13/2016  Kathleen Mcconnell returns today for follow-up. She seems to be doing fairly well. She's had no further TIA events. She is accompanied by her friend today who reports that she has noted that Mrs. Heitzenrater has had more shortness of breath recently. Particularly when walking up stairs. She's also had lower extremity swelling and some discoloration of her ankles. She is concerned about heart failure. Blood pressure is mildly elevated today however she found her dog dead this morning. Understandably her blood pressure would  be elevated. She denies any chest pain.  07/14/5725  Kathleen Mcconnell returns today for follow-up. She reports her swelling has significantly improved, however, she never got her compression stockings. Echo shows EF 55-60% with severe biatrial enlargement (maybe consistent with permanent a-fib) and mild to moderate MR and moderate TR with RVSP of 33 mmHg. Although she has been  compliant with warfarin and INR has been fairly well managed, it is likely that she will have greater benefit and lower bleeding risk on DOAC such as Eliquis. We discussed switching today. She was previously on Xarelto before warfarin, however, thought she may have had side-effects. Turned out not to be the medication.  01/07/5596  Kathleen Mcconnell seen today in follow-up. Overall she's had improvement in swelling. She's taking her Lasix only about 4 days a week. She reported that she felt dizzy or unwell on Eliquis. Is not quite clear but she took it for 3 months and then transition back to warfarin. Unfortunately in January this year she had a UTI and was placed on Bactrim which significantly elevated her INR to almost 15. Subsequent to that she was referred to the hospital but did not have any significant or life-threatening bleeding. INR since been fairly well-regulated and it was 3.2 on March 14. Blood pressure is well-controlled today 124/76. She reports persistent shortness of breath which comes and goes when walking up stairs, something she does on a daily basis.  03/05/6383  Kathleen Mcconnell is seen today in follow-up.  She denies any chest pain or worsening shortness of breath.  Her A. fib rate is controlled today.  Recently she is been very stable with her INR.  She says she has not had blood work possibly in the last year.    5/36/4680  Kathleen Mcconnell is seen today in follow-up.  This is a routine annual visit.  She denies any chest pain or worsening shortness of breath.  She is in persistent A. fib but is rate controlled and unaware of it.  Her INRs have been stable being checked every 4 to 6 weeks.  She is not had any recent blood work or follow-up with her PCP.  Blood pressure was slightly elevated today however reportedly is better at home.  PMHx:  Past Medical History:  Diagnosis Date  . History of nuclear stress test 03/2012   lexiscan; normal pattern of perfusion; normal, low risk   . Hyperlipidemia   .  Hypertension   . Hypothyroidism   . Mild aortic insufficiency   . Mitral valve regurgitation   . Persistent atrial fibrillation    History of; cardioversion in 2004  . Severe tricuspid regurgitation    on echo 02/2011 ?rheumatic heart disease  . Thyroid mass 10/10/2001   benign lesion removed    Past Surgical History:  Procedure Laterality Date  . CARDIAC CATHETERIZATION  3212   normal systolic function, near normal coronaries   . CARDIOVERSION N/A 01/20/2013   Procedure: CARDIOVERSION;  Surgeon: Pixie Casino, MD;  Location: [facility];  Service: Cardiovascular;  Laterality: N/A;  . THYROIDECTOMY  10/10/2001  . TRANSTHORACIC ECHOCARDIOGRAM  02/2011   EF=>55%; mild MR; mod-severe TR & elevated RV systolic pressure, mild pulm HTN; aortic valve mildly sclerotic with mild regurg    FAMHx:  Family History  Problem Relation Age of Onset  . Heart disease Father        also MI  . Heart disease Brother        x 3, one deceased age 85  . Heart  disease Sister     SOCHx:   reports that she has never smoked. She has never used smokeless tobacco. She reports that she does not drink alcohol or use drugs.  ALLERGIES:  Allergies  Allergen Reactions  . Aspirin Anaphylaxis, Hives, Rash and Other (See Comments)    As well as short of breath; Previously was rushed to hospital-given epinephrine injection.     ROS: Pertinent items noted in HPI and remainder of comprehensive ROS otherwise negative.  HOME MEDS: Current Outpatient Medications  Medication Sig Dispense Refill  . acetaminophen (TYLENOL) 500 MG tablet Take 1,000 mg by mouth every 6 (six) hours as needed. For pain    . B Complex Vitamins (B COMPLEX 1 PO) Take 1 tablet by mouth daily.    Marland Kitchen CALCIUM-VITAMIN D PO Take 1 tablet by mouth 2 (two) times daily.    Marland Kitchen diltiazem (CARDIZEM CD) 240 MG 24 hr capsule TAKE (1) CAPSULE BY MOUTH ONCE DAILY. 90 capsule 2  . fish oil-omega-3 fatty acids 1000 MG capsule Take 1 g by mouth daily.     . furosemide (LASIX) 20 MG tablet Take 1 tablet (20 mg total) by mouth daily as needed for fluid or edema. 30 tablet 3  . levothyroxine (SYNTHROID, LEVOTHROID) 88 MCG tablet Take 88 mcg by mouth daily.    Marland Kitchen lisinopril (PRINIVIL,ZESTRIL) 20 MG tablet TAKE (1) TABLET BY MOUTH DAILY. 90 tablet 2  . metoprolol succinate (TOPROL-XL) 50 MG 24 hr tablet TAKE ONE TABLET BY MOUTH DAILY. 90 tablet 2  . Multiple Vitamin (MULTIVITAMIN WITH MINERALS) TABS Take 1 tablet by mouth daily.    . rosuvastatin (CRESTOR) 5 MG tablet Take 5 mg by mouth daily.  2  . warfarin (COUMADIN) 6 MG tablet Take 1 tablet daily except 1/2 tablet on Mondays, Wednesdays and Fridays 90 tablet 3   No current facility-administered medications for this visit.     LABS/IMAGING: No results found for this or any previous visit (from the past 48 hour(s)). No results found.  VITALS: BP (!) 141/79   Pulse 63   Temp 97.7 F (36.5 C)   Ht 5\' 1"  (1.549 m)   Wt 119 lb 9.6 oz (54.3 kg)   BMI 22.60 kg/m   EXAM: General appearance: alert and no distress Neck: no carotid bruit, no JVD and thyroid not enlarged, symmetric, no tenderness/mass/nodules Lungs: clear to auscultation bilaterally Heart: irregularly irregular rhythm Abdomen: soft, non-tender; bowel sounds normal; no masses,  no organomegaly Extremities: extremities normal, atraumatic, no cyanosis or edema and venous stasis dermatitis noted Pulses: 2+ and symmetric Skin: Skin color, texture, turgor normal. No rashes or lesions Neurologic: Grossly normal Psych: Pleasant  EKG: A. fib at 63-personally reviewed  ASSESSMENT: 1. DOE-  Echo with normal LV function, severe biatrial enlargement - mild to moderate MR, moderate TR 2. Lower extremity edema 3. Permanent atrial fibrillation-asymptomatic 4. Hypertension-controlled 5. Long-term anticoagulation on warfarin 6. History of TIA  PLAN: 1.   Mrs. Icenhour seems to be doing well.  She denies any chest pain or worsening  shortness of breath.  She is unaware of her A. fib.  Her INRs have been stable on warfarin.  She prefers to be on this over and direct oral anticoagulant.  She does have history of TIA.  Blood pressure has been well controlled although mildly elevated today.  No changes were made to her medicines.  We will go ahead and get routine lab work for monitoring as she is not regularly seeing her  PCP.  Follow-up with me annually or sooner as necessary.  Pixie Casino, MD, [facility], [place] Director of the [facility]  [facility] Diplomate of the American Board of Clinical Lipidology Attending Cardiologist  Direct Dial: [phone]  Fax: [phone]  Website:  [email or web address]  05/19/2019, 2:52 PM

## 2019-05-19 NOTE — Patient Instructions (Signed)
Medication Instructions:  Your physician recommends that you continue on your current medications as directed. Please refer to the Current Medication list given to you today.  If you need a refill on your cardiac medications before your next appointment, please call your pharmacy.   Lab work: FASTING lab work - TSH, lipid, CMET, CBC If you have labs (blood work) drawn today and your tests are completely normal, you will receive your results only by: Marland Kitchen MyChart Message (if you have MyChart) OR . A paper copy in the mail If you have any lab test that is abnormal or we need to change your treatment, we will call you to review the results.  Follow-Up: At [facility], you and your health needs are our priority.  As part of our continuing mission to provide you with exceptional heart care, we have created designated Provider Care Teams.  These Care Teams include your primary Cardiologist (physician) and Advanced Practice Providers (APPs -  Physician Assistants and Nurse Practitioners) who all work together to provide you with the care you need, when you need it. You will need a follow up appointment in 12 months.  Please call our office 2 months in advance to schedule this appointment.  You may see Dr. Debara Pickett or one of the following Advanced Practice Providers on your designated Care Team: Almyra Deforest, [place] . Fabian Sharp, PA-C  Any Other Special Instructions Will Be Listed Below (If Applicable).

## 2019-06-02 ENCOUNTER — Other Ambulatory Visit: Payer: Self-pay

## 2019-06-02 ENCOUNTER — Ambulatory Visit (INDEPENDENT_AMBULATORY_CARE_PROVIDER_SITE_OTHER): Payer: Medicare Other | Admitting: *Deleted

## 2019-06-02 DIAGNOSIS — Z5181 Encounter for therapeutic drug level monitoring: Secondary | ICD-10-CM

## 2019-06-02 DIAGNOSIS — I4891 Unspecified atrial fibrillation: Secondary | ICD-10-CM | POA: Diagnosis not present

## 2019-06-02 LAB — POCT INR: INR: 3 (ref 2.0–3.0)

## 2019-06-02 NOTE — Patient Instructions (Signed)
Continue coumadin 3mg  daily except 6mg  on Sundays, Tuesdays and Thursdays  Continue greens Recheck in 6 weeks

## 2019-06-03 DIAGNOSIS — Z961 Presence of intraocular lens: Secondary | ICD-10-CM | POA: Diagnosis not present

## 2019-06-03 DIAGNOSIS — H532 Diplopia: Secondary | ICD-10-CM | POA: Diagnosis not present

## 2019-06-03 DIAGNOSIS — H5213 Myopia, bilateral: Secondary | ICD-10-CM | POA: Diagnosis not present

## 2019-06-10 ENCOUNTER — Other Ambulatory Visit: Payer: Self-pay | Admitting: Internal Medicine

## 2019-06-13 DIAGNOSIS — Z7902 Long term (current) use of antithrombotics/antiplatelets: Secondary | ICD-10-CM | POA: Diagnosis not present

## 2019-06-13 DIAGNOSIS — E038 Other specified hypothyroidism: Secondary | ICD-10-CM | POA: Diagnosis not present

## 2019-06-13 DIAGNOSIS — E785 Hyperlipidemia, unspecified: Secondary | ICD-10-CM | POA: Diagnosis not present

## 2019-06-13 LAB — LIPID PANEL

## 2019-06-14 LAB — COMPREHENSIVE METABOLIC PANEL
ALT: 21 IU/L (ref 0–32)
AST: 33 IU/L (ref 0–40)
Albumin/Globulin Ratio: 1.9 (ref 1.2–2.2)
Albumin: 4.6 g/dL (ref 3.7–4.7)
Alkaline Phosphatase: 65 IU/L (ref 39–117)
BUN/Creatinine Ratio: 17 (ref 12–28)
BUN: 14 mg/dL (ref 8–27)
Bilirubin Total: 0.7 mg/dL (ref 0.0–1.2)
CO2: 22 mmol/L (ref 20–29)
Calcium: 9.6 mg/dL (ref 8.7–10.3)
Chloride: 102 mmol/L (ref 96–106)
Creatinine, Ser: 0.84 mg/dL (ref 0.57–1.00)
GFR calc Af Amer: 76 mL/min/{1.73_m2} (ref >59)
GFR calc non Af Amer: 66 mL/min/{1.73_m2} (ref >59)
Globulin, Total: 2.4 g/dL (ref 1.5–4.5)
Glucose: 83 mg/dL (ref 65–99)
Potassium: 4.2 mmol/L (ref 3.5–5.2)
Sodium: 144 mmol/L (ref 134–144)
Total Protein: 7 g/dL (ref 6.0–8.5)

## 2019-06-14 LAB — LIPID PANEL
Chol/HDL Ratio: 1.8 ratio (ref 0.0–4.4)
Cholesterol, Total: 158 mg/dL (ref 100–199)
HDL: 89 mg/dL (ref >39)
LDL Calculated: 56 mg/dL (ref 0–99)
Triglycerides: 67 mg/dL (ref 0–149)
VLDL Cholesterol Cal: 13 mg/dL (ref 5–40)

## 2019-06-14 LAB — CBC
Hematocrit: 42 % (ref 34.0–46.6)
Hemoglobin: 14.6 g/dL (ref 11.1–15.9)
MCH: 32.3 pg (ref 26.6–33.0)
MCHC: 34.8 g/dL (ref 31.5–35.7)
MCV: 93 fL (ref 79–97)
Platelets: 233 10*3/uL (ref 150–450)
RBC: 4.52 x10E6/uL (ref 3.77–5.28)
RDW: 12.3 % (ref 11.7–15.4)
WBC: 6 10*3/uL (ref 3.4–10.8)

## 2019-06-14 LAB — TSH: TSH: 1.73 u[IU]/mL (ref 0.450–4.500)

## 2019-06-16 MED ORDER — ROSUVASTATIN CALCIUM 5 MG PO TABS: 5.0000 mg | ORAL_TABLET | Freq: Every day | ORAL | 6 refills | Status: DC

## 2019-06-18 DIAGNOSIS — R9082 White matter disease, unspecified: Secondary | ICD-10-CM | POA: Diagnosis not present

## 2019-06-18 DIAGNOSIS — I6782 Cerebral ischemia: Secondary | ICD-10-CM | POA: Diagnosis not present

## 2019-06-18 DIAGNOSIS — H532 Diplopia: Secondary | ICD-10-CM | POA: Diagnosis not present

## 2019-06-20 DIAGNOSIS — D2261 Melanocytic nevi of right upper limb, including shoulder: Secondary | ICD-10-CM | POA: Diagnosis not present

## 2019-06-20 DIAGNOSIS — Z85828 Personal history of other malignant neoplasm of skin: Secondary | ICD-10-CM | POA: Diagnosis not present

## 2019-06-20 DIAGNOSIS — L821 Other seborrheic keratosis: Secondary | ICD-10-CM | POA: Diagnosis not present

## 2019-06-20 DIAGNOSIS — L570 Actinic keratosis: Secondary | ICD-10-CM | POA: Diagnosis not present

## 2019-06-20 DIAGNOSIS — D2272 Melanocytic nevi of left lower limb, including hip: Secondary | ICD-10-CM | POA: Diagnosis not present

## 2019-06-20 DIAGNOSIS — D225 Melanocytic nevi of trunk: Secondary | ICD-10-CM | POA: Diagnosis not present

## 2019-06-20 DIAGNOSIS — H532 Diplopia: Secondary | ICD-10-CM | POA: Diagnosis not present

## 2019-06-20 DIAGNOSIS — D1801 Hemangioma of skin and subcutaneous tissue: Secondary | ICD-10-CM | POA: Diagnosis not present

## 2019-06-20 DIAGNOSIS — L817 Pigmented purpuric dermatosis: Secondary | ICD-10-CM | POA: Diagnosis not present

## 2019-06-20 DIAGNOSIS — D485 Neoplasm of uncertain behavior of skin: Secondary | ICD-10-CM | POA: Diagnosis not present

## 2019-06-20 DIAGNOSIS — Z85820 Personal history of malignant melanoma of skin: Secondary | ICD-10-CM | POA: Diagnosis not present

## 2019-07-14 ENCOUNTER — Other Ambulatory Visit: Payer: Self-pay

## 2019-07-14 ENCOUNTER — Ambulatory Visit (INDEPENDENT_AMBULATORY_CARE_PROVIDER_SITE_OTHER): Payer: Medicare Other | Admitting: *Deleted

## 2019-07-14 DIAGNOSIS — I4891 Unspecified atrial fibrillation: Secondary | ICD-10-CM | POA: Diagnosis not present

## 2019-07-14 DIAGNOSIS — Z7902 Long term (current) use of antithrombotics/antiplatelets: Secondary | ICD-10-CM | POA: Diagnosis not present

## 2019-07-14 DIAGNOSIS — Z5181 Encounter for therapeutic drug level monitoring: Secondary | ICD-10-CM

## 2019-07-14 LAB — POCT INR: INR: 2.8 (ref 2.0–3.0)

## 2019-07-14 NOTE — Patient Instructions (Signed)
Continue coumadin 3mg  daily except 6mg  on Sundays, Tuesdays and Thursdays  Continue greens Recheck in 6 weeks

## 2019-08-25 ENCOUNTER — Ambulatory Visit (INDEPENDENT_AMBULATORY_CARE_PROVIDER_SITE_OTHER): Payer: Medicare Other | Admitting: *Deleted

## 2019-08-25 ENCOUNTER — Other Ambulatory Visit: Payer: Self-pay

## 2019-08-25 DIAGNOSIS — Z5181 Encounter for therapeutic drug level monitoring: Secondary | ICD-10-CM

## 2019-08-25 DIAGNOSIS — I4891 Unspecified atrial fibrillation: Secondary | ICD-10-CM

## 2019-08-25 LAB — POCT INR: INR: 3.1 — AB (ref 2.0–3.0)

## 2019-08-25 NOTE — Patient Instructions (Signed)
Continue coumadin 3mg daily except 6mg on Sundays, Tuesdays and Thursdays  Continue greens Recheck in 6 weeks 

## 2019-08-27 DIAGNOSIS — Z6820 Body mass index (BMI) 20.0-20.9, adult: Secondary | ICD-10-CM | POA: Diagnosis not present

## 2019-08-27 DIAGNOSIS — E039 Hypothyroidism, unspecified: Secondary | ICD-10-CM | POA: Diagnosis not present

## 2019-08-27 DIAGNOSIS — Z1389 Encounter for screening for other disorder: Secondary | ICD-10-CM | POA: Diagnosis not present

## 2019-08-29 DIAGNOSIS — Z23 Encounter for immunization: Secondary | ICD-10-CM | POA: Diagnosis not present

## 2019-09-09 ENCOUNTER — Other Ambulatory Visit: Payer: Self-pay | Admitting: Internal Medicine

## 2019-09-13 ENCOUNTER — Other Ambulatory Visit: Payer: Self-pay | Admitting: Internal Medicine

## 2019-09-18 ENCOUNTER — Other Ambulatory Visit: Payer: Self-pay | Admitting: Internal Medicine

## 2019-10-06 ENCOUNTER — Other Ambulatory Visit: Payer: Self-pay

## 2019-10-06 ENCOUNTER — Ambulatory Visit (INDEPENDENT_AMBULATORY_CARE_PROVIDER_SITE_OTHER): Payer: Medicare Other | Admitting: *Deleted

## 2019-10-06 DIAGNOSIS — I4891 Unspecified atrial fibrillation: Secondary | ICD-10-CM

## 2019-10-06 DIAGNOSIS — Z5181 Encounter for therapeutic drug level monitoring: Secondary | ICD-10-CM | POA: Diagnosis not present

## 2019-10-06 LAB — POCT INR: INR: 3 (ref 2.0–3.0)

## 2019-10-06 NOTE — Patient Instructions (Signed)
Continue coumadin 3mg daily except 6mg on Sundays, Tuesdays and Thursdays  Continue greens Recheck in 6 weeks 

## 2019-10-15 ENCOUNTER — Other Ambulatory Visit: Payer: Self-pay | Admitting: *Deleted

## 2019-10-15 DIAGNOSIS — Z20822 Contact with and (suspected) exposure to covid-19: Secondary | ICD-10-CM

## 2019-10-15 DIAGNOSIS — Z20828 Contact with and (suspected) exposure to other viral communicable diseases: Secondary | ICD-10-CM | POA: Diagnosis not present

## 2019-10-21 LAB — NOVEL CORONAVIRUS, NAA: SARS-CoV-2, NAA: DETECTED — AB

## 2019-11-17 ENCOUNTER — Other Ambulatory Visit: Payer: Self-pay

## 2019-11-17 ENCOUNTER — Ambulatory Visit (INDEPENDENT_AMBULATORY_CARE_PROVIDER_SITE_OTHER): Payer: Medicare Other | Admitting: *Deleted

## 2019-11-17 DIAGNOSIS — I4891 Unspecified atrial fibrillation: Secondary | ICD-10-CM | POA: Diagnosis not present

## 2019-11-17 DIAGNOSIS — Z5181 Encounter for therapeutic drug level monitoring: Secondary | ICD-10-CM

## 2019-11-17 LAB — POCT INR: INR: 3.3 — AB (ref 2.0–3.0)

## 2019-11-17 NOTE — Patient Instructions (Signed)
Hold coumadin tonight then decrease dose to 3mg  daily except 6mg  Saturday and Thursdays  Continue greens Recheck in 6 weeks

## 2019-12-08 ENCOUNTER — Other Ambulatory Visit: Payer: Self-pay | Admitting: Internal Medicine

## 2019-12-17 ENCOUNTER — Other Ambulatory Visit: Payer: Self-pay | Admitting: Internal Medicine

## 2019-12-18 DIAGNOSIS — Z23 Encounter for immunization: Secondary | ICD-10-CM | POA: Diagnosis not present

## 2019-12-22 ENCOUNTER — Ambulatory Visit (INDEPENDENT_AMBULATORY_CARE_PROVIDER_SITE_OTHER): Payer: Medicare Other | Admitting: *Deleted

## 2019-12-22 ENCOUNTER — Other Ambulatory Visit: Payer: Self-pay

## 2019-12-22 DIAGNOSIS — Z5181 Encounter for therapeutic drug level monitoring: Secondary | ICD-10-CM

## 2019-12-22 DIAGNOSIS — I4891 Unspecified atrial fibrillation: Secondary | ICD-10-CM

## 2019-12-22 LAB — POCT INR: INR: 2.2 (ref 2.0–3.0)

## 2019-12-22 NOTE — Patient Instructions (Signed)
Continue warfarin 3mg daily except 6mg Sundays and Thursdays  Continue greens Recheck in 6 weeks 

## 2020-01-04 DIAGNOSIS — E038 Other specified hypothyroidism: Secondary | ICD-10-CM | POA: Diagnosis not present

## 2020-01-04 DIAGNOSIS — M810 Age-related osteoporosis without current pathological fracture: Secondary | ICD-10-CM | POA: Diagnosis not present

## 2020-01-04 DIAGNOSIS — I10 Essential (primary) hypertension: Secondary | ICD-10-CM | POA: Diagnosis not present

## 2020-01-04 DIAGNOSIS — I4821 Permanent atrial fibrillation: Secondary | ICD-10-CM | POA: Diagnosis not present

## 2020-01-08 DIAGNOSIS — Z20822 Contact with and (suspected) exposure to covid-19: Secondary | ICD-10-CM | POA: Diagnosis not present

## 2020-01-12 ENCOUNTER — Other Ambulatory Visit: Payer: Self-pay | Admitting: Internal Medicine

## 2020-01-15 DIAGNOSIS — Z23 Encounter for immunization: Secondary | ICD-10-CM | POA: Diagnosis not present

## 2020-02-01 DIAGNOSIS — E038 Other specified hypothyroidism: Secondary | ICD-10-CM | POA: Diagnosis not present

## 2020-02-01 DIAGNOSIS — M810 Age-related osteoporosis without current pathological fracture: Secondary | ICD-10-CM | POA: Diagnosis not present

## 2020-02-01 DIAGNOSIS — I10 Essential (primary) hypertension: Secondary | ICD-10-CM | POA: Diagnosis not present

## 2020-02-01 DIAGNOSIS — I4821 Permanent atrial fibrillation: Secondary | ICD-10-CM | POA: Diagnosis not present

## 2020-02-02 ENCOUNTER — Other Ambulatory Visit: Payer: Self-pay

## 2020-02-02 ENCOUNTER — Ambulatory Visit (INDEPENDENT_AMBULATORY_CARE_PROVIDER_SITE_OTHER): Payer: Medicare Other | Admitting: *Deleted

## 2020-02-02 DIAGNOSIS — I4891 Unspecified atrial fibrillation: Secondary | ICD-10-CM

## 2020-02-02 DIAGNOSIS — Z5181 Encounter for therapeutic drug level monitoring: Secondary | ICD-10-CM | POA: Diagnosis not present

## 2020-02-02 LAB — POCT INR: INR: 2.1 (ref 2.0–3.0)

## 2020-02-02 NOTE — Patient Instructions (Signed)
Continue warfarin 3mg daily except 6mg Sundays and Thursdays  Continue greens Recheck in 6 weeks 

## 2020-03-03 DIAGNOSIS — M810 Age-related osteoporosis without current pathological fracture: Secondary | ICD-10-CM | POA: Diagnosis not present

## 2020-03-03 DIAGNOSIS — E038 Other specified hypothyroidism: Secondary | ICD-10-CM | POA: Diagnosis not present

## 2020-03-03 DIAGNOSIS — I4821 Permanent atrial fibrillation: Secondary | ICD-10-CM | POA: Diagnosis not present

## 2020-03-03 DIAGNOSIS — I10 Essential (primary) hypertension: Secondary | ICD-10-CM | POA: Diagnosis not present

## 2020-03-06 ENCOUNTER — Other Ambulatory Visit: Payer: Self-pay | Admitting: Internal Medicine

## 2020-03-10 DIAGNOSIS — H5032 Intermittent alternating esotropia: Secondary | ICD-10-CM | POA: Diagnosis not present

## 2020-03-10 DIAGNOSIS — H5021 Vertical strabismus, right eye: Secondary | ICD-10-CM | POA: Diagnosis not present

## 2020-03-15 ENCOUNTER — Other Ambulatory Visit: Payer: Self-pay

## 2020-03-15 ENCOUNTER — Ambulatory Visit (INDEPENDENT_AMBULATORY_CARE_PROVIDER_SITE_OTHER): Payer: Medicare Other | Admitting: *Deleted

## 2020-03-15 ENCOUNTER — Other Ambulatory Visit: Payer: Self-pay | Admitting: Internal Medicine

## 2020-03-15 DIAGNOSIS — Z5181 Encounter for therapeutic drug level monitoring: Secondary | ICD-10-CM

## 2020-03-15 DIAGNOSIS — I4891 Unspecified atrial fibrillation: Secondary | ICD-10-CM

## 2020-03-15 LAB — POCT INR: INR: 2 (ref 2.0–3.0)

## 2020-03-15 NOTE — Patient Instructions (Signed)
Continue warfarin 3mg daily except 6mg Sundays and Thursdays  Continue greens Recheck in 6 weeks 

## 2020-04-02 DIAGNOSIS — M810 Age-related osteoporosis without current pathological fracture: Secondary | ICD-10-CM | POA: Diagnosis not present

## 2020-04-02 DIAGNOSIS — I10 Essential (primary) hypertension: Secondary | ICD-10-CM | POA: Diagnosis not present

## 2020-04-02 DIAGNOSIS — E038 Other specified hypothyroidism: Secondary | ICD-10-CM | POA: Diagnosis not present

## 2020-04-02 DIAGNOSIS — I4821 Permanent atrial fibrillation: Secondary | ICD-10-CM | POA: Diagnosis not present

## 2020-04-26 ENCOUNTER — Ambulatory Visit (INDEPENDENT_AMBULATORY_CARE_PROVIDER_SITE_OTHER): Payer: Medicare Other | Admitting: *Deleted

## 2020-04-26 ENCOUNTER — Other Ambulatory Visit: Payer: Self-pay

## 2020-04-26 DIAGNOSIS — Z5181 Encounter for therapeutic drug level monitoring: Secondary | ICD-10-CM

## 2020-04-26 DIAGNOSIS — I4891 Unspecified atrial fibrillation: Secondary | ICD-10-CM | POA: Diagnosis not present

## 2020-04-26 LAB — POCT INR: INR: 2.3 (ref 2.0–3.0)

## 2020-04-26 NOTE — Patient Instructions (Signed)
Continue warfarin 3mg daily except 6mg Sundays and Thursdays  Continue greens Recheck in 6 weeks 

## 2020-05-03 ENCOUNTER — Other Ambulatory Visit: Payer: Self-pay | Admitting: Internal Medicine

## 2020-05-11 DIAGNOSIS — H532 Diplopia: Secondary | ICD-10-CM | POA: Diagnosis not present

## 2020-05-11 DIAGNOSIS — H52203 Unspecified astigmatism, bilateral: Secondary | ICD-10-CM | POA: Diagnosis not present

## 2020-05-11 DIAGNOSIS — Z961 Presence of intraocular lens: Secondary | ICD-10-CM | POA: Diagnosis not present

## 2020-05-11 DIAGNOSIS — H26493 Other secondary cataract, bilateral: Secondary | ICD-10-CM | POA: Diagnosis not present

## 2020-05-13 ENCOUNTER — Encounter: Payer: Self-pay | Admitting: Internal Medicine

## 2020-05-13 ENCOUNTER — Ambulatory Visit (INDEPENDENT_AMBULATORY_CARE_PROVIDER_SITE_OTHER): Payer: Medicare Other | Admitting: Internal Medicine

## 2020-05-13 ENCOUNTER — Other Ambulatory Visit: Payer: Self-pay

## 2020-05-13 VITALS — BP 130/70 | HR 65 | Ht 63.0 in | Wt 117.0 lb

## 2020-05-13 DIAGNOSIS — E785 Hyperlipidemia, unspecified: Secondary | ICD-10-CM

## 2020-05-13 DIAGNOSIS — I351 Nonrheumatic aortic (valve) insufficiency: Secondary | ICD-10-CM | POA: Diagnosis not present

## 2020-05-13 DIAGNOSIS — I4891 Unspecified atrial fibrillation: Secondary | ICD-10-CM | POA: Diagnosis not present

## 2020-05-13 DIAGNOSIS — I340 Nonrheumatic mitral (valve) insufficiency: Secondary | ICD-10-CM

## 2020-05-13 DIAGNOSIS — Z79899 Other long term (current) drug therapy: Secondary | ICD-10-CM

## 2020-05-13 NOTE — Patient Instructions (Signed)
Medication Instructions:  Your physician recommends that you continue on your current medications as directed. Please refer to the Current Medication list given to you today.  *If you need a refill on your cardiac medications before your next appointment, please call your pharmacy*   Lab Work: FASTING lipid panel, CMET, CBC If you have labs (blood work) drawn today and your tests are completely normal, you will receive your results only by: Marland Kitchen MyChart Message (if you have MyChart) OR . A paper copy in the mail If you have any lab test that is abnormal or we need to change your treatment, we will call you to review the results.   Testing/Procedures: Your physician has requested that you have an echocardiogram. Echocardiography is a painless test that uses sound waves to create images of your heart. It provides your doctor with information about the size and shape of your heart and how well your heart's chambers and valves are working. This procedure takes approximately one hour. There are no restrictions for this procedure.   Follow-Up: At [facility], you and your health needs are our priority.  As part of our continuing mission to provide you with exceptional heart care, we have created designated Provider Care Teams.  These Care Teams include your primary Cardiologist (physician) and Advanced Practice Providers (APPs -  Physician Assistants and Nurse Practitioners) who all work together to provide you with the care you need, when you need it.  We recommend signing up for the patient portal called "MyChart".  Sign up information is provided on this After Visit Summary.  MyChart is used to connect with patients for Virtual Visits (Telemedicine).  Patients are able to view lab/test results, encounter notes, upcoming appointments, etc.  Non-urgent messages can be sent to your provider as well.   To learn more about what you can do with MyChart, go to [email or web address].    Your next  appointment:   12 month(s)  The format for your next appointment:   In Person  Provider:   You may see Dr. Debara Pickett or one of the following Advanced Practice Providers on your designated Care Team:    Almyra Deforest, PA-C  Fabian Sharp, [place] or   Roby Lofts, [place]    Other Instructions

## 2020-05-13 NOTE — Progress Notes (Signed)
OFFICE NOTE  Chief Complaint: Routine follow-up  Primary Care Physician: Sharilyn Sites, MD  HPI:  Kathleen Mcconnell is a 79 year old female with a history of paroxysmal atrial fibrillation, on sotalol, who had a cardioversion in 2004 and had not had recurrence until recently. She also has signs of rheumatic heart disease on her echocardiogram with some sclerosis of the aortic valve, and moderate to severe TR and mild MR with a flat coaptation on the mitral valve. In addition, her EF is greater than 55% and she has normal atrial sizes. Recently, she had presented to the office for an annual followup and was found to be in atrial fibrillation with controlled ventricular response. She was clearly unaware that she was in atrial fibrillation; however, she did note that she was getting a little more short of breath when walking up stairs and had been a little bit more fatigued recently. She has been maintained on warfarin and has done fairly well with maintaining a therapeutic INR. She is also on diltiazem additionally for rate control and also for hypertension. I, therefore, recommended increasing her sotalol to 120 mg twice daily and set her up for a cardioversion. She underwent cardioversion on November 19, 2012, with a single 150-joule biphasic shock. She converted to a sinus rhythm and afterwards felt very well. This persisted for several weeks until recently; she started to feel tired again, not nearly as good as shortly after the procedure was performed. On followup visit, she was noted again to be in atrial fibrillation, now with slow ventricular response at a rate of 56. She claimed to be fairly symptomatic and therefore refer her to see Dr. Rayann Heman with cardiac electrophysiology. He evaluated her and felt that due to her severe tricuspid regurgitation, she was not a good candidate for atrial fibrillation ablation, as well as the fact that she is not symptomatic.  He also recommended discontinuing  sotalol and pursuing rate control. She has since transitioned over to her warfarin checks with [facility] in [place].  Kathleen Mcconnell returns today for follow-up. She is noted to be in A. fib with mild rapid ventricular response today and rates in the low 100s. There is a question about her being on both ACE and ARB which was started over 10 years ago. Although this is not current practice I have continued on this medicine because he seems to be doing well, however it is somewhat redundant. She denies any chest pain or worsening shortness of breath. I do believe she could have better rate control.  I saw [name] back today in the office for follow-up. Overall she's feeling well. She remains rate controlled in permanent A. fib. Her INRs have been therapeutic except for one short period this summer. Apparently she was seen in the ER for what was thought to be a TIA event. Her INR was slightly low however cardio workup was negative. She ultimately was reestablished to a therapeutic level of warfarin and has done well since then.  07/13/2016  Kathleen Mcconnell returns today for follow-up. She seems to be doing fairly well. She's had no further TIA events. She is accompanied by her friend today who reports that she has noted that Mrs. Heitzenrater has had more shortness of breath recently. Particularly when walking up stairs. She's also had lower extremity swelling and some discoloration of her ankles. She is concerned about heart failure. Blood pressure is mildly elevated today however she found her dog dead this morning. Understandably her blood pressure would  be elevated. She denies any chest pain.  7/35/3299  Kathleen Mcconnell returns today for follow-up. She reports her swelling has significantly improved, however, she never got her compression stockings. Echo shows EF 55-60% with severe biatrial enlargement (maybe consistent with permanent a-fib) and mild to moderate MR and moderate TR with RVSP of 33 mmHg. Although she has been  compliant with warfarin and INR has been fairly well managed, it is likely that she will have greater benefit and lower bleeding risk on DOAC such as Eliquis. We discussed switching today. She was previously on Xarelto before warfarin, however, thought she may have had side-effects. Turned out not to be the medication.  2/42/6834  Kathleen Mcconnell seen today in follow-up. Overall she's had improvement in swelling. She's taking her Lasix only about 4 days a week. She reported that she felt dizzy or unwell on Eliquis. Is not quite clear but she took it for 3 months and then transition back to warfarin. Unfortunately in January this year she had a UTI and was placed on Bactrim which significantly elevated her INR to almost 15. Subsequent to that she was referred to the hospital but did not have any significant or life-threatening bleeding. INR since been fairly well-regulated and it was 3.2 on March 14. Blood pressure is well-controlled today 124/76. She reports persistent shortness of breath which comes and goes when walking up stairs, something she does on a daily basis.  12/12/6220  Kathleen Mcconnell is seen today in follow-up.  She denies any chest pain or worsening shortness of breath.  Her A. fib rate is controlled today.  Recently she is been very stable with her INR.  She says she has not had blood work possibly in the last year.    9/79/8921  Kathleen Mcconnell is seen today in follow-up.  This is a routine annual visit.  She denies any chest pain or worsening shortness of breath.  She is in persistent A. fib but is rate controlled and unaware of it.  Her INRs have been stable being checked every 4 to 6 weeks.  She is not had any recent blood work or follow-up with her PCP.  Blood pressure was slightly elevated today however reportedly is better at home.  1/94/1740  Kathleen Mcconnell returns today for follow-up.  She continues to do well without any symptoms.  She is not had any worsening shortness of breath and notes that her lower extremity  edema is essentially resolved.  She has not needed additional Lasix.  Her last echo was in 2017 which did show some mild to moderate MR and moderate TR but this has not been reassessed.  She has had therapeutic INRs.  She has not had any significant lab work except for check a TSH by her PCP.  PMHx:  Past Medical History:  Diagnosis Date  . History of nuclear stress test 03/2012   lexiscan; normal pattern of perfusion; normal, low risk   . Hyperlipidemia   . Hypertension   . Hypothyroidism   . Mild aortic insufficiency   . Mitral valve regurgitation   . Persistent atrial fibrillation ([place])    History of; cardioversion in 2004  . Severe tricuspid regurgitation    on echo 02/2011 ?rheumatic heart disease  . Thyroid mass 10/10/2001   benign lesion removed    Past Surgical History:  Procedure Laterality Date  . CARDIAC CATHETERIZATION  8144   normal systolic function, near normal coronaries   . CARDIOVERSION N/A 01/20/2013   Procedure: CARDIOVERSION;  Surgeon: Pixie Casino,  MD;  Location: [facility];  Service: Cardiovascular;  Laterality: N/A;  . THYROIDECTOMY  10/10/2001  . TRANSTHORACIC ECHOCARDIOGRAM  02/2011   EF=>55%; mild MR; mod-severe TR & elevated RV systolic pressure, mild pulm HTN; aortic valve mildly sclerotic with mild regurg    FAMHx:  Family History  Problem Relation Age of Onset  . Heart disease Father        also MI  . Heart disease Brother        x 3, one deceased age 55  . Heart disease Sister     SOCHx:   reports that she has never smoked. She has never used smokeless tobacco. She reports that she does not drink alcohol and does not use drugs.  ALLERGIES:  Allergies  Allergen Reactions  . Aspirin Anaphylaxis, Hives, Rash and Other (See Comments)    As well as short of breath; Previously was rushed to hospital-given epinephrine injection.     ROS: Pertinent items noted in HPI and remainder of comprehensive ROS otherwise negative.  HOME  MEDS: Current Outpatient Medications  Medication Sig Dispense Refill  . acetaminophen (TYLENOL) 500 MG tablet Take 1,000 mg by mouth every 6 (six) hours as needed. For pain    . B Complex Vitamins (B COMPLEX 1 PO) Take 1 tablet by mouth daily.    Marland Kitchen CALCIUM-VITAMIN D PO Take 1 tablet by mouth 2 (two) times daily.    Marland Kitchen diltiazem (CARDIZEM CD) 240 MG 24 hr capsule TAKE (1) CAPSULE BY MOUTH ONCE DAILY. 90 capsule 0  . fish oil-omega-3 fatty acids 1000 MG capsule Take 1 g by mouth daily.    . furosemide (LASIX) 20 MG tablet Take 1 tablet (20 mg total) by mouth daily as needed for fluid or edema. 30 tablet 3  . levothyroxine (SYNTHROID, LEVOTHROID) 88 MCG tablet Take 88 mcg by mouth daily.    Marland Kitchen lisinopril (ZESTRIL) 20 MG tablet TAKE (1) TABLET BY MOUTH DAILY. 90 tablet 0  . metoprolol succinate (TOPROL-XL) 50 MG 24 hr tablet TAKE ONE TABLET BY MOUTH DAILY. 90 tablet 0  . Multiple Vitamin (MULTIVITAMIN WITH MINERALS) TABS Take 1 tablet by mouth daily.    . rosuvastatin (CRESTOR) 5 MG tablet TAKE ONE TABLET BY MOUTH DAILY. 90 tablet 0  . warfarin (COUMADIN) 6 MG tablet TAKE 1 TABLET DAILY EXCEPT 1/2 TABLET ON MONDAYS, WEDNESDAYS, AND FRIDAYS. 72 tablet 0   No current facility-administered medications for this visit.    LABS/IMAGING: No results found for this or any previous visit (from the past 48 hour(s)). No results found.  VITALS: BP 130/70   Pulse 65   Ht 5\' 3"  (1.6 m)   Wt 117 lb (53.1 kg)   SpO2 98%   BMI 20.73 kg/m   EXAM: General appearance: alert and no distress Neck: no carotid bruit, no JVD and thyroid not enlarged, symmetric, no tenderness/mass/nodules Lungs: clear to auscultation bilaterally Heart: irregularly irregular rhythm Abdomen: soft, non-tender; bowel sounds normal; no masses,  no organomegaly Extremities: extremities normal, atraumatic, no cyanosis or edema and venous stasis dermatitis noted Pulses: 2+ and symmetric Skin: Skin color, texture, turgor normal. No  rashes or lesions Neurologic: Grossly normal Psych: Pleasant  EKG: A. fib at 63-personally reviewed  ASSESSMENT: 1. DOE-  Echo with normal LV function, severe biatrial enlargement - mild to moderate MR, moderate TR (2017) 2. Lower extremity edema 3. Permanent atrial fibrillation-asymptomatic 4. Hypertension-controlled 5. Long-term anticoagulation on warfarin 6. History of TIA  PLAN: 1.   Mrs. Liane Comber  seems to be stable without any worsening shortness of breath or edema.  Her last echo in 2017 showed normal LV function with mild to moderate MR moderate TR.  I like to repeat the study to see if she has had stable valve disease or not.  A. fib is permanent and rate controlled.  She prefers to stay on warfarin and INRs have been therapeutic.  Blood pressure is well controlled.  We will repeat our annual labs including a lipid profile, comprehensive metabolic profile and CBC since she is anticoagulated.  TSH was checked by her PCP.  Plan follow-up with me annually or sooner as necessary.  Pixie Casino, MD, [facility], [place] Director of the [facility]  [facility] Diplomate of the American Board of Clinical Lipidology Attending Cardiologist  Direct Dial: [phone]  Fax: [phone]  Website:  [email or web address] 05/13/2020, 1:23 PM

## 2020-05-18 DIAGNOSIS — E785 Hyperlipidemia, unspecified: Secondary | ICD-10-CM | POA: Diagnosis not present

## 2020-05-18 DIAGNOSIS — Z79899 Other long term (current) drug therapy: Secondary | ICD-10-CM | POA: Diagnosis not present

## 2020-05-19 LAB — LIPID PANEL
Chol/HDL Ratio: 2.1 ratio (ref 0.0–4.4)
Cholesterol, Total: 167 mg/dL (ref 100–199)
HDL: 79 mg/dL (ref >39)
LDL Chol Calc (NIH): 73 mg/dL (ref 0–99)
Triglycerides: 83 mg/dL (ref 0–149)
VLDL Cholesterol Cal: 15 mg/dL (ref 5–40)

## 2020-05-19 LAB — COMPREHENSIVE METABOLIC PANEL
ALT: 23 IU/L (ref 0–32)
AST: 46 IU/L — ABNORMAL HIGH (ref 0–40)
Albumin/Globulin Ratio: 1.9 (ref 1.2–2.2)
Albumin: 4.6 g/dL (ref 3.7–4.7)
Alkaline Phosphatase: 68 IU/L (ref 48–121)
BUN/Creatinine Ratio: 14 (ref 12–28)
BUN: 11 mg/dL (ref 8–27)
Bilirubin Total: 0.8 mg/dL (ref 0.0–1.2)
CO2: 22 mmol/L (ref 20–29)
Calcium: 9.7 mg/dL (ref 8.7–10.3)
Chloride: 105 mmol/L (ref 96–106)
Creatinine, Ser: 0.8 mg/dL (ref 0.57–1.00)
GFR calc Af Amer: 81 mL/min/{1.73_m2} (ref >59)
GFR calc non Af Amer: 70 mL/min/{1.73_m2} (ref >59)
Globulin, Total: 2.4 g/dL (ref 1.5–4.5)
Glucose: 88 mg/dL (ref 65–99)
Potassium: 4.4 mmol/L (ref 3.5–5.2)
Sodium: 143 mmol/L (ref 134–144)
Total Protein: 7 g/dL (ref 6.0–8.5)

## 2020-05-19 LAB — CBC
Hematocrit: 42 % (ref 34.0–46.6)
Hemoglobin: 14.5 g/dL (ref 11.1–15.9)
MCH: 32.9 pg (ref 26.6–33.0)
MCHC: 34.5 g/dL (ref 31.5–35.7)
MCV: 95 fL (ref 79–97)
Platelets: 208 10*3/uL (ref 150–450)
RBC: 4.41 x10E6/uL (ref 3.77–5.28)
RDW: 12.6 % (ref 11.7–15.4)
WBC: 5.7 10*3/uL (ref 3.4–10.8)

## 2020-05-20 ENCOUNTER — Other Ambulatory Visit: Payer: Self-pay

## 2020-05-20 ENCOUNTER — Ambulatory Visit (HOSPITAL_COMMUNITY)
Admission: RE | Admit: 2020-05-20 | Discharge: 2020-05-20 | Disposition: A | Discharge status: Home / Self Care | Payer: Medicare Other | Source: Ambulatory Visit | Attending: Internal Medicine | Admitting: Internal Medicine

## 2020-05-20 DIAGNOSIS — I340 Nonrheumatic mitral (valve) insufficiency: Secondary | ICD-10-CM | POA: Insufficient documentation

## 2020-05-20 DIAGNOSIS — I351 Nonrheumatic aortic (valve) insufficiency: Secondary | ICD-10-CM

## 2020-05-20 DIAGNOSIS — I4891 Unspecified atrial fibrillation: Secondary | ICD-10-CM

## 2020-05-20 NOTE — Progress Notes (Signed)
*  PRELIMINARY RESULTS* Echocardiogram 2D Echocardiogram has been performed.  Kathleen Mcconnell 05/20/2020, 4:01 PM

## 2020-05-24 ENCOUNTER — Other Ambulatory Visit: Payer: Self-pay | Admitting: *Deleted

## 2020-05-24 DIAGNOSIS — I340 Nonrheumatic mitral (valve) insufficiency: Secondary | ICD-10-CM

## 2020-06-01 ENCOUNTER — Other Ambulatory Visit: Payer: Self-pay | Admitting: Internal Medicine

## 2020-06-02 ENCOUNTER — Other Ambulatory Visit (HOSPITAL_COMMUNITY): Payer: Self-pay | Admitting: Family Medicine

## 2020-06-02 DIAGNOSIS — E2839 Other primary ovarian failure: Secondary | ICD-10-CM

## 2020-06-08 ENCOUNTER — Other Ambulatory Visit: Payer: Self-pay

## 2020-06-08 ENCOUNTER — Ambulatory Visit (INDEPENDENT_AMBULATORY_CARE_PROVIDER_SITE_OTHER): Payer: Medicare Other | Admitting: *Deleted

## 2020-06-08 DIAGNOSIS — Z5181 Encounter for therapeutic drug level monitoring: Secondary | ICD-10-CM

## 2020-06-08 DIAGNOSIS — I4891 Unspecified atrial fibrillation: Secondary | ICD-10-CM | POA: Diagnosis not present

## 2020-06-08 LAB — POCT INR: INR: 2.7 (ref 2.0–3.0)

## 2020-06-08 NOTE — Patient Instructions (Signed)
Continue warfarin 3mg daily except 6mg Sundays and Thursdays  Continue greens Recheck in 6 weeks 

## 2020-06-10 ENCOUNTER — Ambulatory Visit (HOSPITAL_COMMUNITY)
Admission: RE | Admit: 2020-06-10 | Discharge: 2020-06-10 | Disposition: A | Discharge status: Home / Self Care | Payer: Medicare Other | Source: Ambulatory Visit | Attending: Family Medicine | Admitting: Family Medicine

## 2020-06-10 ENCOUNTER — Other Ambulatory Visit: Payer: Self-pay

## 2020-06-10 DIAGNOSIS — E2839 Other primary ovarian failure: Secondary | ICD-10-CM | POA: Insufficient documentation

## 2020-06-10 DIAGNOSIS — M810 Age-related osteoporosis without current pathological fracture: Secondary | ICD-10-CM | POA: Diagnosis not present

## 2020-06-14 ENCOUNTER — Other Ambulatory Visit: Payer: Self-pay | Admitting: Internal Medicine

## 2020-06-29 DIAGNOSIS — L817 Pigmented purpuric dermatosis: Secondary | ICD-10-CM | POA: Diagnosis not present

## 2020-06-29 DIAGNOSIS — D2271 Melanocytic nevi of right lower limb, including hip: Secondary | ICD-10-CM | POA: Diagnosis not present

## 2020-06-29 DIAGNOSIS — L821 Other seborrheic keratosis: Secondary | ICD-10-CM | POA: Diagnosis not present

## 2020-06-29 DIAGNOSIS — Z85820 Personal history of malignant melanoma of skin: Secondary | ICD-10-CM | POA: Diagnosis not present

## 2020-06-29 DIAGNOSIS — Z85828 Personal history of other malignant neoplasm of skin: Secondary | ICD-10-CM | POA: Diagnosis not present

## 2020-06-29 DIAGNOSIS — L570 Actinic keratosis: Secondary | ICD-10-CM | POA: Diagnosis not present

## 2020-06-29 DIAGNOSIS — D2261 Melanocytic nevi of right upper limb, including shoulder: Secondary | ICD-10-CM | POA: Diagnosis not present

## 2020-06-29 DIAGNOSIS — D2272 Melanocytic nevi of left lower limb, including hip: Secondary | ICD-10-CM | POA: Diagnosis not present

## 2020-06-29 DIAGNOSIS — D1801 Hemangioma of skin and subcutaneous tissue: Secondary | ICD-10-CM | POA: Diagnosis not present

## 2020-07-20 ENCOUNTER — Ambulatory Visit (INDEPENDENT_AMBULATORY_CARE_PROVIDER_SITE_OTHER): Payer: Medicare Other | Admitting: *Deleted

## 2020-07-20 DIAGNOSIS — Z5181 Encounter for therapeutic drug level monitoring: Secondary | ICD-10-CM

## 2020-07-20 DIAGNOSIS — I4891 Unspecified atrial fibrillation: Secondary | ICD-10-CM | POA: Diagnosis not present

## 2020-07-20 LAB — POCT INR: INR: 2.5 (ref 2.0–3.0)

## 2020-07-20 NOTE — Patient Instructions (Signed)
Continue warfarin 3mg daily except 6mg Sundays and Thursdays  Continue greens Recheck in 6 weeks 

## 2020-08-10 ENCOUNTER — Telehealth: Payer: Self-pay | Admitting: Internal Medicine

## 2020-08-10 MED ORDER — FUROSEMIDE 20 MG PO TABS: 20.0000 mg | ORAL_TABLET | Freq: Every day | ORAL | 3 refills | Status: DC | PRN

## 2020-08-10 NOTE — Telephone Encounter (Signed)
*  STAT* If patient is at the pharmacy, call can be transferred to refill team.   1. Which medications need to be refilled? (please list name of each medication and dose if known) furosemide 20 MG  2. Which pharmacy/location (including street and city if local pharmacy) is medication to be sent to?[place]   3. Do they need a 30 day or 90 day supply? Not sure

## 2020-08-17 ENCOUNTER — Other Ambulatory Visit: Payer: Self-pay | Admitting: Internal Medicine

## 2020-08-30 ENCOUNTER — Other Ambulatory Visit: Payer: Self-pay | Admitting: Internal Medicine

## 2020-09-01 ENCOUNTER — Ambulatory Visit (INDEPENDENT_AMBULATORY_CARE_PROVIDER_SITE_OTHER): Payer: Medicare Other | Admitting: *Deleted

## 2020-09-01 DIAGNOSIS — Z5181 Encounter for therapeutic drug level monitoring: Secondary | ICD-10-CM | POA: Diagnosis not present

## 2020-09-01 DIAGNOSIS — I4891 Unspecified atrial fibrillation: Secondary | ICD-10-CM | POA: Diagnosis not present

## 2020-09-01 LAB — POCT INR: INR: 2 (ref 2.0–3.0)

## 2020-09-01 NOTE — Patient Instructions (Signed)
Continue warfarin 3mg daily except 6mg Sundays and Thursdays  Continue greens Recheck in 6 weeks 

## 2020-09-20 DIAGNOSIS — Z23 Encounter for immunization: Secondary | ICD-10-CM | POA: Diagnosis not present

## 2020-10-07 LAB — HM MAMMOGRAPHY

## 2020-10-08 DIAGNOSIS — Z23 Encounter for immunization: Secondary | ICD-10-CM | POA: Diagnosis not present

## 2020-10-13 ENCOUNTER — Ambulatory Visit (INDEPENDENT_AMBULATORY_CARE_PROVIDER_SITE_OTHER): Payer: Medicare Other | Admitting: *Deleted

## 2020-10-13 ENCOUNTER — Other Ambulatory Visit: Payer: Self-pay

## 2020-10-13 DIAGNOSIS — Z5181 Encounter for therapeutic drug level monitoring: Secondary | ICD-10-CM | POA: Diagnosis not present

## 2020-10-13 DIAGNOSIS — I4891 Unspecified atrial fibrillation: Secondary | ICD-10-CM | POA: Diagnosis not present

## 2020-10-13 LAB — POCT INR: INR: 2.1 (ref 2.0–3.0)

## 2020-10-13 NOTE — Patient Instructions (Signed)
Continue warfarin 3mg  daily except 6mg  Sundays and Thursdays  Continue greens Recheck in 6 weeks

## 2020-11-10 ENCOUNTER — Other Ambulatory Visit: Payer: Self-pay

## 2020-11-10 ENCOUNTER — Ambulatory Visit (HOSPITAL_COMMUNITY)
Admission: RE | Admit: 2020-11-10 | Discharge: 2020-11-10 | Disposition: A | Discharge status: Home / Self Care | Payer: Medicare Other | Source: Ambulatory Visit | Attending: Internal Medicine | Admitting: Internal Medicine

## 2020-11-10 DIAGNOSIS — I361 Nonrheumatic tricuspid (valve) insufficiency: Secondary | ICD-10-CM

## 2020-11-10 DIAGNOSIS — I340 Nonrheumatic mitral (valve) insufficiency: Secondary | ICD-10-CM | POA: Diagnosis not present

## 2020-11-10 LAB — ECHOCARDIOGRAM COMPLETE
AR max vel: 1.31 cm2
AV Area VTI: 1.12 cm2
AV Area mean vel: 1.28 cm2
AV Mean grad: 2.7 mmHg
AV Peak grad: 5.1 mmHg
Ao pk vel: 1.12 m/s
Area-P 1/2: 8.25 cm2
MV M vel: 5.33 m/s
MV Peak grad: 113.6 mmHg
Radius: 0.5 cm
S' Lateral: 2.5 cm

## 2020-11-10 NOTE — Progress Notes (Signed)
*  PRELIMINARY RESULTS* Echocardiogram 2D Echocardiogram has been performed.  Kathleen Mcconnell 11/10/2020, 1:53 PM

## 2020-11-11 ENCOUNTER — Telehealth: Payer: Self-pay | Admitting: Internal Medicine

## 2020-11-11 DIAGNOSIS — R0602 Shortness of breath: Secondary | ICD-10-CM

## 2020-11-11 DIAGNOSIS — Z79899 Other long term (current) drug therapy: Secondary | ICD-10-CM

## 2020-11-11 NOTE — Telephone Encounter (Signed)
Spoke with patient. Patient wants her daughter to be called with next steps from Dr. Debara Pickett.   Daughter is -  Ronaldo Miyamoto phone number is [phone]   Will route to primary RN for review.

## 2020-11-11 NOTE — Telephone Encounter (Signed)
Pt called in returning Jonny Ruiz about her ECHO   Best number-  [phone]

## 2020-11-12 MED ORDER — FUROSEMIDE 20 MG PO TABS: 20.0000 mg | ORAL_TABLET | Freq: Every day | ORAL | 1 refills | Status: DC

## 2020-11-12 NOTE — Telephone Encounter (Signed)
Unable to reach patient at either # provided but spoke with daughter Lynelle Smoke as requested by patient. Relayed echo results & MD advice. She reports patient may be under stress from caring for her spouse also. Lasix refilled. Labs ordered. She will communicate to patient. MyChart message also sent

## 2020-11-12 NOTE — Telephone Encounter (Signed)
Pixie Casino, MD  Fidel Levy, RN Would recommend increasing the diuretic to see if that helps - says she takes 20 mg daily as needed - she should take daily if not already, or increase to 40 mg if she is already taking 20 mg daily. Check BMET and BNP in 1-2 weeks.   Dr Lemmie Evens

## 2020-11-24 ENCOUNTER — Ambulatory Visit (INDEPENDENT_AMBULATORY_CARE_PROVIDER_SITE_OTHER): Payer: Medicare Other | Admitting: *Deleted

## 2020-11-24 DIAGNOSIS — I4891 Unspecified atrial fibrillation: Secondary | ICD-10-CM

## 2020-11-24 DIAGNOSIS — Z5181 Encounter for therapeutic drug level monitoring: Secondary | ICD-10-CM | POA: Diagnosis not present

## 2020-11-24 LAB — POCT INR: INR: 1.8 — AB (ref 2.0–3.0)

## 2020-11-24 NOTE — Patient Instructions (Signed)
Increase warfarin to 3mg  daily except 6mg  Mondays, Wednesdays and Fridays  Continue greens Recheck in 4 weeks

## 2020-11-29 ENCOUNTER — Other Ambulatory Visit: Payer: Self-pay | Admitting: Internal Medicine

## 2020-12-01 DIAGNOSIS — R0602 Shortness of breath: Secondary | ICD-10-CM | POA: Diagnosis not present

## 2020-12-01 DIAGNOSIS — Z79899 Other long term (current) drug therapy: Secondary | ICD-10-CM | POA: Diagnosis not present

## 2020-12-02 LAB — BASIC METABOLIC PANEL
BUN/Creatinine Ratio: 24 (ref 12–28)
BUN: 17 mg/dL (ref 8–27)
CO2: 25 mmol/L (ref 20–29)
Calcium: 9.6 mg/dL (ref 8.7–10.3)
Chloride: 103 mmol/L (ref 96–106)
Creatinine, Ser: 0.72 mg/dL (ref 0.57–1.00)
GFR calc Af Amer: 91 mL/min/{1.73_m2} (ref >59)
GFR calc non Af Amer: 79 mL/min/{1.73_m2} (ref >59)
Glucose: 96 mg/dL (ref 65–99)
Potassium: 4 mmol/L (ref 3.5–5.2)
Sodium: 144 mmol/L (ref 134–144)

## 2020-12-02 LAB — BRAIN NATRIURETIC PEPTIDE: BNP: 281 pg/mL — ABNORMAL HIGH (ref 0.0–100.0)

## 2020-12-14 ENCOUNTER — Ambulatory Visit (HOSPITAL_COMMUNITY)
Admission: RE | Admit: 2020-12-14 | Discharge: 2020-12-14 | Disposition: A | Discharge status: Home / Self Care | Payer: Medicare Other | Source: Ambulatory Visit | Attending: Physician Assistant | Admitting: Physician Assistant

## 2020-12-14 ENCOUNTER — Other Ambulatory Visit: Payer: Self-pay

## 2020-12-14 ENCOUNTER — Other Ambulatory Visit (HOSPITAL_COMMUNITY): Payer: Self-pay | Admitting: Physician Assistant

## 2020-12-14 DIAGNOSIS — M5412 Radiculopathy, cervical region: Secondary | ICD-10-CM | POA: Insufficient documentation

## 2020-12-14 DIAGNOSIS — Z681 Body mass index (BMI) 19 or less, adult: Secondary | ICD-10-CM | POA: Diagnosis not present

## 2020-12-14 DIAGNOSIS — M50121 Cervical disc disorder at C4-C5 level with radiculopathy: Secondary | ICD-10-CM | POA: Diagnosis not present

## 2020-12-14 DIAGNOSIS — M50123 Cervical disc disorder at C6-C7 level with radiculopathy: Secondary | ICD-10-CM | POA: Diagnosis not present

## 2020-12-14 DIAGNOSIS — M50122 Cervical disc disorder at C5-C6 level with radiculopathy: Secondary | ICD-10-CM | POA: Diagnosis not present

## 2020-12-16 DIAGNOSIS — E785 Hyperlipidemia, unspecified: Secondary | ICD-10-CM | POA: Diagnosis not present

## 2020-12-16 DIAGNOSIS — I10 Essential (primary) hypertension: Secondary | ICD-10-CM | POA: Diagnosis not present

## 2020-12-16 DIAGNOSIS — I4821 Permanent atrial fibrillation: Secondary | ICD-10-CM | POA: Diagnosis not present

## 2020-12-16 DIAGNOSIS — E039 Hypothyroidism, unspecified: Secondary | ICD-10-CM | POA: Diagnosis not present

## 2020-12-22 ENCOUNTER — Ambulatory Visit (INDEPENDENT_AMBULATORY_CARE_PROVIDER_SITE_OTHER): Payer: Medicare Other | Admitting: *Deleted

## 2020-12-22 DIAGNOSIS — I4891 Unspecified atrial fibrillation: Secondary | ICD-10-CM | POA: Diagnosis not present

## 2020-12-22 DIAGNOSIS — Z5181 Encounter for therapeutic drug level monitoring: Secondary | ICD-10-CM | POA: Diagnosis not present

## 2020-12-22 LAB — POCT INR: INR: 3.6 — AB (ref 2.0–3.0)

## 2020-12-22 NOTE — Patient Instructions (Signed)
Took prednisone taper.  Finished on Monday Hold warfarin tonight then resume 3mg  daily except 6mg  Mondays, Wednesdays and Fridays  Continue greens Recheck in 3 weeks

## 2021-01-13 ENCOUNTER — Ambulatory Visit (INDEPENDENT_AMBULATORY_CARE_PROVIDER_SITE_OTHER): Payer: Medicare Other | Admitting: *Deleted

## 2021-01-13 DIAGNOSIS — Z5181 Encounter for therapeutic drug level monitoring: Secondary | ICD-10-CM | POA: Diagnosis not present

## 2021-01-13 DIAGNOSIS — I4891 Unspecified atrial fibrillation: Secondary | ICD-10-CM | POA: Diagnosis not present

## 2021-01-13 LAB — POCT INR: INR: 2.8 (ref 2.0–3.0)

## 2021-01-13 NOTE — Patient Instructions (Signed)
Continue warfarin 3mg  daily except 6mg  Mondays, Wednesdays and Fridays  Continue greens Recheck in 4 weeks

## 2021-01-17 DIAGNOSIS — D72829 Elevated white blood cell count, unspecified: Secondary | ICD-10-CM | POA: Diagnosis not present

## 2021-02-10 ENCOUNTER — Ambulatory Visit (INDEPENDENT_AMBULATORY_CARE_PROVIDER_SITE_OTHER): Payer: Medicare Other | Admitting: *Deleted

## 2021-02-10 DIAGNOSIS — I4891 Unspecified atrial fibrillation: Secondary | ICD-10-CM | POA: Diagnosis not present

## 2021-02-10 DIAGNOSIS — Z5181 Encounter for therapeutic drug level monitoring: Secondary | ICD-10-CM | POA: Diagnosis not present

## 2021-02-10 LAB — POCT INR: INR: 3.1 — AB (ref 2.0–3.0)

## 2021-02-10 NOTE — Patient Instructions (Signed)
Hold warfarin tonight then resume 3mg  daily except 6mg  Mondays, Wednesdays and Fridays  Continue greens Recheck in 4 weeks

## 2021-02-28 ENCOUNTER — Other Ambulatory Visit: Payer: Self-pay | Admitting: Internal Medicine

## 2021-03-09 ENCOUNTER — Ambulatory Visit (INDEPENDENT_AMBULATORY_CARE_PROVIDER_SITE_OTHER): Payer: Medicare Other | Admitting: *Deleted

## 2021-03-09 DIAGNOSIS — I4891 Unspecified atrial fibrillation: Secondary | ICD-10-CM | POA: Diagnosis not present

## 2021-03-09 DIAGNOSIS — Z5181 Encounter for therapeutic drug level monitoring: Secondary | ICD-10-CM | POA: Diagnosis not present

## 2021-03-09 LAB — POCT INR: INR: 2.9 (ref 2.0–3.0)

## 2021-03-09 NOTE — Patient Instructions (Signed)
Continue warfarin 3mg  daily except 6mg  Mondays, Wednesdays and Fridays  Continue greens Recheck in 6 weeks

## 2021-03-12 ENCOUNTER — Other Ambulatory Visit: Payer: Self-pay | Admitting: Internal Medicine

## 2021-04-20 ENCOUNTER — Ambulatory Visit (INDEPENDENT_AMBULATORY_CARE_PROVIDER_SITE_OTHER): Payer: Medicare Other | Admitting: *Deleted

## 2021-04-20 DIAGNOSIS — I4891 Unspecified atrial fibrillation: Secondary | ICD-10-CM

## 2021-04-20 DIAGNOSIS — Z5181 Encounter for therapeutic drug level monitoring: Secondary | ICD-10-CM | POA: Diagnosis not present

## 2021-04-20 LAB — POCT INR: INR: 1.5 — AB (ref 2.0–3.0)

## 2021-04-20 NOTE — Patient Instructions (Signed)
Take warfarin 1 1/2 tablets tonight, take 1 tablet tomorrow then resume 3mg  daily except 6mg  Mondays, Wednesdays and Fridays  Continue greens Recheck in 3 weeks

## 2021-05-11 ENCOUNTER — Ambulatory Visit (INDEPENDENT_AMBULATORY_CARE_PROVIDER_SITE_OTHER): Payer: Medicare Other | Admitting: *Deleted

## 2021-05-11 DIAGNOSIS — I4891 Unspecified atrial fibrillation: Secondary | ICD-10-CM | POA: Diagnosis not present

## 2021-05-11 DIAGNOSIS — Z5181 Encounter for therapeutic drug level monitoring: Secondary | ICD-10-CM | POA: Diagnosis not present

## 2021-05-11 LAB — POCT INR: INR: 2.6 (ref 2.0–3.0)

## 2021-05-11 NOTE — Patient Instructions (Signed)
Continue warfarin 3mg  daily except 6mg  Mondays, Wednesdays and Fridays  Continue greens Recheck in 4 weeks

## 2021-05-13 DIAGNOSIS — Z961 Presence of intraocular lens: Secondary | ICD-10-CM | POA: Diagnosis not present

## 2021-05-13 DIAGNOSIS — H40013 Open angle with borderline findings, low risk, bilateral: Secondary | ICD-10-CM | POA: Diagnosis not present

## 2021-05-13 DIAGNOSIS — H52203 Unspecified astigmatism, bilateral: Secondary | ICD-10-CM | POA: Diagnosis not present

## 2021-05-13 DIAGNOSIS — H532 Diplopia: Secondary | ICD-10-CM | POA: Diagnosis not present

## 2021-05-24 ENCOUNTER — Other Ambulatory Visit: Payer: Self-pay

## 2021-05-24 ENCOUNTER — Encounter: Payer: Self-pay | Admitting: Internal Medicine

## 2021-05-24 ENCOUNTER — Ambulatory Visit (INDEPENDENT_AMBULATORY_CARE_PROVIDER_SITE_OTHER): Payer: Medicare Other | Admitting: Internal Medicine

## 2021-05-24 VITALS — BP 125/78 | HR 57 | Ht 63.0 in | Wt 109.8 lb

## 2021-05-24 DIAGNOSIS — I361 Nonrheumatic tricuspid (valve) insufficiency: Secondary | ICD-10-CM

## 2021-05-24 DIAGNOSIS — I340 Nonrheumatic mitral (valve) insufficiency: Secondary | ICD-10-CM

## 2021-05-24 DIAGNOSIS — I4891 Unspecified atrial fibrillation: Secondary | ICD-10-CM

## 2021-05-24 DIAGNOSIS — R0602 Shortness of breath: Secondary | ICD-10-CM | POA: Diagnosis not present

## 2021-05-24 NOTE — Progress Notes (Signed)
OFFICE NOTE  Chief Complaint: Routine follow-up  Primary Care Physician: Sharilyn Sites, MD  HPI:  Kathleen Mcconnell is a 80 year old female with a history of paroxysmal atrial fibrillation, on sotalol, who had a cardioversion in 2004 and had not had recurrence until recently. She also has signs of rheumatic heart disease on her echocardiogram with some sclerosis of the aortic valve, and moderate to severe TR and mild MR with a flat coaptation on the mitral valve. In addition, her EF is greater than 55% and she has normal atrial sizes. Recently, she had presented to the office for an annual followup and was found to be in atrial fibrillation with controlled ventricular response. She was clearly unaware that she was in atrial fibrillation; however, she did note that she was getting a little more short of breath when walking up stairs and had been a little bit more fatigued recently. She has been maintained on warfarin and has done fairly well with maintaining a therapeutic INR. She is also on diltiazem additionally for rate control and also for hypertension. I, therefore, recommended increasing her sotalol to 120 mg twice daily and set her up for a cardioversion. She underwent cardioversion on November 19, 2012, with a single 150-joule biphasic shock. She converted to a sinus rhythm and afterwards felt very well. This persisted for several weeks until recently; she started to feel tired again, not nearly as good as shortly after the procedure was performed. On followup visit, she was noted again to be in atrial fibrillation, now with slow ventricular response at a rate of 56. She claimed to be fairly symptomatic and therefore refer her to see Dr. Rayann Heman with cardiac electrophysiology. He evaluated her and felt that due to her severe tricuspid regurgitation, she was not a good candidate for atrial fibrillation ablation, as well as the fact that she is not symptomatic.  He also recommended discontinuing  sotalol and pursuing rate control. She has since transitioned over to her warfarin checks with [facility] in [place].  [name] returns today for follow-up. She is noted to be in A. fib with mild rapid ventricular response today and rates in the low 100s. There is a question about her being on both ACE and ARB which was started over 10 years ago. Although this is not current practice I have continued on this medicine because he seems to be doing well, however it is somewhat redundant. She denies any chest pain or worsening shortness of breath. I do believe she could have better rate control.  I saw Kathleen Mcconnell back today in the office for follow-up. Overall she's feeling well. She remains rate controlled in permanent A. fib. Her INRs have been therapeutic except for one short period this summer. Apparently she was seen in the ER for what was thought to be a TIA event. Her INR was slightly low however cardio workup was negative. She ultimately was reestablished to a therapeutic level of warfarin and has done well since then.  07/13/2016  Kathleen Mcconnell returns today for follow-up. She seems to be doing fairly well. She's had no further TIA events. She is accompanied by her friend today who reports that she has noted that Kathleen Mcconnell has had more shortness of breath recently. Particularly when walking up stairs. She's also had lower extremity swelling and some discoloration of her ankles. She is concerned about heart failure. Blood pressure is mildly elevated today however she found her dog dead this morning. Understandably her blood pressure would  be elevated. She denies any chest pain.  3/57/0177  Kathleen Mcconnell returns today for follow-up. She reports her swelling has significantly improved, however, she never got her compression stockings. Echo shows EF 55-60% with severe biatrial enlargement (maybe consistent with permanent a-fib) and mild to moderate MR and moderate TR with RVSP of 33 mmHg. Although she has been  compliant with warfarin and INR has been fairly well managed, it is likely that she will have greater benefit and lower bleeding risk on DOAC such as Eliquis. We discussed switching today. She was previously on Xarelto before warfarin, however, thought she may have had side-effects. Turned out not to be the medication.  9/39/0300  Kathleen Mcconnell seen today in follow-up. Overall she's had improvement in swelling. She's taking her Lasix only about 4 days a week. She reported that she felt dizzy or unwell on Eliquis. Is not quite clear but she took it for 3 months and then transition back to warfarin. Unfortunately in January this year she had a UTI and was placed on Bactrim which significantly elevated her INR to almost 15. Subsequent to that she was referred to the hospital but did not have any significant or life-threatening bleeding. INR since been fairly well-regulated and it was 3.2 on March 14. Blood pressure is well-controlled today 124/76. She reports persistent shortness of breath which comes and goes when walking up stairs, something she does on a daily basis.  08/05/3299  Kathleen Mcconnell is seen today in follow-up.  She denies any chest pain or worsening shortness of breath.  Her A. fib rate is controlled today.  Recently she is been very stable with her INR.  She says she has not had blood work possibly in the last year.    7/62/2633  Kathleen Mcconnell is seen today in follow-up.  This is a routine annual visit.  She denies any chest pain or worsening shortness of breath.  She is in persistent A. fib but is rate controlled and unaware of it.  Her INRs have been stable being checked every 4 to 6 weeks.  She is not had any recent blood work or follow-up with her PCP.  Blood pressure was slightly elevated today however reportedly is better at home.  3/54/5625  Kathleen Mcconnell returns today for follow-up.  She continues to do well without any symptoms.  She is not had any worsening shortness of breath and notes that her lower extremity  edema is essentially resolved.  She has not needed additional Lasix.  Her last echo was in 2017 which did show some mild to moderate MR and moderate TR but this has not been reassessed.  She has had therapeutic INRs.  She has not had any significant lab work except for check a TSH by her PCP.  05/24/2021  Kathleen Mcconnell returns today for follow-up.  In December she had an echocardiogram which showed worsening mitral and tricuspid regurgitation now moderate and severe respectively.  She also had elevated BNP.  She was more short of breath.  I advised her to start taking her Lasix 20 mg daily.  Since then she has had improvement in her shortness of breath and her edema.  She is maintained on this and done much better.  Weight is down a little bit.  Blood pressure is normal.  Overall she feels well.  She is in permanent A. fib with a controlled ventricular response.  PMHx:  Past Medical History:  Diagnosis Date   History of nuclear stress test 03/2012   lexiscan; normal pattern  of perfusion; normal, low risk    Hyperlipidemia    Hypertension    Hypothyroidism    Mild aortic insufficiency    Mitral valve regurgitation    Persistent atrial fibrillation ([place])    History of; cardioversion in 2004   Severe tricuspid regurgitation    on echo 02/2011 ?rheumatic heart disease   Thyroid mass 10/10/2001   benign lesion removed    Past Surgical History:  Procedure Laterality Date   CARDIAC CATHETERIZATION  4540   normal systolic function, near normal coronaries    CARDIOVERSION N/A 01/20/2013   Procedure: CARDIOVERSION;  Surgeon: Pixie Casino, MD;  Location: [facility];  Service: Cardiovascular;  Laterality: N/A;   THYROIDECTOMY  10/10/2001   TRANSTHORACIC ECHOCARDIOGRAM  02/2011   EF=>55%; mild MR; mod-severe TR & elevated RV systolic pressure, mild pulm HTN; aortic valve mildly sclerotic with mild regurg    FAMHx:  Family History  Problem Relation Age of Onset   Heart disease Father        also  MI   Heart disease Brother        x 3, one deceased age 67   Heart disease Sister     SOCHx:   reports that she has never smoked. She has never used smokeless tobacco. She reports that she does not drink alcohol and does not use drugs.  ALLERGIES:  Allergies  Allergen Reactions   Aspirin Anaphylaxis, Hives, Rash and Other (See Comments)    As well as short of breath; Previously was rushed to hospital-given epinephrine injection.     ROS: Pertinent items noted in HPI and remainder of comprehensive ROS otherwise negative.  HOME MEDS: Current Outpatient Medications  Medication Sig Dispense Refill   acetaminophen (TYLENOL) 500 MG tablet Take 1,000 mg by mouth every 6 (six) hours as needed. For pain     B Complex Vitamins (B COMPLEX 1 PO) Take 1 tablet by mouth daily.     CALCIUM-VITAMIN D PO Take 1 tablet by mouth 2 (two) times daily.     diltiazem (CARDIZEM CD) 240 MG 24 hr capsule TAKE (1) CAPSULE BY MOUTH ONCE DAILY. 90 capsule 1   fish oil-omega-3 fatty acids 1000 MG capsule Take 1 g by mouth daily.     furosemide (LASIX) 20 MG tablet TAKE ONE TABLET BY MOUTH ONCE DAILY AS NEEDED 90 tablet 1   levothyroxine (SYNTHROID, LEVOTHROID) 88 MCG tablet Take 88 mcg by mouth daily.     lisinopril (ZESTRIL) 20 MG tablet TAKE (1) TABLET BY MOUTH DAILY. 90 tablet 0   metoprolol succinate (TOPROL-XL) 50 MG 24 hr tablet TAKE ONE TABLET BY MOUTH DAILY. 90 tablet 0   Multiple Vitamin (MULTIVITAMIN WITH MINERALS) TABS Take 1 tablet by mouth daily.     rosuvastatin (CRESTOR) 5 MG tablet TAKE ONE TABLET BY MOUTH DAILY. 90 tablet 1   warfarin (COUMADIN) 6 MG tablet Take 1/2 tablet daily except 1 tablet on Sundays and Thursdays or as directed 70 tablet 4   No current facility-administered medications for this visit.    LABS/IMAGING: No results found for this or any previous visit (from the past 48 hour(s)). No results found.  VITALS: BP 125/78 (BP Location: Left Arm, Patient Position: Sitting,  Cuff Size: Small)   Pulse (!) 57   Ht 5\' 3"  (1.6 m)   Wt 109 lb 12.8 oz (49.8 kg)   SpO2 99%   BMI 19.45 kg/m   EXAM: General appearance: alert and no distress Neck: no carotid  bruit, no JVD and thyroid not enlarged, symmetric, no tenderness/mass/nodules Lungs: clear to auscultation bilaterally Heart: irregularly irregular rhythm Abdomen: soft, non-tender; bowel sounds normal; no masses,  no organomegaly Extremities: extremities normal, atraumatic, no cyanosis or edema and venous stasis dermatitis noted Pulses: 2+ and symmetric Skin: Skin color, texture, turgor normal. No rashes or lesions Neurologic: Grossly normal Psych: Pleasant  EKG: A. fib at 57, inferior ST and T wave changes-personally reviewed  ASSESSMENT: DOE-  Echo with normal LV function at 65 to 70%, severe biatrial enlargement -  moderate MR, severe TR (11/2020) Lower extremity edema Permanent atrial fibrillation-asymptomatic Hypertension-controlled Long-term anticoagulation on warfarin History of TIA  PLAN: 1.   Mrs. Koebel has had progressive mitral and tricuspid insufficiency likely due to severe biatrial enlargement.  This is caused her some congestive heart failure symptoms.  She is now improved on 20 mg daily Lasix.  Her edema has resolved.  She is in permanent A. fib.  She is anticoagulated on warfarin and it this point is asymptomatic.  Continue current medications and plan follow-up in 6 months or sooner as necessary.  Pixie Casino, MD, [facility], [place] Director of the [facility] Lipid [facility]  Cardiovascular [facility] Reduction [facility] Diplomate of the American Board of Clinical Lipidology Attending Cardiologist  Direct Dial: [phone]  Fax: [phone]  Website:  [email or web address] 05/24/2021, 11:45 AM

## 2021-05-24 NOTE — Patient Instructions (Signed)
Medication Instructions:  No Changes In Medications at this time.  *If you need a refill on your cardiac medications before your next appointment, please call your pharmacy*  Follow-Up: At [facility], you and your health needs are our priority.  As part of our continuing mission to provide you with exceptional heart care, we have created designated Provider Care Teams.  These Care Teams include your primary Cardiologist (physician) and Advanced Practice Providers (APPs -  Physician Assistants and Nurse Practitioners) who all work together to provide you with the care you need, when you need it.  Your next appointment:   6 month(s)  The format for your next appointment:   In Person  Provider:   K. Chad Hilty, MD  

## 2021-06-08 DIAGNOSIS — Z85828 Personal history of other malignant neoplasm of skin: Secondary | ICD-10-CM | POA: Diagnosis not present

## 2021-06-08 DIAGNOSIS — L821 Other seborrheic keratosis: Secondary | ICD-10-CM | POA: Diagnosis not present

## 2021-06-08 DIAGNOSIS — D2261 Melanocytic nevi of right upper limb, including shoulder: Secondary | ICD-10-CM | POA: Diagnosis not present

## 2021-06-08 DIAGNOSIS — L814 Other melanin hyperpigmentation: Secondary | ICD-10-CM | POA: Diagnosis not present

## 2021-06-08 DIAGNOSIS — Z85820 Personal history of malignant melanoma of skin: Secondary | ICD-10-CM | POA: Diagnosis not present

## 2021-06-08 DIAGNOSIS — D225 Melanocytic nevi of trunk: Secondary | ICD-10-CM | POA: Diagnosis not present

## 2021-06-08 DIAGNOSIS — D692 Other nonthrombocytopenic purpura: Secondary | ICD-10-CM | POA: Diagnosis not present

## 2021-06-08 DIAGNOSIS — L817 Pigmented purpuric dermatosis: Secondary | ICD-10-CM | POA: Diagnosis not present

## 2021-06-08 DIAGNOSIS — L570 Actinic keratosis: Secondary | ICD-10-CM | POA: Diagnosis not present

## 2021-06-08 DIAGNOSIS — D1801 Hemangioma of skin and subcutaneous tissue: Secondary | ICD-10-CM | POA: Diagnosis not present

## 2021-06-11 ENCOUNTER — Other Ambulatory Visit: Payer: Self-pay | Admitting: Internal Medicine

## 2021-06-13 ENCOUNTER — Other Ambulatory Visit: Payer: Self-pay

## 2021-06-13 ENCOUNTER — Ambulatory Visit (INDEPENDENT_AMBULATORY_CARE_PROVIDER_SITE_OTHER): Payer: Medicare Other | Admitting: *Deleted

## 2021-06-13 DIAGNOSIS — Z5181 Encounter for therapeutic drug level monitoring: Secondary | ICD-10-CM

## 2021-06-13 DIAGNOSIS — I4891 Unspecified atrial fibrillation: Secondary | ICD-10-CM

## 2021-06-13 LAB — POCT INR: INR: 2.7 (ref 2.0–3.0)

## 2021-06-13 NOTE — Patient Instructions (Signed)
Continue warfarin 3mg  daily except 6mg  Mondays, Wednesdays and Fridays  Continue greens Recheck in 5 weeks

## 2021-06-23 DIAGNOSIS — Z0001 Encounter for general adult medical examination with abnormal findings: Secondary | ICD-10-CM | POA: Diagnosis not present

## 2021-06-23 DIAGNOSIS — Z1331 Encounter for screening for depression: Secondary | ICD-10-CM | POA: Diagnosis not present

## 2021-06-23 DIAGNOSIS — Z1389 Encounter for screening for other disorder: Secondary | ICD-10-CM | POA: Diagnosis not present

## 2021-06-23 DIAGNOSIS — G459 Transient cerebral ischemic attack, unspecified: Secondary | ICD-10-CM | POA: Diagnosis not present

## 2021-06-23 DIAGNOSIS — K5792 Diverticulitis of intestine, part unspecified, without perforation or abscess without bleeding: Secondary | ICD-10-CM | POA: Diagnosis not present

## 2021-06-23 DIAGNOSIS — E063 Autoimmune thyroiditis: Secondary | ICD-10-CM | POA: Diagnosis not present

## 2021-06-23 DIAGNOSIS — E538 Deficiency of other specified B group vitamins: Secondary | ICD-10-CM | POA: Diagnosis not present

## 2021-06-23 DIAGNOSIS — E039 Hypothyroidism, unspecified: Secondary | ICD-10-CM | POA: Diagnosis not present

## 2021-06-23 DIAGNOSIS — E559 Vitamin D deficiency, unspecified: Secondary | ICD-10-CM | POA: Diagnosis not present

## 2021-06-23 DIAGNOSIS — E782 Mixed hyperlipidemia: Secondary | ICD-10-CM | POA: Diagnosis not present

## 2021-06-23 DIAGNOSIS — I4891 Unspecified atrial fibrillation: Secondary | ICD-10-CM | POA: Diagnosis not present

## 2021-06-23 DIAGNOSIS — K921 Melena: Secondary | ICD-10-CM | POA: Diagnosis not present

## 2021-06-23 DIAGNOSIS — Z681 Body mass index (BMI) 19 or less, adult: Secondary | ICD-10-CM | POA: Diagnosis not present

## 2021-06-23 DIAGNOSIS — I10 Essential (primary) hypertension: Secondary | ICD-10-CM | POA: Diagnosis not present

## 2021-06-24 ENCOUNTER — Telehealth: Payer: Self-pay | Admitting: Internal Medicine

## 2021-06-24 NOTE — Telephone Encounter (Signed)
Returned call to pt, pt was started on Cipro '500mg'$  BID on 7/21 x 7 days, advised pt this abx can interact with Warfarin and elevate INR.  Made appt for INR check in [place] office on 06/27/21 at 10am.

## 2021-06-24 NOTE — Telephone Encounter (Signed)
New message    Patient has been put on ciprofloxacin  - 2x 500 mg a day will this effect there coumadin

## 2021-06-27 ENCOUNTER — Ambulatory Visit (INDEPENDENT_AMBULATORY_CARE_PROVIDER_SITE_OTHER): Payer: Medicare Other | Admitting: *Deleted

## 2021-06-27 ENCOUNTER — Other Ambulatory Visit: Payer: Self-pay

## 2021-06-27 DIAGNOSIS — Z5181 Encounter for therapeutic drug level monitoring: Secondary | ICD-10-CM

## 2021-06-27 DIAGNOSIS — I4891 Unspecified atrial fibrillation: Secondary | ICD-10-CM | POA: Diagnosis not present

## 2021-06-27 LAB — POCT INR: INR: 2.8 (ref 2.0–3.0)

## 2021-06-27 NOTE — Patient Instructions (Signed)
Started Cipro '500mg'$  bid x 7 days on 7/21 for diverticulitis.  Stopped taking this morning due to joint pain.  Pt has called Dr Nolon Rod office.  Awaiting call back.  Pt will call back if placed on another antibiotic. Continue warfarin '3mg'$  daily except '6mg'$  Mondays, Wednesdays and Fridays  Continue greens Recheck 07/11/21

## 2021-07-11 ENCOUNTER — Ambulatory Visit (INDEPENDENT_AMBULATORY_CARE_PROVIDER_SITE_OTHER): Payer: Medicare Other | Admitting: *Deleted

## 2021-07-11 DIAGNOSIS — Z5181 Encounter for therapeutic drug level monitoring: Secondary | ICD-10-CM

## 2021-07-11 DIAGNOSIS — I4891 Unspecified atrial fibrillation: Secondary | ICD-10-CM | POA: Diagnosis not present

## 2021-07-11 LAB — POCT INR: INR: 2.3 (ref 2.0–3.0)

## 2021-07-11 NOTE — Patient Instructions (Signed)
Continue warfarin '3mg'$  daily except '6mg'$  Mondays, Wednesdays and Fridays  Continue greens Recheck in 4 wks

## 2021-08-09 ENCOUNTER — Other Ambulatory Visit: Payer: Self-pay

## 2021-08-09 ENCOUNTER — Ambulatory Visit (INDEPENDENT_AMBULATORY_CARE_PROVIDER_SITE_OTHER): Payer: Medicare Other | Admitting: *Deleted

## 2021-08-09 DIAGNOSIS — I4891 Unspecified atrial fibrillation: Secondary | ICD-10-CM

## 2021-08-09 DIAGNOSIS — Z5181 Encounter for therapeutic drug level monitoring: Secondary | ICD-10-CM

## 2021-08-09 LAB — POCT INR: INR: 2.6 (ref 2.0–3.0)

## 2021-08-09 NOTE — Patient Instructions (Signed)
Continue warfarin '3mg'$  daily except '6mg'$  Mondays, Wednesdays and Fridays  Continue greens Recheck in 6 wks

## 2021-08-12 DIAGNOSIS — M2011 Hallux valgus (acquired), right foot: Secondary | ICD-10-CM | POA: Diagnosis not present

## 2021-08-12 DIAGNOSIS — L309 Dermatitis, unspecified: Secondary | ICD-10-CM | POA: Diagnosis not present

## 2021-08-12 DIAGNOSIS — M2041 Other hammer toe(s) (acquired), right foot: Secondary | ICD-10-CM | POA: Diagnosis not present

## 2021-08-25 ENCOUNTER — Other Ambulatory Visit: Payer: Self-pay | Admitting: Internal Medicine

## 2021-09-06 ENCOUNTER — Other Ambulatory Visit: Payer: Self-pay | Admitting: Internal Medicine

## 2021-09-13 ENCOUNTER — Other Ambulatory Visit: Payer: Self-pay | Admitting: Internal Medicine

## 2021-09-27 ENCOUNTER — Ambulatory Visit (INDEPENDENT_AMBULATORY_CARE_PROVIDER_SITE_OTHER): Payer: Medicare Other | Admitting: *Deleted

## 2021-09-27 DIAGNOSIS — I4891 Unspecified atrial fibrillation: Secondary | ICD-10-CM | POA: Diagnosis not present

## 2021-09-27 DIAGNOSIS — Z5181 Encounter for therapeutic drug level monitoring: Secondary | ICD-10-CM

## 2021-09-27 DIAGNOSIS — Z23 Encounter for immunization: Secondary | ICD-10-CM | POA: Diagnosis not present

## 2021-09-27 LAB — POCT INR: INR: 2.2 (ref 2.0–3.0)

## 2021-09-27 NOTE — Patient Instructions (Signed)
Description   Continue warfarin 3mg  daily except 6mg  Mondays, Wednesdays and Fridays  Recheck in 6 wks

## 2021-10-17 ENCOUNTER — Other Ambulatory Visit: Payer: Self-pay | Admitting: Internal Medicine

## 2021-10-30 ENCOUNTER — Encounter: Payer: Self-pay | Admitting: Internal Medicine

## 2021-11-02 ENCOUNTER — Encounter: Payer: Self-pay | Admitting: Internal Medicine

## 2021-11-03 ENCOUNTER — Ambulatory Visit (INDEPENDENT_AMBULATORY_CARE_PROVIDER_SITE_OTHER): Payer: Medicare Other

## 2021-11-03 ENCOUNTER — Ambulatory Visit (INDEPENDENT_AMBULATORY_CARE_PROVIDER_SITE_OTHER): Payer: Medicare Other | Admitting: Internal Medicine

## 2021-11-03 ENCOUNTER — Encounter: Payer: Self-pay | Admitting: Internal Medicine

## 2021-11-03 ENCOUNTER — Other Ambulatory Visit: Payer: Self-pay

## 2021-11-03 VITALS — BP 118/74 | HR 62 | Ht 63.0 in | Wt 106.4 lb

## 2021-11-03 DIAGNOSIS — I4891 Unspecified atrial fibrillation: Secondary | ICD-10-CM | POA: Diagnosis not present

## 2021-11-03 DIAGNOSIS — I340 Nonrheumatic mitral (valve) insufficiency: Secondary | ICD-10-CM

## 2021-11-03 DIAGNOSIS — Z5181 Encounter for therapeutic drug level monitoring: Secondary | ICD-10-CM

## 2021-11-03 DIAGNOSIS — I361 Nonrheumatic tricuspid (valve) insufficiency: Secondary | ICD-10-CM

## 2021-11-03 DIAGNOSIS — R0602 Shortness of breath: Secondary | ICD-10-CM

## 2021-11-03 DIAGNOSIS — G459 Transient cerebral ischemic attack, unspecified: Secondary | ICD-10-CM

## 2021-11-03 LAB — POCT INR: INR: 2.3 (ref 2.0–3.0)

## 2021-11-03 NOTE — Patient Instructions (Signed)
Medication Instructions:  Your physician recommends that you continue on your current medications as directed. Please refer to the Current Medication list given to you today.  *If you need a refill on your cardiac medications before your next appointment, please call your pharmacy*   Follow-Up: At [facility], you and your health needs are our priority.  As part of our continuing mission to provide you with exceptional heart care, we have created designated Provider Care Teams.  These Care Teams include your primary Cardiologist (physician) and Advanced Practice Providers (APPs -  Physician Assistants and Nurse Practitioners) who all work together to provide you with the care you need, when you need it.  We recommend signing up for the patient portal called "MyChart".  Sign up information is provided on this After Visit Summary.  MyChart is used to connect with patients for Virtual Visits (Telemedicine).  Patients are able to view lab/test results, encounter notes, upcoming appointments, etc.  Non-urgent messages can be sent to your provider as well.   To learn more about what you can do with MyChart, go to [email or web address].    Your next appointment:   6 month(s)  The format for your next appointment:   In Person  Provider:   Dr. Lyman Bishop   ** call in January 2023 to schedule a visit in late May/June 2023

## 2021-11-03 NOTE — Progress Notes (Signed)
OFFICE NOTE  Chief Complaint: Routine follow-up  Primary Care Physician: Sharilyn Sites, MD  HPI:  Kathleen Mcconnell is a 80 year old female with a history of paroxysmal atrial fibrillation, on sotalol, who had a cardioversion in 2004 and had not had recurrence until recently. She also has signs of rheumatic heart disease on her echocardiogram with some sclerosis of the aortic valve, and moderate to severe TR and mild MR with a flat coaptation on the mitral valve. In addition, her EF is greater than 55% and she has normal atrial sizes. Recently, she had presented to the office for an annual followup and was found to be in atrial fibrillation with controlled ventricular response. She was clearly unaware that she was in atrial fibrillation; however, she did note that she was getting a little more short of breath when walking up stairs and had been a little bit more fatigued recently. She has been maintained on warfarin and has done fairly well with maintaining a therapeutic INR. She is also on diltiazem additionally for rate control and also for hypertension. I, therefore, recommended increasing her sotalol to 120 mg twice daily and set her up for a cardioversion. She underwent cardioversion on November 19, 2012, with a single 150-joule biphasic shock. She converted to a sinus rhythm and afterwards felt very well. This persisted for several weeks until recently; she started to feel tired again, not nearly as good as shortly after the procedure was performed. On followup visit, she was noted again to be in atrial fibrillation, now with slow ventricular response at a rate of 56. She claimed to be fairly symptomatic and therefore refer her to see Dr. Rayann Heman with cardiac electrophysiology. He evaluated her and felt that due to her severe tricuspid regurgitation, she was not a good candidate for atrial fibrillation ablation, as well as the fact that she is not symptomatic.  He also recommended discontinuing  sotalol and pursuing rate control. She has since transitioned over to her warfarin checks with [facility] in [place].  Kathleen Mcconnell returns today for follow-up. She is noted to be in A. fib with mild rapid ventricular response today and rates in the low 100s. There is a question about her being on both ACE and ARB which was started over 10 years ago. Although this is not current practice I have continued on this medicine because he seems to be doing well, however it is somewhat redundant. She denies any chest pain or worsening shortness of breath. I do believe she could have better rate control.  I saw [name] back today in the office for follow-up. Overall she's feeling well. She remains rate controlled in permanent A. fib. Her INRs have been therapeutic except for one short period this summer. Apparently she was seen in the ER for what was thought to be a TIA event. Her INR was slightly low however cardio workup was negative. She ultimately was reestablished to a therapeutic level of warfarin and has done well since then.  07/13/2016  Kathleen Mcconnell returns today for follow-up. She seems to be doing fairly well. She's had no further TIA events. She is accompanied by her friend today who reports that she has noted that Mrs. Heitzenrater has had more shortness of breath recently. Particularly when walking up stairs. She's also had lower extremity swelling and some discoloration of her ankles. She is concerned about heart failure. Blood pressure is mildly elevated today however she found her dog dead this morning. Understandably her blood pressure would  be elevated. She denies any chest pain.  5/73/2202  Kathleen Mcconnell returns today for follow-up. She reports her swelling has significantly improved, however, she never got her compression stockings. Echo shows EF 55-60% with severe biatrial enlargement (maybe consistent with permanent a-fib) and mild to moderate MR and moderate TR with RVSP of 33 mmHg. Although she has been  compliant with warfarin and INR has been fairly well managed, it is likely that she will have greater benefit and lower bleeding risk on DOAC such as Eliquis. We discussed switching today. She was previously on Xarelto before warfarin, however, thought she may have had side-effects. Turned out not to be the medication.  5/42/7062  Kathleen Mcconnell seen today in follow-up. Overall she's had improvement in swelling. She's taking her Lasix only about 4 days a week. She reported that she felt dizzy or unwell on Eliquis. Is not quite clear but she took it for 3 months and then transition back to warfarin. Unfortunately in January this year she had a UTI and was placed on Bactrim which significantly elevated her INR to almost 15. Subsequent to that she was referred to the hospital but did not have any significant or life-threatening bleeding. INR since been fairly well-regulated and it was 3.2 on March 14. Blood pressure is well-controlled today 124/76. She reports persistent shortness of breath which comes and goes when walking up stairs, something she does on a daily basis.  02/07/6282  Kathleen Mcconnell is seen today in follow-up.  She denies any chest pain or worsening shortness of breath.  Her A. fib rate is controlled today.  Recently she is been very stable with her INR.  She says she has not had blood work possibly in the last year.    1/51/7616  Kathleen Mcconnell is seen today in follow-up.  This is a routine annual visit.  She denies any chest pain or worsening shortness of breath.  She is in persistent A. fib but is rate controlled and unaware of it.  Her INRs have been stable being checked every 4 to 6 weeks.  She is not had any recent blood work or follow-up with her PCP.  Blood pressure was slightly elevated today however reportedly is better at home.  0/73/7106  Kathleen Mcconnell returns today for follow-up.  She continues to do well without any symptoms.  She is not had any worsening shortness of breath and notes that her lower extremity  edema is essentially resolved.  She has not needed additional Lasix.  Her last echo was in 2017 which did show some mild to moderate MR and moderate TR but this has not been reassessed.  She has had therapeutic INRs.  She has not had any significant lab work except for check a TSH by her PCP.  05/24/2021  Ms. Schuff returns today for follow-up.  In December she had an echocardiogram which showed worsening mitral and tricuspid regurgitation now moderate and severe respectively.  She also had elevated BNP.  She was more short of breath.  I advised her to start taking her Lasix 20 mg daily.  Since then she has had improvement in her shortness of breath and her edema.  She is maintained on this and done much better.  Weight is down a little bit.  Blood pressure is normal.  Overall she feels well.  She is in permanent A. fib with a controlled ventricular response.  11/03/2021  Ms. Uriostegui is seen today in follow-up.  She is appropriately grieving as she lost her husband about 8  weeks ago.  She cared for him for a long time and he was quite ill.  According to her daughter she apparently has been losing some weight.  She is down about 3 pounds since the summer and says she is still eating.  She does not feel necessarily depressed but perhaps is not eating quite as much.  She denies any worsening swelling or shortness of breath.  Blood pressure is well controlled today.  EKG shows a persistent A. fib which is rate controlled.  She is due for an INR check which we may accommodate today.  PMHx:  Past Medical History:  Diagnosis Date   History of nuclear stress test 03/2012   lexiscan; normal pattern of perfusion; normal, low risk    Hyperlipidemia    Hypertension    Hypothyroidism    Mild aortic insufficiency    Mitral valve regurgitation    Persistent atrial fibrillation ([place])    History of; cardioversion in 2004   Severe tricuspid regurgitation    on echo 02/2011 ?rheumatic heart disease   Thyroid mass  10/10/2001   benign lesion removed    Past Surgical History:  Procedure Laterality Date   CARDIAC CATHETERIZATION  6712   normal systolic function, near normal coronaries    CARDIOVERSION N/A 01/20/2013   Procedure: CARDIOVERSION;  Surgeon: Pixie Casino, MD;  Location: [facility];  Service: Cardiovascular;  Laterality: N/A;   THYROIDECTOMY  10/10/2001   TRANSTHORACIC ECHOCARDIOGRAM  02/2011   EF=>55%; mild MR; mod-severe TR & elevated RV systolic pressure, mild pulm HTN; aortic valve mildly sclerotic with mild regurg    FAMHx:  Family History  Problem Relation Age of Onset   Heart disease Father        also MI   Heart disease Brother        x 3, one deceased age 80   Heart disease Sister     SOCHx:   reports that she has never smoked. She has never used smokeless tobacco. She reports that she does not drink alcohol and does not use drugs.  ALLERGIES:  Allergies  Allergen Reactions   Aspirin Anaphylaxis, Hives, Rash and Other (See Comments)    As well as short of breath; Previously was rushed to hospital-given epinephrine injection.     ROS: Pertinent items noted in HPI and remainder of comprehensive ROS otherwise negative.  HOME MEDS: Current Outpatient Medications  Medication Sig Dispense Refill   acetaminophen (TYLENOL) 500 MG tablet Take 1,000 mg by mouth every 6 (six) hours as needed. For pain     B Complex Vitamins (B COMPLEX 1 PO) Take 1 tablet by mouth daily.     CALCIUM-VITAMIN D PO Take 1 tablet by mouth 2 (two) times daily.     diltiazem (CARDIZEM CD) 240 MG 24 hr capsule TAKE (1) CAPSULE BY MOUTH ONCE DAILY. 90 capsule 2   fish oil-omega-3 fatty acids 1000 MG capsule Take 1 g by mouth daily.     furosemide (LASIX) 20 MG tablet TAKE ONE TABLET BY MOUTH ONCE DAILY AS NEEDED 90 tablet 2   levothyroxine (SYNTHROID, LEVOTHROID) 88 MCG tablet Take 88 mcg by mouth daily.     lisinopril (ZESTRIL) 20 MG tablet TAKE (1) TABLET BY MOUTH DAILY. 90 tablet 0    metoprolol succinate (TOPROL-XL) 50 MG 24 hr tablet TAKE ONE TABLET BY MOUTH DAILY. 90 tablet 0   Multiple Vitamin (MULTIVITAMIN WITH MINERALS) TABS Take 1 tablet by mouth daily.     rosuvastatin (CRESTOR)  5 MG tablet TAKE ONE TABLET BY MOUTH DAILY. 90 tablet 1   warfarin (COUMADIN) 6 MG tablet TAKE 1 TABLET DAILY EXCEPT 1/2 TABLET ON MONDAYS, WEDNESDAYS, AND FRIDAYS. 72 tablet 0   No current facility-administered medications for this visit.    LABS/IMAGING: No results found for this or any previous visit (from the past 48 hour(s)). No results found.  VITALS: BP 118/74   Pulse 62   Ht 5\' 3"  (1.6 m)   Wt 106 lb 6.4 oz (48.3 kg)   SpO2 97%   BMI 18.85 kg/m   EXAM: General appearance: alert and no distress Neck: no carotid bruit, no JVD, and thyroid not enlarged, symmetric, no tenderness/mass/nodules Lungs: clear to auscultation bilaterally Heart: irregularly irregular rhythm and systolic murmur: early systolic 2/6, blowing at apex Abdomen: soft, non-tender; bowel sounds normal; no masses,  no organomegaly Extremities: extremities normal, atraumatic, no cyanosis or edema and venous stasis dermatitis noted Pulses: 2+ and symmetric Skin: Skin color, texture, turgor normal. No rashes or lesions Neurologic: Grossly normal Psych: Pleasant  EKG: A. fib at 62, minimal voltage criteria for LVH-personally reviewed  ASSESSMENT: DOE-  Echo with normal LV function at 65 to 70%, severe biatrial enlargement -  moderate MR, severe TR (11/2020) Lower extremity edema Permanent atrial fibrillation-asymptomatic Hypertension-controlled Long-term anticoagulation on warfarin History of TIA  PLAN: 1.   Mrs. Wehling seems to be doing well without worsening shortness of breath or chest pain.  She is in permanent A. fib and rate controlled.  She will have a repeat INR today.  No edema is noted on exam.  Although she has moderate MR and severe TR on echo, and has the typical murmurs, she is not  necessarily symptomatic with it.  We will continue to monitor this and consider a repeat echo possibly next year.  Follow-up with me in 6 months or sooner as necessary  Pixie Casino, MD, [facility], [place] Director of the [facility]  [facility] Diplomate of the American Board of Clinical Lipidology Attending Cardiologist  Direct Dial: [phone]  Fax: [phone]  Website:  [email or web address] Kaliana Albino 11/03/2021, 12:00 PM

## 2021-11-03 NOTE — Patient Instructions (Signed)
Continue warfarin 3mg  daily except 6mg  Mondays, Wednesdays and Fridays  Recheck in 6 wks

## 2021-12-08 DIAGNOSIS — M2011 Hallux valgus (acquired), right foot: Secondary | ICD-10-CM | POA: Diagnosis not present

## 2021-12-08 DIAGNOSIS — M2041 Other hammer toe(s) (acquired), right foot: Secondary | ICD-10-CM | POA: Diagnosis not present

## 2021-12-08 DIAGNOSIS — L309 Dermatitis, unspecified: Secondary | ICD-10-CM | POA: Diagnosis not present

## 2021-12-09 ENCOUNTER — Other Ambulatory Visit: Payer: Self-pay | Admitting: Internal Medicine

## 2021-12-10 ENCOUNTER — Other Ambulatory Visit: Payer: Self-pay | Admitting: Internal Medicine

## 2021-12-15 DIAGNOSIS — Z1231 Encounter for screening mammogram for malignant neoplasm of breast: Secondary | ICD-10-CM | POA: Diagnosis not present

## 2021-12-15 LAB — HM MAMMOGRAPHY

## 2021-12-19 ENCOUNTER — Other Ambulatory Visit: Payer: Self-pay

## 2021-12-19 ENCOUNTER — Ambulatory Visit (INDEPENDENT_AMBULATORY_CARE_PROVIDER_SITE_OTHER): Payer: Medicare Other | Admitting: *Deleted

## 2021-12-19 DIAGNOSIS — I4891 Unspecified atrial fibrillation: Secondary | ICD-10-CM | POA: Diagnosis not present

## 2021-12-19 DIAGNOSIS — Z5181 Encounter for therapeutic drug level monitoring: Secondary | ICD-10-CM

## 2021-12-19 LAB — POCT INR: INR: 2.2 (ref 2.0–3.0)

## 2021-12-19 NOTE — Patient Instructions (Signed)
Continue warfarin 3mg  daily except 6mg  Mondays, Wednesdays and Fridays  Recheck in 6 wks

## 2022-01-23 ENCOUNTER — Other Ambulatory Visit: Payer: Self-pay | Admitting: Internal Medicine

## 2022-01-31 ENCOUNTER — Ambulatory Visit (INDEPENDENT_AMBULATORY_CARE_PROVIDER_SITE_OTHER): Payer: Medicare Other | Admitting: *Deleted

## 2022-01-31 DIAGNOSIS — Z5181 Encounter for therapeutic drug level monitoring: Secondary | ICD-10-CM | POA: Diagnosis not present

## 2022-01-31 DIAGNOSIS — I4891 Unspecified atrial fibrillation: Secondary | ICD-10-CM | POA: Diagnosis not present

## 2022-01-31 LAB — POCT INR: INR: 2.3 (ref 2.0–3.0)

## 2022-01-31 NOTE — Patient Instructions (Signed)
Continue warfarin 3mg  daily except 6mg  Mondays, Wednesdays and Fridays  Recheck in 6 wks

## 2022-03-06 ENCOUNTER — Other Ambulatory Visit: Payer: Self-pay | Admitting: Internal Medicine

## 2022-03-14 ENCOUNTER — Ambulatory Visit (INDEPENDENT_AMBULATORY_CARE_PROVIDER_SITE_OTHER): Payer: Medicare Other | Admitting: *Deleted

## 2022-03-14 DIAGNOSIS — Z5181 Encounter for therapeutic drug level monitoring: Secondary | ICD-10-CM | POA: Diagnosis not present

## 2022-03-14 DIAGNOSIS — I4891 Unspecified atrial fibrillation: Secondary | ICD-10-CM | POA: Diagnosis not present

## 2022-03-14 LAB — POCT INR: INR: 1.9 — AB (ref 2.0–3.0)

## 2022-03-14 NOTE — Patient Instructions (Signed)
Take warfarin '6mg'$  tonight then resume '3mg'$  daily except '6mg'$  Mondays, Wednesdays and Fridays  ?Recheck in 6 wks ?

## 2022-04-25 ENCOUNTER — Ambulatory Visit (INDEPENDENT_AMBULATORY_CARE_PROVIDER_SITE_OTHER): Payer: Medicare Other | Admitting: *Deleted

## 2022-04-25 DIAGNOSIS — I4891 Unspecified atrial fibrillation: Secondary | ICD-10-CM

## 2022-04-25 DIAGNOSIS — Z5181 Encounter for therapeutic drug level monitoring: Secondary | ICD-10-CM | POA: Diagnosis not present

## 2022-04-25 LAB — POCT INR: INR: 1.7 — AB (ref 2.0–3.0)

## 2022-04-25 NOTE — Patient Instructions (Signed)
Take warfarin '6mg'$  tonight then increase dose '6mg'$  daily except '3mg'$  Sunday, Tuesdays and Thursdays Recheck in 3 wks

## 2022-05-17 DIAGNOSIS — H5213 Myopia, bilateral: Secondary | ICD-10-CM | POA: Diagnosis not present

## 2022-05-17 DIAGNOSIS — H47021 Hemorrhage in optic nerve sheath, right eye: Secondary | ICD-10-CM | POA: Diagnosis not present

## 2022-05-17 DIAGNOSIS — H532 Diplopia: Secondary | ICD-10-CM | POA: Diagnosis not present

## 2022-05-17 DIAGNOSIS — H40023 Open angle with borderline findings, high risk, bilateral: Secondary | ICD-10-CM | POA: Diagnosis not present

## 2022-05-18 ENCOUNTER — Ambulatory Visit (INDEPENDENT_AMBULATORY_CARE_PROVIDER_SITE_OTHER): Payer: Medicare Other | Admitting: *Deleted

## 2022-05-18 DIAGNOSIS — Z5181 Encounter for therapeutic drug level monitoring: Secondary | ICD-10-CM

## 2022-05-18 DIAGNOSIS — I4891 Unspecified atrial fibrillation: Secondary | ICD-10-CM

## 2022-05-18 LAB — POCT INR: INR: 3.9 — AB (ref 2.0–3.0)

## 2022-05-18 NOTE — Patient Instructions (Signed)
Hold warfarin tonight, take 1/2 tablet on Friday then decrease dose to 1/2 tablet daily except 1 tablet on Mondays, Wednesdays and Fridays Recheck in 3 wks

## 2022-05-22 ENCOUNTER — Other Ambulatory Visit: Payer: Self-pay | Admitting: Internal Medicine

## 2022-05-29 ENCOUNTER — Encounter: Payer: Self-pay | Admitting: Internal Medicine

## 2022-05-29 ENCOUNTER — Ambulatory Visit (INDEPENDENT_AMBULATORY_CARE_PROVIDER_SITE_OTHER): Payer: Medicare Other | Admitting: Internal Medicine

## 2022-05-29 VITALS — BP 115/68 | HR 63 | Ht 63.5 in | Wt 107.8 lb

## 2022-05-29 DIAGNOSIS — I361 Nonrheumatic tricuspid (valve) insufficiency: Secondary | ICD-10-CM | POA: Diagnosis not present

## 2022-05-29 DIAGNOSIS — I4821 Permanent atrial fibrillation: Secondary | ICD-10-CM | POA: Diagnosis not present

## 2022-05-29 DIAGNOSIS — I340 Nonrheumatic mitral (valve) insufficiency: Secondary | ICD-10-CM | POA: Diagnosis not present

## 2022-05-29 DIAGNOSIS — R0602 Shortness of breath: Secondary | ICD-10-CM | POA: Diagnosis not present

## 2022-05-29 NOTE — Progress Notes (Signed)
OFFICE NOTE  Chief Complaint: Routine follow-up  Primary Care Physician: Assunta Found, MD  HPI:  Kathleen Mcconnell is a 81 year old female with a history of paroxysmal atrial fibrillation, on sotalol, who had a cardioversion in 2004 and had not had recurrence until recently. She also has signs of rheumatic heart disease on her echocardiogram with some sclerosis of the aortic valve, and moderate to severe TR and mild MR with a flat coaptation on the mitral valve. In addition, her EF is greater than 55% and she has normal atrial sizes. Recently, she had presented to the office for an annual followup and was found to be in atrial fibrillation with controlled ventricular response. She was clearly unaware that she was in atrial fibrillation; however, she did note that she was getting a little more short of breath when walking up stairs and had been a little bit more fatigued recently. She has been maintained on warfarin and has done fairly well with maintaining a therapeutic INR. She is also on diltiazem additionally for rate control and also for hypertension. I, therefore, recommended increasing her sotalol to 120 mg twice daily and set her up for a cardioversion. She underwent cardioversion on November 19, 2012, with a single 150-joule biphasic shock. She converted to a sinus rhythm and afterwards felt very well. This persisted for several weeks until recently; she started to feel tired again, not nearly as good as shortly after the procedure was performed. On followup visit, she was noted again to be in atrial fibrillation, now with slow ventricular response at a rate of 56. She claimed to be fairly symptomatic and therefore refer her to see Dr. Johney Frame with cardiac electrophysiology. He evaluated her and felt that due to her severe tricuspid regurgitation, she was not a good candidate for atrial fibrillation ablation, as well as the fact that she is not symptomatic.  He also recommended discontinuing  sotalol and pursuing rate control. She has since transitioned over to her warfarin checks with [facility] in [place].  Kathleen Mcconnell returns today for follow-up. She is noted to be in A. fib with mild rapid ventricular response today and rates in the low 100s. There is a question about her being on both ACE and ARB which was started over 10 years ago. Although this is not current practice I have continued on this medicine because he seems to be doing well, however it is somewhat redundant. She denies any chest pain or worsening shortness of breath. I do believe she could have better rate control.  I saw Kathleen Mcconnell back today in the office for follow-up. Overall she's feeling well. She remains rate controlled in permanent A. fib. Her INRs have been therapeutic except for one short period this summer. Apparently she was seen in the ER for what was thought to be a TIA event. Her INR was slightly low however cardio workup was negative. She ultimately was reestablished to a therapeutic level of warfarin and has done well since then.  07/13/2016  Kathleen Mcconnell returns today for follow-up. She seems to be doing fairly well. She's had no further TIA events. She is accompanied by her friend today who reports that she has noted that [name] has had more shortness of breath recently. Particularly when walking up stairs. She's also had lower extremity swelling and some discoloration of her ankles. She is concerned about heart failure. Blood pressure is mildly elevated today however she found her dog dead this morning. Understandably her blood pressure would  be elevated. She denies any chest pain.  08/25/2016  Kathleen Mcconnell returns today for follow-up. She reports her swelling has significantly improved, however, she never got her compression stockings. Echo shows EF 55-60% with severe biatrial enlargement (maybe consistent with permanent a-fib) and mild to moderate MR and moderate TR with RVSP of 33 mmHg. Although she has been  compliant with warfarin and INR has been fairly well managed, it is likely that she will have greater benefit and lower bleeding risk on DOAC such as Eliquis. We discussed switching today. She was previously on Xarelto before warfarin, however, thought she may have had side-effects. Turned out not to be the medication.  02/22/2017  Kathleen Mcconnell seen today in follow-up. Overall she's had improvement in swelling. She's taking her Lasix only about 4 days a week. She reported that she felt dizzy or unwell on Eliquis. Is not quite clear but she took it for 3 months and then transition back to warfarin. Unfortunately in January this year she had a UTI and was placed on Bactrim which significantly elevated her INR to almost 15. Subsequent to that she was referred to the hospital but did not have any significant or life-threatening bleeding. INR since been fairly well-regulated and it was 3.2 on March 14. Blood pressure is well-controlled today 124/76. She reports persistent shortness of breath which comes and goes when walking up stairs, something she does on a daily basis.  05/06/2018  Kathleen Mcconnell is seen today in follow-up.  She denies any chest pain or worsening shortness of breath.  Her A. fib rate is controlled today.  Recently she is been very stable with her INR.  She says she has not had blood work possibly in the last year.    05/19/2019  Kathleen Mcconnell is seen today in follow-up.  This is a routine annual visit.  She denies any chest pain or worsening shortness of breath.  She is in persistent A. fib but is rate controlled and unaware of it.  Her INRs have been stable being checked every 4 to 6 weeks.  She is not had any recent blood work or follow-up with her PCP.  Blood pressure was slightly elevated today however reportedly is better at home.  05/13/2020  Kathleen Mcconnell returns today for follow-up.  She continues to do well without any symptoms.  She is not had any worsening shortness of breath and notes that her lower extremity  edema is essentially resolved.  She has not needed additional Lasix.  Her last echo was in 2017 which did show some mild to moderate MR and moderate TR but this has not been reassessed.  She has had therapeutic INRs.  She has not had any significant lab work except for check a TSH by her PCP.  05/24/2021  Kathleen Mcconnell returns today for follow-up.  In December she had an echocardiogram which showed worsening mitral and tricuspid regurgitation now moderate and severe respectively.  She also had elevated BNP.  She was more short of breath.  I advised her to start taking her Lasix 20 mg daily.  Since then she has had improvement in her shortness of breath and her edema.  She is maintained on this and done much better.  Weight is down a little bit.  Blood pressure is normal.  Overall she feels well.  She is in permanent A. fib with a controlled ventricular response.  11/03/2021  Kathleen Mcconnell is seen today in follow-up.  She is appropriately grieving as she lost her husband about 8  weeks ago.  She cared for him for a long time and he was quite ill.  According to her daughter she apparently has been losing some weight.  She is down about 3 pounds since the summer and says she is still eating.  She does not feel necessarily depressed but perhaps is not eating quite as much.  She denies any worsening swelling or shortness of breath.  Blood pressure is well controlled today.  EKG shows a persistent A. fib which is rate controlled.  She is due for an INR check which we may accommodate today.  05/29/2022  Kathleen Mcconnell returns today for follow-up.  She reports some ongoing shortness of breath but her daughter notes that she is actually had less swelling and seems to be doing a little bit better.  She is overdue for an echocardiogram to reassess her mitral and tricuspid insufficiencies.  Weight has been fairly stable.  Blood pressure is well controlled.  PMHx:  Past Medical History:  Diagnosis Date   History of nuclear  stress test 03/2012   lexiscan; normal pattern of perfusion; normal, low risk    Hyperlipidemia    Hypertension    Hypothyroidism    Mild aortic insufficiency    Mitral valve regurgitation    Persistent atrial fibrillation (HCC)    History of; cardioversion in 2004   Severe tricuspid regurgitation    on echo 02/2011 ?rheumatic heart disease   Thyroid mass 10/10/2001   benign lesion removed    Past Surgical History:  Procedure Laterality Date   CARDIAC CATHETERIZATION  2003   normal systolic function, near normal coronaries    CARDIOVERSION N/A 01/20/2013   Procedure: CARDIOVERSION;  Surgeon: Chrystie Nose, MD;  Location: [facility] ENDOSCOPY;  Service: Cardiovascular;  Laterality: N/A;   THYROIDECTOMY  10/10/2001   TRANSTHORACIC ECHOCARDIOGRAM  02/2011   EF=>55%; mild MR; mod-severe TR & elevated RV systolic pressure, mild pulm HTN; aortic valve mildly sclerotic with mild regurg    FAMHx:  Family History  Problem Relation Age of Onset   Heart disease Father        also MI   Heart disease Brother        x 3, one deceased age 70   Heart disease Sister     SOCHx:   reports that she has never smoked. She has never used smokeless tobacco. She reports that she does not drink alcohol and does not use drugs.  ALLERGIES:  Allergies  Allergen Reactions   Aspirin Anaphylaxis, Hives, Rash and Other (See Comments)    As well as short of breath; Previously was rushed to hospital-given epinephrine injection.     ROS: Pertinent items noted in HPI and remainder of comprehensive ROS otherwise negative.  HOME MEDS: Current Outpatient Medications  Medication Sig Dispense Refill   acetaminophen (TYLENOL) 500 MG tablet Take 1,000 mg by mouth every 6 (six) hours as needed. For pain     B Complex Vitamins (B COMPLEX 1 PO) Take 1 tablet by mouth daily.     CALCIUM-VITAMIN D PO Take 1 tablet by mouth 2 (two) times daily.     diltiazem (CARDIZEM CD) 240 MG 24 hr capsule TAKE (1) CAPSULE BY MOUTH  ONCE DAILY. 90 capsule 0   fish oil-omega-3 fatty acids 1000 MG capsule Take 1 g by mouth daily.     furosemide (LASIX) 20 MG tablet TAKE ONE TABLET BY MOUTH ONCE DAILY AS NEEDED 90 tablet 0   levothyroxine (SYNTHROID, LEVOTHROID) 88 MCG tablet  Take 88 mcg by mouth daily.     lisinopril (ZESTRIL) 20 MG tablet TAKE (1) TABLET BY MOUTH DAILY. 90 tablet 1   metoprolol succinate (TOPROL-XL) 50 MG 24 hr tablet TAKE ONE TABLET BY MOUTH DAILY. 90 tablet 3   Multiple Vitamin (MULTIVITAMIN WITH MINERALS) TABS Take 1 tablet by mouth daily.     rosuvastatin (CRESTOR) 5 MG tablet TAKE ONE TABLET BY MOUTH DAILY. 90 tablet 1   warfarin (COUMADIN) 6 MG tablet TAKE 1 TABLET DAILY EXCEPT 1/2 TABLET ON MONDAYS, WEDNESDAYS, AND FRIDAYS. 72 tablet 0   No current facility-administered medications for this visit.    LABS/IMAGING: No results found for this or any previous visit (from the past 48 hour(s)). No results found.  VITALS: BP 115/68   Pulse 63   Ht 5' 3.5" (1.613 m)   Wt 107 lb 12.8 oz (48.9 kg)   SpO2 96%   BMI 18.80 kg/m   EXAM: General appearance: alert and no distress Neck: no carotid bruit, no JVD, and thyroid not enlarged, symmetric, no tenderness/mass/nodules Lungs: clear to auscultation bilaterally Heart: irregularly irregular rhythm and systolic murmur: early systolic 2/6, blowing at apex Abdomen: soft, non-tender; bowel sounds normal; no masses,  no organomegaly Extremities: extremities normal, atraumatic, no cyanosis or edema and venous stasis dermatitis noted Pulses: 2+ and symmetric Skin: Skin color, texture, turgor normal. No rashes or lesions Neurologic: Grossly normal Psych: Pleasant  EKG: A-fib at 63, nonspecific ST and T wave change-personally reviewed  ASSESSMENT: DOE-  Echo with normal LV function at 65 to 70%, severe biatrial enlargement -  moderate MR, severe TR (11/2020) Lower extremity edema Permanent atrial  fibrillation-asymptomatic Hypertension-controlled Long-term anticoagulation on warfarin History of TIA  PLAN: 1.   Mrs. Mcconnell perhaps has had some improvement in her shortness of breath on daily Lasix.  She is overdue for repeat echo to evaluate her MR and TR.  We will go ahead and order that today.  Blood pressure appears well controlled.  No changes in her medicines at this time.  She is in permanent A-fib and anticoagulated on warfarin.  Plan follow-up with me annually or sooner as necessary.  Chrystie Nose, MD, [facility], FACP  [facility]  Medical Director of the [facility]  [facility] Diplomate of the American Board of Clinical Lipidology Attending Cardiologist  Direct Dial: [phone]  Fax: [phone]  Website:  [email or web address] 05/29/2022, 1:39 PM

## 2022-06-08 DIAGNOSIS — L821 Other seborrheic keratosis: Secondary | ICD-10-CM | POA: Diagnosis not present

## 2022-06-08 DIAGNOSIS — D485 Neoplasm of uncertain behavior of skin: Secondary | ICD-10-CM | POA: Diagnosis not present

## 2022-06-08 DIAGNOSIS — D2272 Melanocytic nevi of left lower limb, including hip: Secondary | ICD-10-CM | POA: Diagnosis not present

## 2022-06-08 DIAGNOSIS — D2261 Melanocytic nevi of right upper limb, including shoulder: Secondary | ICD-10-CM | POA: Diagnosis not present

## 2022-06-08 DIAGNOSIS — Z85820 Personal history of malignant melanoma of skin: Secondary | ICD-10-CM | POA: Diagnosis not present

## 2022-06-08 DIAGNOSIS — Z85828 Personal history of other malignant neoplasm of skin: Secondary | ICD-10-CM | POA: Diagnosis not present

## 2022-06-08 DIAGNOSIS — D1801 Hemangioma of skin and subcutaneous tissue: Secondary | ICD-10-CM | POA: Diagnosis not present

## 2022-06-09 ENCOUNTER — Ambulatory Visit (INDEPENDENT_AMBULATORY_CARE_PROVIDER_SITE_OTHER): Payer: Medicare Other | Admitting: *Deleted

## 2022-06-09 DIAGNOSIS — I4891 Unspecified atrial fibrillation: Secondary | ICD-10-CM

## 2022-06-09 DIAGNOSIS — Z5181 Encounter for therapeutic drug level monitoring: Secondary | ICD-10-CM

## 2022-06-09 LAB — POCT INR: INR: 2.7 (ref 2.0–3.0)

## 2022-06-09 NOTE — Patient Instructions (Signed)
Continue warfarin 1/2 tablet daily except 1 tablet on Mondays, Wednesdays and Fridays  Recheck in 4 wks.   

## 2022-06-15 ENCOUNTER — Other Ambulatory Visit (HOSPITAL_COMMUNITY): Payer: Medicare Other

## 2022-06-21 DIAGNOSIS — H401131 Primary open-angle glaucoma, bilateral, mild stage: Secondary | ICD-10-CM | POA: Diagnosis not present

## 2022-06-21 DIAGNOSIS — M25561 Pain in right knee: Secondary | ICD-10-CM | POA: Diagnosis not present

## 2022-06-27 ENCOUNTER — Ambulatory Visit (HOSPITAL_COMMUNITY)
Admission: RE | Admit: 2022-06-27 | Discharge: 2022-06-27 | Disposition: A | Discharge status: Home / Self Care | Payer: Medicare Other | Source: Ambulatory Visit | Attending: Internal Medicine | Admitting: Internal Medicine

## 2022-06-27 DIAGNOSIS — I340 Nonrheumatic mitral (valve) insufficiency: Secondary | ICD-10-CM | POA: Insufficient documentation

## 2022-06-27 DIAGNOSIS — I361 Nonrheumatic tricuspid (valve) insufficiency: Secondary | ICD-10-CM | POA: Diagnosis not present

## 2022-06-27 LAB — ECHOCARDIOGRAM COMPLETE
Area-P 1/2: 6.71 cm2
MV M vel: 5.32 m/s
MV Peak grad: 113.2 mmHg
Radius: 0.4 cm
S' Lateral: 2.6 cm

## 2022-06-27 NOTE — Progress Notes (Signed)
*  PRELIMINARY RESULTS* Echocardiogram 2D Echocardiogram has been performed.  Kathleen Mcconnell 06/27/2022, 2:34 PM

## 2022-06-30 ENCOUNTER — Other Ambulatory Visit: Payer: Self-pay

## 2022-06-30 DIAGNOSIS — M25561 Pain in right knee: Secondary | ICD-10-CM | POA: Diagnosis not present

## 2022-06-30 DIAGNOSIS — M79604 Pain in right leg: Secondary | ICD-10-CM | POA: Diagnosis not present

## 2022-06-30 NOTE — Progress Notes (Signed)
Erroneous encounter

## 2022-07-06 ENCOUNTER — Ambulatory Visit (INDEPENDENT_AMBULATORY_CARE_PROVIDER_SITE_OTHER): Payer: Medicare Other | Admitting: *Deleted

## 2022-07-06 DIAGNOSIS — I4891 Unspecified atrial fibrillation: Secondary | ICD-10-CM | POA: Diagnosis not present

## 2022-07-06 DIAGNOSIS — Z5181 Encounter for therapeutic drug level monitoring: Secondary | ICD-10-CM

## 2022-07-06 LAB — POCT INR: INR: 2.6 (ref 2.0–3.0)

## 2022-07-06 NOTE — Patient Instructions (Signed)
Continue warfarin 1/2 tablet daily except 1 tablet on Mondays, Wednesdays and Fridays  Recheck in 5 wks.   

## 2022-07-20 DIAGNOSIS — M25561 Pain in right knee: Secondary | ICD-10-CM | POA: Diagnosis not present

## 2022-07-20 DIAGNOSIS — M25571 Pain in right ankle and joints of right foot: Secondary | ICD-10-CM | POA: Diagnosis not present

## 2022-08-02 DIAGNOSIS — H401131 Primary open-angle glaucoma, bilateral, mild stage: Secondary | ICD-10-CM | POA: Diagnosis not present

## 2022-08-05 ENCOUNTER — Other Ambulatory Visit: Payer: Self-pay | Admitting: Internal Medicine

## 2022-08-07 ENCOUNTER — Ambulatory Visit
Admission: EM | Admit: 2022-08-07 | Discharge: 2022-08-07 | Disposition: A | Discharge status: Home / Self Care | Payer: Medicare Other | Attending: Family Medicine | Admitting: Family Medicine

## 2022-08-07 DIAGNOSIS — R040 Epistaxis: Secondary | ICD-10-CM

## 2022-08-07 DIAGNOSIS — Z7901 Long term (current) use of anticoagulants: Secondary | ICD-10-CM

## 2022-08-07 MED ORDER — OXYMETAZOLINE HCL 0.05 % NA SOLN: 1.0000 | Freq: Two times a day (BID) | NASAL | Status: DC

## 2022-08-07 NOTE — ED Provider Notes (Signed)
[facility]    CSN: [id number] Arrival date & time: 08/07/22  1656      History   Chief Complaint Chief Complaint  Patient presents with   Epistaxis    HPI Kathleen Mcconnell is a 81 y.o. female.   Patient presenting today with a significant nosebleed that has been present for the past 4 to 5 hours.  She states she had a 10-minute episode last night, then constant bleeding started around 1 PM today seemingly without provocation.  She denies pain, headache, dizziness, chest pain, shortness of breath, palpitations, weakness, fatigue.  She states the bleeding is only in the right nostril.  Of note, she is on chronic Coumadin therapy.  Her granddaughter presents today with her.    Past Medical History:  Diagnosis Date   History of nuclear stress test 03/2012   lexiscan; normal pattern of perfusion; normal, low risk    Hyperlipidemia    Hypertension    Hypothyroidism    Mild aortic insufficiency    Mitral valve regurgitation    Persistent atrial fibrillation ([place])    History of; cardioversion in 2004   Severe tricuspid regurgitation    on echo 02/2011 ?rheumatic heart disease   Thyroid mass 10/10/2001   benign lesion removed    Patient Active Problem List   Diagnosis Date Noted   Encounter for therapeutic drug monitoring 11/06/2016   Bilateral lower extremity edema 07/13/2016   Shortness of breath 07/13/2016   TIA (transient ischemic attack) 03/15/2015   Hypothyroidism    Hyperlipidemia    Other specified hypothyroidism    Encounter for long-term (current) use of antiplatelets/antithrombotics 07/02/2014   Essential hypertension 02/27/2013   Tricuspid regurgitation 02/27/2013   Atrial fibrillation ([place]) 02/07/2013   Long term current use of anticoagulant therapy 02/07/2013   Special screening for malignant neoplasms, colon 12/20/2012    Past Surgical History:  Procedure Laterality Date   CARDIAC CATHETERIZATION  4268   normal systolic function, near normal  coronaries    CARDIOVERSION N/A 01/20/2013   Procedure: CARDIOVERSION;  Surgeon: Pixie Casino, MD;  Location: [facility];  Service: Cardiovascular;  Laterality: N/A;   THYROIDECTOMY  10/10/2001   TRANSTHORACIC ECHOCARDIOGRAM  02/2011   EF=>55%; mild MR; mod-severe TR & elevated RV systolic pressure, mild pulm HTN; aortic valve mildly sclerotic with mild regurg    OB History   No obstetric history on file.      Home Medications    Prior to Admission medications   Medication Sig Start Date End Date Taking? Authorizing Provider  acetaminophen (TYLENOL) 500 MG tablet Take 1,000 mg by mouth every 6 (six) hours as needed. For pain    [provider]  B Complex Vitamins (B COMPLEX 1 PO) Take 1 tablet by mouth daily.    [provider]  CALCIUM-VITAMIN D PO Take 1 tablet by mouth 2 (two) times daily.    [provider]  diltiazem (CARDIZEM CD) 240 MG 24 hr capsule TAKE (1) CAPSULE BY MOUTH ONCE DAILY. 05/22/22   Hilty, Nadean Corwin, MD  fish oil-omega-3 fatty acids 1000 MG capsule Take 1 g by mouth daily.    [provider]  furosemide (LASIX) 20 MG tablet TAKE ONE TABLET BY MOUTH ONCE DAILY AS NEEDED 05/22/22   Hilty, Nadean Corwin, MD  levothyroxine (SYNTHROID, LEVOTHROID) 88 MCG tablet Take 88 mcg by mouth daily.    [provider]  lisinopril (ZESTRIL) 20 MG tablet TAKE (1) TABLET BY MOUTH DAILY. 03/06/22  Pixie Casino, MD  metoprolol succinate (TOPROL-XL) 50 MG 24 hr tablet TAKE ONE TABLET BY MOUTH DAILY. 12/12/21   Hilty, Nadean Corwin, MD  Multiple Vitamin (MULTIVITAMIN WITH MINERALS) TABS Take 1 tablet by mouth daily.    [provider]  rosuvastatin (CRESTOR) 5 MG tablet TAKE ONE TABLET BY MOUTH DAILY. 03/06/22   Hilty, Nadean Corwin, MD  warfarin (COUMADIN) 6 MG tablet TAKE 1 TABLET DAILY EXCEPT 1/2 TABLET ON MONDAYS, WEDNESDAYS, AND FRIDAYS. 01/23/22   Hilty, Nadean Corwin, MD    Family History Family History  Problem Relation Age of Onset    Heart disease Father        also MI   Heart disease Brother        x 3, one deceased age 58   Heart disease Sister     Social History Social History   Tobacco Use   Smoking status: Never   Smokeless tobacco: Never  Substance Use Topics   Alcohol use: No   Drug use: No     Allergies   Aspirin   Review of Systems Review of Systems Per HPI  Physical Exam Triage Vital Signs ED Triage Vitals [08/07/22 1658]  Enc Vitals Group     BP (!) 149/77     Pulse Rate 69     Resp 20     Temp      Temp src      SpO2      Weight      Height      Head Circumference      Peak Flow      Pain Score 0     Pain Loc      Pain Edu?      Excl. in [place]?    No data found.  Updated Vital Signs BP (!) 149/77   Pulse 69   Resp 20   Visual Acuity Right Eye Distance:   Left Eye Distance:   Bilateral Distance:    Right Eye Near:   Left Eye Near:    Bilateral Near:     Physical Exam Vitals and nursing note reviewed.  Constitutional:      Appearance: Normal appearance. She is not ill-appearing.  HENT:     Head: Atraumatic.     Nose:     Comments: Right nare with dried blood, turbinates edematous, no obvious polyps or blockages the nasal passage.  Appears patent bilaterally.  Prior to time of discharge, bleeding has completely ceased    Mouth/Throat:     Mouth: Mucous membranes are moist.  Eyes:     Extraocular Movements: Extraocular movements intact.     Conjunctiva/sclera: Conjunctivae normal.  Cardiovascular:     Rate and Rhythm: Normal rate and regular rhythm.     Heart sounds: Normal heart sounds.  Pulmonary:     Effort: Pulmonary effort is normal.     Breath sounds: Normal breath sounds.  Musculoskeletal:        General: Normal range of motion.     Cervical back: Normal range of motion and neck supple.  Skin:    General: Skin is warm and dry.  Neurological:     Mental Status: She is alert and oriented to person, place, and time.  Psychiatric:        Mood and  Affect: Mood normal.        Thought Content: Thought content normal.        Judgment: Judgment normal.    UC Treatments /  Results  Labs (all labs ordered are listed, but only abnormal results are displayed) Labs Reviewed  CBC WITH DIFFERENTIAL/PLATELET    EKG   Radiology No results found.  Procedures Procedures (including critical care time)  Medications Ordered in UC Medications  oxymetazoline (AFRIN) 0.05 % nasal spray 1 spray (has no administration in time range)    Initial Impression / Assessment and Plan / UC Course  I have reviewed the triage vital signs and the nursing notes.  Pertinent labs & imaging results that were available during my care of the patient were reviewed by me and considered in my medical decision making (see chart for details).     2 squirts of Afrin were applied in triage to the right nostril for active bleeding x4 hours.  Packing was inserted and pressure applied for 30 minutes.  Bleeding completely controlled by the time provider came to examine patient and patient well-appearing, asymptomatic at this time.  Will obtain CBC, discussed if bleeding resumes to again use Afrin and pressure and if not resolving after 20 minutes or so to call a family number to go to the emergency department or call 911.  Also discussed red flag symptoms such as chest pain, shortness of breath, weakness, fatigue, dizziness.  Humidifier, Vaseline to nasal passage and avoidance of blowing nose or other irritating factors reviewed.  Final Clinical Impressions(s) / UC Diagnoses   Final diagnoses:  Epistaxis  Long term current use of anticoagulant therapy     Discharge Instructions      Use a humidifier in your bedroom overnight, you may apply a thin layer of Vaseline or Aquaphor gently to the inside of the nose to help protect the irritated skin.  Avoid blowing the nose or sneezing is much as possible.  If your nose starts bleeding again, you may try the Afrin and  apply pressure but if the bleeding continues past 20 minutes or so call 911 or have a family member taken to the emergency department.  We will let you know if your blood counts come back abnormal as soon as possible.  Follow-up with your primary care provider in the morning.    ED Prescriptions   None    PDMP not reviewed this encounter.   Volney American, [place] 08/07/22 [phone]

## 2022-08-07 NOTE — ED Triage Notes (Signed)
Pt presents with nosebleed that began 4-5 hrs ago , bleeding on right side only, pt on blood thinners

## 2022-08-07 NOTE — Discharge Instructions (Signed)
Use a humidifier in your bedroom overnight, you may apply a thin layer of Vaseline or Aquaphor gently to the inside of the nose to help protect the irritated skin.  Avoid blowing the nose or sneezing is much as possible.  If your nose starts bleeding again, you may try the Afrin and apply pressure but if the bleeding continues past 20 minutes or so call 911 or have a family member taken to the emergency department.  We will let you know if your blood counts come back abnormal as soon as possible.  Follow-up with your primary care provider in the morning.

## 2022-08-09 LAB — CBC WITH DIFFERENTIAL/PLATELET
Basophils Absolute: 0.1 10*3/uL (ref 0.0–0.2)
Basos: 1 %
EOS (ABSOLUTE): 0.1 10*3/uL (ref 0.0–0.4)
Eos: 1 %
Hematocrit: 43 % (ref 34.0–46.6)
Hemoglobin: 14.6 g/dL (ref 11.1–15.9)
Immature Grans (Abs): 0 10*3/uL (ref 0.0–0.1)
Immature Granulocytes: 0 %
Lymphocytes Absolute: 2.6 10*3/uL (ref 0.7–3.1)
Lymphs: 42 %
MCH: 33.5 pg — ABNORMAL HIGH (ref 26.6–33.0)
MCHC: 34 g/dL (ref 31.5–35.7)
MCV: 99 fL — ABNORMAL HIGH (ref 79–97)
Monocytes Absolute: 0.6 10*3/uL (ref 0.1–0.9)
Monocytes: 11 %
Neutrophils Absolute: 2.8 10*3/uL (ref 1.4–7.0)
Neutrophils: 45 %
Platelets: 208 10*3/uL (ref 150–450)
RBC: 4.36 x10E6/uL (ref 3.77–5.28)
RDW: 12.5 % (ref 11.7–15.4)
WBC: 6.1 10*3/uL (ref 3.4–10.8)

## 2022-08-10 ENCOUNTER — Ambulatory Visit: Payer: Medicare Other | Attending: Internal Medicine | Admitting: *Deleted

## 2022-08-10 DIAGNOSIS — I4891 Unspecified atrial fibrillation: Secondary | ICD-10-CM | POA: Diagnosis not present

## 2022-08-10 DIAGNOSIS — Z5181 Encounter for therapeutic drug level monitoring: Secondary | ICD-10-CM | POA: Diagnosis not present

## 2022-08-10 LAB — POCT INR: INR: 2.4 (ref 2.0–3.0)

## 2022-08-10 NOTE — Patient Instructions (Signed)
Continue warfarin 1/2 tablet daily except 1 tablet on Mondays, Wednesdays and Fridays Recheck in 6 wks 

## 2022-09-04 ENCOUNTER — Other Ambulatory Visit: Payer: Self-pay | Admitting: Internal Medicine

## 2022-09-21 ENCOUNTER — Ambulatory Visit: Payer: Medicare Other | Attending: Internal Medicine | Admitting: *Deleted

## 2022-09-21 DIAGNOSIS — I4891 Unspecified atrial fibrillation: Secondary | ICD-10-CM

## 2022-09-21 DIAGNOSIS — Z5181 Encounter for therapeutic drug level monitoring: Secondary | ICD-10-CM

## 2022-09-21 LAB — POCT INR: INR: 2.4 (ref 2.0–3.0)

## 2022-09-21 NOTE — Patient Instructions (Signed)
Continue warfarin 1/2 tablet daily except 1 tablet on Mondays, Wednesdays and Fridays Recheck in 6 wks 

## 2022-09-26 DIAGNOSIS — Z23 Encounter for immunization: Secondary | ICD-10-CM | POA: Diagnosis not present

## 2022-10-16 DIAGNOSIS — I4891 Unspecified atrial fibrillation: Secondary | ICD-10-CM | POA: Diagnosis not present

## 2022-10-16 DIAGNOSIS — I10 Essential (primary) hypertension: Secondary | ICD-10-CM | POA: Diagnosis not present

## 2022-10-16 DIAGNOSIS — E063 Autoimmune thyroiditis: Secondary | ICD-10-CM | POA: Diagnosis not present

## 2022-10-16 DIAGNOSIS — Z0001 Encounter for general adult medical examination with abnormal findings: Secondary | ICD-10-CM | POA: Diagnosis not present

## 2022-10-16 DIAGNOSIS — Z1331 Encounter for screening for depression: Secondary | ICD-10-CM | POA: Diagnosis not present

## 2022-10-16 DIAGNOSIS — Z681 Body mass index (BMI) 19 or less, adult: Secondary | ICD-10-CM | POA: Diagnosis not present

## 2022-10-20 DIAGNOSIS — I10 Essential (primary) hypertension: Secondary | ICD-10-CM | POA: Diagnosis not present

## 2022-10-20 DIAGNOSIS — Z0001 Encounter for general adult medical examination with abnormal findings: Secondary | ICD-10-CM | POA: Diagnosis not present

## 2022-10-20 DIAGNOSIS — I4891 Unspecified atrial fibrillation: Secondary | ICD-10-CM | POA: Diagnosis not present

## 2022-10-20 DIAGNOSIS — E063 Autoimmune thyroiditis: Secondary | ICD-10-CM | POA: Diagnosis not present

## 2022-11-02 ENCOUNTER — Ambulatory Visit: Payer: Medicare Other | Attending: Internal Medicine | Admitting: *Deleted

## 2022-11-02 DIAGNOSIS — Z5181 Encounter for therapeutic drug level monitoring: Secondary | ICD-10-CM

## 2022-11-02 DIAGNOSIS — I4891 Unspecified atrial fibrillation: Secondary | ICD-10-CM | POA: Diagnosis not present

## 2022-11-02 LAB — POCT INR: INR: 2.4 (ref 2.0–3.0)

## 2022-11-02 NOTE — Patient Instructions (Signed)
Continue warfarin 1/2 tablet daily except 1 tablet on Mondays, Wednesdays and Fridays Recheck in 6 wks

## 2022-11-17 ENCOUNTER — Other Ambulatory Visit: Payer: Self-pay | Admitting: Internal Medicine

## 2022-12-02 ENCOUNTER — Other Ambulatory Visit: Payer: Self-pay | Admitting: Internal Medicine

## 2022-12-14 ENCOUNTER — Ambulatory Visit: Payer: Medicare Other | Attending: Internal Medicine | Admitting: *Deleted

## 2022-12-14 DIAGNOSIS — Z5181 Encounter for therapeutic drug level monitoring: Secondary | ICD-10-CM

## 2022-12-14 DIAGNOSIS — I4891 Unspecified atrial fibrillation: Secondary | ICD-10-CM

## 2022-12-14 LAB — POCT INR: INR: 2.1 (ref 2.0–3.0)

## 2022-12-14 NOTE — Patient Instructions (Signed)
Continue warfarin 1/2 tablet daily except 1 tablet on Mondays, Wednesdays and Fridays Recheck in 6 wks

## 2023-02-01 ENCOUNTER — Ambulatory Visit: Payer: Medicare Other | Attending: Internal Medicine | Admitting: *Deleted

## 2023-02-01 DIAGNOSIS — Z5181 Encounter for therapeutic drug level monitoring: Secondary | ICD-10-CM | POA: Diagnosis not present

## 2023-02-01 DIAGNOSIS — I4891 Unspecified atrial fibrillation: Secondary | ICD-10-CM | POA: Insufficient documentation

## 2023-02-01 LAB — POCT INR: POC INR: 2.6

## 2023-02-01 NOTE — Patient Instructions (Signed)
Description   Continue warfarin 1/2 tablet daily except 1 tablet on Mondays, Wednesdays and Fridays Recheck in 6 wks

## 2023-02-09 DIAGNOSIS — H401131 Primary open-angle glaucoma, bilateral, mild stage: Secondary | ICD-10-CM | POA: Diagnosis not present

## 2023-02-26 ENCOUNTER — Other Ambulatory Visit: Payer: Self-pay | Admitting: Internal Medicine

## 2023-02-26 DIAGNOSIS — I4891 Unspecified atrial fibrillation: Secondary | ICD-10-CM

## 2023-03-14 ENCOUNTER — Encounter (HOSPITAL_BASED_OUTPATIENT_CLINIC_OR_DEPARTMENT_OTHER): Payer: Self-pay | Admitting: Internal Medicine

## 2023-03-15 ENCOUNTER — Ambulatory Visit: Payer: Medicare Other | Attending: Internal Medicine | Admitting: *Deleted

## 2023-03-15 DIAGNOSIS — Z5181 Encounter for therapeutic drug level monitoring: Secondary | ICD-10-CM | POA: Insufficient documentation

## 2023-03-15 DIAGNOSIS — I4891 Unspecified atrial fibrillation: Secondary | ICD-10-CM | POA: Diagnosis not present

## 2023-03-15 LAB — POCT INR: INR: 3.1 — AB (ref 2.0–3.0)

## 2023-03-15 NOTE — Patient Instructions (Signed)
Continue warfarin 1/2 tablet daily except 1 tablet on Mondays, Wednesdays and Fridays Eat extra greens/salad today Recheck in 6 wks

## 2023-04-13 ENCOUNTER — Encounter: Payer: Self-pay | Admitting: Family Medicine

## 2023-04-13 ENCOUNTER — Ambulatory Visit (INDEPENDENT_AMBULATORY_CARE_PROVIDER_SITE_OTHER): Payer: Medicare Other | Admitting: Family Medicine

## 2023-04-13 VITALS — BP 140/90 | HR 69 | Temp 97.9°F | Ht 63.0 in | Wt 105.6 lb

## 2023-04-13 DIAGNOSIS — E038 Other specified hypothyroidism: Secondary | ICD-10-CM | POA: Diagnosis not present

## 2023-04-13 DIAGNOSIS — E782 Mixed hyperlipidemia: Secondary | ICD-10-CM

## 2023-04-13 DIAGNOSIS — I340 Nonrheumatic mitral (valve) insufficiency: Secondary | ICD-10-CM

## 2023-04-13 DIAGNOSIS — G629 Polyneuropathy, unspecified: Secondary | ICD-10-CM

## 2023-04-13 DIAGNOSIS — I482 Chronic atrial fibrillation, unspecified: Secondary | ICD-10-CM | POA: Diagnosis not present

## 2023-04-13 DIAGNOSIS — I10 Essential (primary) hypertension: Secondary | ICD-10-CM | POA: Diagnosis not present

## 2023-04-13 DIAGNOSIS — I071 Rheumatic tricuspid insufficiency: Secondary | ICD-10-CM

## 2023-04-13 DIAGNOSIS — Z23 Encounter for immunization: Secondary | ICD-10-CM

## 2023-04-13 NOTE — Progress Notes (Signed)
Subjective:    Patient ID: Kathleen Mcconnell, female    DOB: 03/23/1941, 82 y.o.   MRN: [id number]  HPI  Has a history of moderate to severe mitral and tricuspid regurgitation.  She is being followed annually by her cardiologist Dr. Rennis Golden.  She is on Coumadin for ambulation.  She gets her INR checked in 8.  She tried Xarelto in the past and had a "bad reaction to it".  She prefers to stay on Coumadin.  She denies any chest pain shortness of breath or dyspnea on exertion.  Blood pressure today is acceptable at 140/90.  She is asymptomatic when it comes to her mitral and tricuspid regurgitation.  Specifically she denies any dyspnea on exertion, orthopnea, or peripheral edema.  Her mammogram was performed elsewhere.  Due to her age she does not require colonoscopy or Pap smear.  She states that she had Prevnar 20, Pneumovax 23, and she declines a shingles shot today Past Medical History:  Diagnosis Date   History of nuclear stress test 03/2012   lexiscan; normal pattern of perfusion; normal, low risk    Hyperlipidemia    Hypertension    Hypothyroidism    Mild aortic insufficiency    Mitral valve regurgitation    Persistent atrial fibrillation (HCC)    History of; cardioversion in 2004   Severe tricuspid regurgitation    on echo 02/2011 ?rheumatic heart disease   Thyroid mass 10/10/2001   benign lesion removed   Past Surgical History:  Procedure Laterality Date   CARDIAC CATHETERIZATION  2003   normal systolic function, near normal coronaries    CARDIOVERSION N/A 01/20/2013   Procedure: CARDIOVERSION;  Surgeon: Chrystie Nose, MD;  Location: [facility] ENDOSCOPY;  Service: Cardiovascular;  Laterality: N/A;   THYROIDECTOMY  10/10/2001   TRANSTHORACIC ECHOCARDIOGRAM  02/2011   EF=>55%; mild MR; mod-severe TR & elevated RV systolic pressure, mild pulm HTN; aortic valve mildly sclerotic with mild regurg   Current Outpatient Medications on File Prior to Visit  Medication Sig Dispense Refill    acetaminophen (TYLENOL) 500 MG tablet Take 1,000 mg by mouth every 6 (six) hours as needed. For pain     B Complex Vitamins (B COMPLEX 1 PO) Take 1 tablet by mouth daily.     CALCIUM-VITAMIN D PO Take 1 tablet by mouth 2 (two) times daily.     diltiazem (CARDIZEM CD) 240 MG 24 hr capsule TAKE (1) CAPSULE BY MOUTH ONCE DAILY. 90 capsule 3   fish oil-omega-3 fatty acids 1000 MG capsule Take 1 g by mouth daily.     furosemide (LASIX) 20 MG tablet TAKE (1) TABLET BY MOUTH ONCE DAILY AS NEEDED 90 tablet 3   latanoprost (XALATAN) 0.005 % ophthalmic solution 1 drop at bedtime.     levothyroxine (SYNTHROID, LEVOTHROID) 88 MCG tablet Take 88 mcg by mouth daily.     lisinopril (ZESTRIL) 20 MG tablet TAKE (1) TABLET BY MOUTH DAILY. 90 tablet 2   metoprolol succinate (TOPROL-XL) 50 MG 24 hr tablet TAKE ONE TABLET BY MOUTH DAILY. 90 tablet 2   Multiple Vitamin (MULTIVITAMIN WITH MINERALS) TABS Take 1 tablet by mouth daily.     rosuvastatin (CRESTOR) 5 MG tablet TAKE ONE TABLET BY MOUTH DAILY. 90 tablet 2   warfarin (COUMADIN) 6 MG tablet TAKE 1 TABLET DAILY EXCEPT 1/2 TABLET ON MONDAYS, WEDNESDAYS, AND FRIDAYS. 100 tablet 0   No current facility-administered medications on file prior to visit.   Allergies  Allergen Reactions   Aspirin  Anaphylaxis, Hives, Rash and Other (See Comments)    As well as short of breath; Previously was rushed to hospital-given epinephrine injection.    Social History   Socioeconomic History   Marital status: Married    Spouse name: Not on file   Number of children: 2   Years of education: Not on file   Highest education level: Not on file  Occupational History   Occupation: retired  Tobacco Use   Smoking status: Never   Smokeless tobacco: Never  Substance and Sexual Activity   Alcohol use: No   Drug use: No   Sexual activity: Not on file  Other Topics Concern   Not on file  Social History Narrative   Pt lives in [place] with spouse.  Retired Catering manager.    Social Determinants of Health   Financial Resource Strain: Not on file  Food Insecurity: Not on file  Transportation Needs: Not on file  Physical Activity: Not on file  Stress: Not on file  Social Connections: Not on file  Intimate Partner Violence: Not on file    Family History  Problem Relation Age of Onset   Heart disease Father        also MI   Heart disease Brother        x 3, one deceased age 63   Heart disease Sister     Review of Systems  All other systems reviewed and are negative.      Objective:   Physical Exam Vitals reviewed.  Constitutional:      General: She is not in acute distress.    Appearance: Normal appearance. She is normal weight. She is not ill-appearing, toxic-appearing or diaphoretic.  HENT:     Head: Normocephalic and atraumatic.     Nose: Nose normal. No congestion or rhinorrhea.     Mouth/Throat:     Mouth: Mucous membranes are moist.     Pharynx: Oropharynx is clear. No oropharyngeal exudate or posterior oropharyngeal erythema.  Eyes:     General: No scleral icterus.    Extraocular Movements: Extraocular movements intact.     Conjunctiva/sclera: Conjunctivae normal.     Pupils: Pupils are equal, round, and reactive to light.  Neck:     Vascular: No carotid bruit.  Cardiovascular:     Rate and Rhythm: Normal rate. Rhythm irregular.     Pulses: Normal pulses.     Heart sounds: Murmur heard.     No gallop.  Pulmonary:     Effort: Pulmonary effort is normal. No respiratory distress.     Breath sounds: Normal breath sounds. No wheezing, rhonchi or rales.  Abdominal:     General: Abdomen is flat. Bowel sounds are normal. There is no distension.     Palpations: Abdomen is soft.     Tenderness: There is no abdominal tenderness. There is no guarding or rebound.  Musculoskeletal:     Cervical back: Normal range of motion. No rigidity.     Right lower leg: No edema.     Left lower leg: No edema.  Lymphadenopathy:     Cervical: No  cervical adenopathy.  Skin:    General: Skin is warm.     Coloration: Skin is not jaundiced.     Findings: No bruising, erythema, lesion or rash.  Neurological:     General: No focal deficit present.     Mental Status: She is alert and oriented to person, place, and time. Mental status is at baseline.  Deep Tendon Reflexes: Reflexes normal.  Psychiatric:        Mood and Affect: Mood normal.        Behavior: Behavior normal.        Thought Content: Thought content normal.        Judgment: Judgment normal.           Assessment & Plan:   Chronic atrial fibrillation (HCC) - Plan: CBC with Differential/Platelet, COMPLETE METABOLIC PANEL WITH GFR, Lipid panel, TSH  Essential hypertension - Plan: CBC with Differential/Platelet, COMPLETE METABOLIC PANEL WITH GFR, Lipid panel, TSH  Other specified hypothyroidism - Plan: CBC with Differential/Platelet, COMPLETE METABOLIC PANEL WITH GFR, Lipid panel, TSH  Mixed hyperlipidemia - Plan: CBC with Differential/Platelet, COMPLETE METABOLIC PANEL WITH GFR, Lipid panel, TSH  Tricuspid valve insufficiency, unspecified etiology  Mitral valve insufficiency, unspecified etiology  Neuropathy - Plan: Vitamin B12 I do believe the patient has neuropathy.  She does complain of some numbness and tingling in her feet.  On exam she has weakly palpable pulses bilaterally.  She also has a pressure callus forming on the plantar surface of her right great toe.  I recommended that she wear shoes with padding to offload the pressure there.  Also recommended that she monitor that area.  I will check a CBC CMP lipid panel.  Given her history of hypothyroidism we will check a TSH.  I will also check a vitamin B12 given her history of neuropathy.  Defer her INR to the ED Coumadin clinic.  Dr. Rennis Golden is monitoring her mitral and tricuspid regurgitation.  Plan to recheck the patient every 6 months

## 2023-04-14 LAB — TSH: TSH: 1.66 mIU/L (ref 0.40–4.50)

## 2023-04-14 LAB — CBC WITH DIFFERENTIAL/PLATELET
Absolute Monocytes: 679 cells/uL (ref 200–950)
Basophils Absolute: 58 cells/uL (ref 0–200)
Basophils Relative: 0.8 %
Eosinophils Absolute: 110 cells/uL (ref 15–500)
Eosinophils Relative: 1.5 %
HCT: 43.6 % (ref 35.0–45.0)
Hemoglobin: 15 g/dL (ref 11.7–15.5)
Lymphs Abs: 2898 cells/uL (ref 850–3900)
MCH: 33.3 pg — ABNORMAL HIGH (ref 27.0–33.0)
MCHC: 34.4 g/dL (ref 32.0–36.0)
MCV: 96.7 fL (ref 80.0–100.0)
MPV: 9.9 fL (ref 7.5–12.5)
Monocytes Relative: 9.3 %
Neutro Abs: 3555 cells/uL (ref 1500–7800)
Neutrophils Relative %: 48.7 %
Platelets: 199 10*3/uL (ref 140–400)
RBC: 4.51 10*6/uL (ref 3.80–5.10)
RDW: 11.6 % (ref 11.0–15.0)
Total Lymphocyte: 39.7 %
WBC: 7.3 10*3/uL (ref 3.8–10.8)

## 2023-04-14 LAB — COMPLETE METABOLIC PANEL WITH GFR
AG Ratio: 1.8 (calc) (ref 1.0–2.5)
ALT: 19 U/L (ref 6–29)
AST: 34 U/L (ref 10–35)
Albumin: 4.6 g/dL (ref 3.6–5.1)
Alkaline phosphatase (APISO): 58 U/L (ref 37–153)
BUN: 23 mg/dL (ref 7–25)
CO2: 28 mmol/L (ref 20–32)
Calcium: 9.8 mg/dL (ref 8.6–10.4)
Chloride: 102 mmol/L (ref 98–110)
Creat: 0.8 mg/dL (ref 0.60–0.95)
Globulin: 2.6 g/dL (calc) (ref 1.9–3.7)
Glucose, Bld: 82 mg/dL (ref 65–99)
Potassium: 4.4 mmol/L (ref 3.5–5.3)
Sodium: 142 mmol/L (ref 135–146)
Total Bilirubin: 0.9 mg/dL (ref 0.2–1.2)
Total Protein: 7.2 g/dL (ref 6.1–8.1)
eGFR: 73 mL/min/{1.73_m2} (ref >=60)

## 2023-04-14 LAB — LIPID PANEL
Cholesterol: 163 mg/dL (ref <200)
HDL: 83 mg/dL (ref >=50)
LDL Cholesterol (Calc): 60 mg/dL (calc)
Non-HDL Cholesterol (Calc): 80 mg/dL (calc) (ref <130)
Total CHOL/HDL Ratio: 2 (calc) (ref <5.0)
Triglycerides: 113 mg/dL (ref <150)

## 2023-04-14 LAB — VITAMIN B12: Vitamin B-12: 976 pg/mL (ref 200–1100)

## 2023-04-16 ENCOUNTER — Other Ambulatory Visit (INDEPENDENT_AMBULATORY_CARE_PROVIDER_SITE_OTHER): Payer: Self-pay

## 2023-04-16 DIAGNOSIS — Z23 Encounter for immunization: Secondary | ICD-10-CM

## 2023-04-23 ENCOUNTER — Ambulatory Visit (INDEPENDENT_AMBULATORY_CARE_PROVIDER_SITE_OTHER): Payer: Medicare Other | Admitting: Family Medicine

## 2023-04-23 ENCOUNTER — Encounter: Payer: Self-pay | Admitting: Family Medicine

## 2023-04-23 VITALS — BP 130/80 | HR 74 | Temp 98.9°F | Ht 63.0 in | Wt 104.0 lb

## 2023-04-23 DIAGNOSIS — J069 Acute upper respiratory infection, unspecified: Secondary | ICD-10-CM | POA: Diagnosis not present

## 2023-04-23 MED ORDER — BENZONATATE 200 MG PO CAPS: 200.0000 mg | ORAL_CAPSULE | Freq: Two times a day (BID) | ORAL | 0 refills | Status: DC | PRN

## 2023-04-23 NOTE — Patient Instructions (Signed)

## 2023-04-23 NOTE — Progress Notes (Signed)
Acute Office Visit  Subjective:     Patient ID: Kathleen Mcconnell, female    DOB: Aug 02, 1941, 82 y.o.   MRN: [id number]  Chief Complaint  Patient presents with   Follow-up    coughing/sneezing/nauseated     HPI Patient is in today for 3 days of dry cough that is keeping her up at night and causing fatigue and body aches, sneezing, nausea, clear runny nose, and a scratchy throat Denies fever, chills, headache, ear ache No known sick exposures and no home covid test Has tried Tylenol and Benadryl  Review of Systems  All other systems reviewed and are negative.   Past Medical History:  Diagnosis Date   History of nuclear stress test 03/2012   lexiscan; normal pattern of perfusion; normal, low risk    Hyperlipidemia    Hypertension    Hypothyroidism    Mild aortic insufficiency    Mitral valve regurgitation    Persistent atrial fibrillation (HCC)    History of; cardioversion in 2004   Severe tricuspid regurgitation    on echo 02/2011 ?rheumatic heart disease   Thyroid mass 10/10/2001   benign lesion removed   Past Surgical History:  Procedure Laterality Date   BREAST SURGERY     CARDIAC CATHETERIZATION  12/04/2001   normal systolic function, near normal coronaries    CARDIOVERSION N/A 01/20/2013   Procedure: CARDIOVERSION;  Surgeon: Chrystie Nose, MD;  Location: [facility] ENDOSCOPY;  Service: Cardiovascular;  Laterality: N/A;   THYROIDECTOMY  10/10/2001   TRANSTHORACIC ECHOCARDIOGRAM  02/02/2011   EF=>55%; mild MR; mod-severe TR & elevated RV systolic pressure, mild pulm HTN; aortic valve mildly sclerotic with mild regurg   Current Outpatient Medications on File Prior to Visit  Medication Sig Dispense Refill   acetaminophen (TYLENOL) 500 MG tablet Take 1,000 mg by mouth every 6 (six) hours as needed. For pain     B Complex Vitamins (B COMPLEX 1 PO) Take 1 tablet by mouth daily.     CALCIUM-VITAMIN D PO Take 1 tablet by mouth 2 (two) times daily.     diltiazem (CARDIZEM  CD) 240 MG 24 hr capsule TAKE (1) CAPSULE BY MOUTH ONCE DAILY. 90 capsule 3   fish oil-omega-3 fatty acids 1000 MG capsule Take 1 g by mouth daily.     furosemide (LASIX) 20 MG tablet TAKE (1) TABLET BY MOUTH ONCE DAILY AS NEEDED 90 tablet 3   latanoprost (XALATAN) 0.005 % ophthalmic solution 1 drop at bedtime.     levothyroxine (SYNTHROID, LEVOTHROID) 88 MCG tablet Take 88 mcg by mouth daily.     lisinopril (ZESTRIL) 20 MG tablet TAKE (1) TABLET BY MOUTH DAILY. 90 tablet 2   metoprolol succinate (TOPROL-XL) 50 MG 24 hr tablet TAKE ONE TABLET BY MOUTH DAILY. 90 tablet 2   Multiple Vitamin (MULTIVITAMIN WITH MINERALS) TABS Take 1 tablet by mouth daily.     rosuvastatin (CRESTOR) 5 MG tablet TAKE ONE TABLET BY MOUTH DAILY. 90 tablet 2   warfarin (COUMADIN) 6 MG tablet TAKE 1 TABLET DAILY EXCEPT 1/2 TABLET ON MONDAYS, WEDNESDAYS, AND FRIDAYS. 100 tablet 0   No current facility-administered medications on file prior to visit.   Allergies  Allergen Reactions   Aspirin Anaphylaxis, Hives, Rash and Other (See Comments)    As well as short of breath; Previously was rushed to hospital-given epinephrine injection.        Objective:    BP 130/80   Pulse 74   Temp 98.9 F (37.2 C) (  Oral)   Ht 5\' 3"  (1.6 m)   Wt 104 lb (47.2 kg)   SpO2 93%   BMI 18.42 kg/m    Physical Exam Vitals and nursing note reviewed.  Constitutional:      Appearance: Normal appearance. She is normal weight.  HENT:     Head: Normocephalic and atraumatic.     Nose: Rhinorrhea present.     Mouth/Throat:     Pharynx: Oropharynx is clear.  Eyes:     Conjunctiva/sclera: Conjunctivae normal.  Cardiovascular:     Rate and Rhythm: Normal rate. Rhythm irregular.     Pulses: Normal pulses.     Heart sounds: Murmur heard.  Pulmonary:     Effort: Pulmonary effort is normal.     Breath sounds: Normal breath sounds.  Musculoskeletal:     Cervical back: No tenderness.  Lymphadenopathy:     Cervical: No cervical  adenopathy.  Skin:    General: Skin is warm and dry.  Neurological:     General: No focal deficit present.     Mental Status: She is alert and oriented to person, place, and time. Mental status is at baseline.  Psychiatric:        Mood and Affect: Mood normal.        Behavior: Behavior normal.        Thought Content: Thought content normal.        Judgment: Judgment normal.     No results found for any visits on 04/23/23.      Assessment & Plan:   Problem List Items Addressed This Visit     Viral URI with cough - Primary    Reassured patient that symptoms and exam findings are most consistent with a viral upper respiratory infection and explained lack of efficacy of antibiotics against viruses.  Discussed expected course and features suggestive of secondary bacterial infection.  Continue supportive care. Increase fluid intake with water or electrolyte solution like pedialyte. Encouraged acetaminophen as needed for fever/pain. Encouraged salt water gargling, chloraseptic spray and throat lozenges. Encouraged OTC guaifenesin. Encouraged saline sinus flushes and/or neti with humidified air.         Meds ordered this encounter  Medications   benzonatate (TESSALON) 200 MG capsule    Sig: Take 1 capsule (200 mg total) by mouth 2 (two) times daily as needed for cough.    Dispense:  20 capsule    Refill:  0    Order Specific Question:   Supervising Provider    Answer:   Lynnea Ferrier T [[id number]]    Return if symptoms worsen or fail to improve.  Park Meo, FNP

## 2023-04-23 NOTE — Assessment & Plan Note (Signed)

## 2023-04-24 ENCOUNTER — Other Ambulatory Visit: Payer: Self-pay | Admitting: Family Medicine

## 2023-04-24 ENCOUNTER — Telehealth: Payer: Self-pay

## 2023-04-24 NOTE — Telephone Encounter (Signed)
Pt's daughter,Tammy, called stated that the abx is not helping with the cough. Pt is also feeling nauseated and not sure if its due to her coughing but pt is just feeling good at all.   Is there anything else that you can Rx? Pls advice

## 2023-04-24 NOTE — Telephone Encounter (Signed)
Pt's daughter Babette Relic; who is on DPR, called and asked if something different can be sent in for patient to help with cough? Pt advised Tammy that she tried the [employer] but she "coughed all night." Thanks.

## 2023-04-26 ENCOUNTER — Encounter: Payer: Self-pay | Admitting: Internal Medicine

## 2023-04-27 ENCOUNTER — Ambulatory Visit: Payer: Medicare Other | Admitting: Internal Medicine

## 2023-04-29 DIAGNOSIS — N393 Stress incontinence (female) (male): Secondary | ICD-10-CM | POA: Diagnosis not present

## 2023-04-29 DIAGNOSIS — R030 Elevated blood-pressure reading, without diagnosis of hypertension: Secondary | ICD-10-CM | POA: Diagnosis not present

## 2023-04-29 DIAGNOSIS — Z681 Body mass index (BMI) 19 or less, adult: Secondary | ICD-10-CM | POA: Diagnosis not present

## 2023-04-29 DIAGNOSIS — J209 Acute bronchitis, unspecified: Secondary | ICD-10-CM | POA: Diagnosis not present

## 2023-05-02 ENCOUNTER — Ambulatory Visit: Payer: Medicare Other | Attending: Internal Medicine | Admitting: *Deleted

## 2023-05-02 DIAGNOSIS — Z5181 Encounter for therapeutic drug level monitoring: Secondary | ICD-10-CM | POA: Insufficient documentation

## 2023-05-02 DIAGNOSIS — I4891 Unspecified atrial fibrillation: Secondary | ICD-10-CM | POA: Insufficient documentation

## 2023-05-02 LAB — POCT INR: INR: 4.6 — AB (ref 2.0–3.0)

## 2023-05-02 NOTE — Patient Instructions (Signed)
Has bronchitis.  Took 1 pill of z-pak.  Is on Prednisone 20mg  twice daily for 5 days.  Will finish 5/31.  Had cortisone shot 5/26. Hold warfarin tonight and tomorrow night then resume 1/2 tablet daily except 1 tablet on Mondays, Wednesdays and Fridays Eat extra greens/salad today Recheck in 10 days

## 2023-05-14 ENCOUNTER — Ambulatory Visit: Payer: Medicare Other | Attending: Internal Medicine | Admitting: *Deleted

## 2023-05-14 DIAGNOSIS — I4891 Unspecified atrial fibrillation: Secondary | ICD-10-CM | POA: Diagnosis not present

## 2023-05-14 DIAGNOSIS — Z5181 Encounter for therapeutic drug level monitoring: Secondary | ICD-10-CM | POA: Insufficient documentation

## 2023-05-14 LAB — POCT INR: INR: 1.9 — AB (ref 2.0–3.0)

## 2023-05-14 NOTE — Patient Instructions (Signed)
Continue warfarin 1/2 tablet daily except 1 tablet on Mondays, Wednesdays and Fridays Continue greens/salad Recheck in 3 wks

## 2023-05-29 ENCOUNTER — Telehealth: Payer: Self-pay | Admitting: Internal Medicine

## 2023-05-29 NOTE — Telephone Encounter (Signed)
Spoke with Tammy.  Pt needs to reschedule INR.  Going on vacation.  Appt moved to 06/12/23.

## 2023-05-29 NOTE — Telephone Encounter (Signed)
Pt's daughter Babette Relic is requesting a callback from [facility] regarding coumadin appt on 06/04/2023 being canceled and to see if pt can have INR checked at her office visit with MD on 7/5 for her office visit. Please advise

## 2023-06-08 ENCOUNTER — Encounter: Payer: Self-pay | Admitting: Internal Medicine

## 2023-06-08 ENCOUNTER — Ambulatory Visit: Payer: Medicare Other | Attending: Internal Medicine | Admitting: Internal Medicine

## 2023-06-08 VITALS — BP 124/76 | HR 73 | Ht 62.0 in | Wt 107.6 lb

## 2023-06-08 DIAGNOSIS — I361 Nonrheumatic tricuspid (valve) insufficiency: Secondary | ICD-10-CM | POA: Diagnosis not present

## 2023-06-08 DIAGNOSIS — I4821 Permanent atrial fibrillation: Secondary | ICD-10-CM | POA: Insufficient documentation

## 2023-06-08 DIAGNOSIS — Z5181 Encounter for therapeutic drug level monitoring: Secondary | ICD-10-CM

## 2023-06-08 DIAGNOSIS — I340 Nonrheumatic mitral (valve) insufficiency: Secondary | ICD-10-CM | POA: Insufficient documentation

## 2023-06-08 NOTE — Progress Notes (Signed)
OFFICE NOTE  Chief Complaint: Routine follow-up  Primary Care Physician: Donita Brooks, MD  HPI:  Kathleen Mcconnell is a 82 year old female with a history of paroxysmal atrial fibrillation, on sotalol, who had a cardioversion in 2004 and had not had recurrence until recently. She also has signs of rheumatic heart disease on her echocardiogram with some sclerosis of the aortic valve, and moderate to severe TR and mild MR with a flat coaptation on the mitral valve. In addition, her EF is greater than 55% and she has normal atrial sizes. Recently, she had presented to the office for an annual followup and was found to be in atrial fibrillation with controlled ventricular response. She was clearly unaware that she was in atrial fibrillation; however, she did note that she was getting a little more short of breath when walking up stairs and had been a little bit more fatigued recently. She has been maintained on warfarin and has done fairly well with maintaining a therapeutic INR. She is also on diltiazem additionally for rate control and also for hypertension. I, therefore, recommended increasing her sotalol to 120 mg twice daily and set her up for a cardioversion. She underwent cardioversion on November 19, 2012, with a single 150-joule biphasic shock. She converted to a sinus rhythm and afterwards felt very well. This persisted for several weeks until recently; she started to feel tired again, not nearly as good as shortly after the procedure was performed. On followup visit, she was noted again to be in atrial fibrillation, now with slow ventricular response at a rate of 56. She claimed to be fairly symptomatic and therefore refer her to see Dr. Johney Frame with cardiac electrophysiology. He evaluated her and felt that due to her severe tricuspid regurgitation, she was not a good candidate for atrial fibrillation ablation, as well as the fact that she is not symptomatic.  He also recommended discontinuing  sotalol and pursuing rate control. She has since transitioned over to her warfarin checks with  in [place].  Kathleen Mcconnell returns today for follow-up. She is noted to be in A. fib with mild rapid ventricular response today and rates in the low 100s. There is a question about her being on both ACE and ARB which was started over 10 years ago. Although this is not current practice I have continued on this medicine because he seems to be doing well, however it is somewhat redundant. She denies any chest pain or worsening shortness of breath. I do believe she could have better rate control.  I saw Kathleen Mcconnell back today in the office for follow-up. Overall she's feeling well. She remains rate controlled in permanent A. fib. Her INRs have been therapeutic except for one short period this summer. Apparently she was seen in the ER for what was thought to be a TIA event. Her INR was slightly low however cardio workup was negative. She ultimately was reestablished to a therapeutic level of warfarin and has done well since then.  07/13/2016  Kathleen Mcconnell returns today for follow-up. She seems to be doing fairly well. She's had no further TIA events. She is accompanied by her friend today who reports that she has noted that Kathleen Mcconnell has had more shortness of breath recently. Particularly when walking up stairs. She's also had lower extremity swelling and some discoloration of her ankles. She is concerned about heart failure. Blood pressure is mildly elevated today however she found her dog dead this morning. Understandably her blood pressure  would be elevated. She denies any chest pain.  08/25/2016  Kathleen Mcconnell returns today for follow-up. She reports her swelling has significantly improved, however, she never got her compression stockings. Echo shows EF 55-60% with severe biatrial enlargement (maybe consistent with permanent a-fib) and mild to moderate MR and moderate TR with RVSP of 33 mmHg. Although she has been  compliant with warfarin and INR has been fairly well managed, it is likely that she will have greater benefit and lower bleeding risk on DOAC such as Eliquis. We discussed switching today. She was previously on Xarelto before warfarin, however, thought she may have had side-effects. Turned out not to be the medication.  02/22/2017  Kathleen Mcconnell seen today in follow-up. Overall she's had improvement in swelling. She's taking her Lasix only about 4 days a week. She reported that she felt dizzy or unwell on Eliquis. Is not quite clear but she took it for 3 months and then transition back to warfarin. Unfortunately in January this year she had a UTI and was placed on Bactrim which significantly elevated her INR to almost 15. Subsequent to that she was referred to the hospital but did not have any significant or life-threatening bleeding. INR since been fairly well-regulated and it was 3.2 on March 14. Blood pressure is well-controlled today 124/76. She reports persistent shortness of breath which comes and goes when walking up stairs, something she does on a daily basis.  05/06/2018  Kathleen Mcconnell is seen today in follow-up.  She denies any chest pain or worsening shortness of breath.  Her A. fib rate is controlled today.  Recently she is been very stable with her INR.  She says she has not had blood work possibly in the last year.    05/19/2019  Kathleen Mcconnell is seen today in follow-up.  This is a routine annual visit.  She denies any chest pain or worsening shortness of breath.  She is in persistent A. fib but is rate controlled and unaware of it.  Her INRs have been stable being checked every 4 to 6 weeks.  She is not had any recent blood work or follow-up with her PCP.  Blood pressure was slightly elevated today however reportedly is better at home.  05/13/2020  Kathleen Mcconnell returns today for follow-up.  She continues to do well without any symptoms.  She is not had any worsening shortness of breath and notes that her lower extremity  edema is essentially resolved.  She has not needed additional Lasix.  Her last echo was in 2017 which did show some mild to moderate MR and moderate TR but this has not been reassessed.  She has had therapeutic INRs.  She has not had any significant lab work except for check a TSH by her PCP.  05/24/2021  Kathleen Mcconnell returns today for follow-up.  In December she had an echocardiogram which showed worsening mitral and tricuspid regurgitation now moderate and severe respectively.  She also had elevated BNP.  She was more short of breath.  I advised her to start taking her Lasix 20 mg daily.  Since then she has had improvement in her shortness of breath and her edema.  She is maintained on this and done much better.  Weight is down a little bit.  Blood pressure is normal.  Overall she feels well.  She is in permanent A. fib with a controlled ventricular response.  11/03/2021  Kathleen Mcconnell is seen today in follow-up.  She is appropriately grieving as she lost her husband about  8 weeks ago.  She cared for him for a long time and he was quite ill.  According to her daughter she apparently has been losing some weight.  She is down about 3 pounds since the summer and says she is still eating.  She does not feel necessarily depressed but perhaps is not eating quite as much.  She denies any worsening swelling or shortness of breath.  Blood pressure is well controlled today.  EKG shows a persistent A. fib which is rate controlled.  She is due for an INR check which we may accommodate today.  05/29/2022  Kathleen Mcconnell returns today for follow-up.  She reports some ongoing shortness of breath but her daughter notes that she is actually had less swelling and seems to be doing a little bit better.  She is overdue for an echocardiogram to reassess her mitral and tricuspid insufficiencies.  Weight has been fairly stable.  Blood pressure is well controlled.  06/08/2023  Kathleen Mcconnell is seen today in follow-up.  Overall she  continues to do well.  She had a bout with bronchitis and severe coughing in May but has recovered from this.  It did seem to throw her INR is off somewhat.  She was a little bit high and now is low.  Blood pressure is well-controlled.  No significant worsening shortness of breath although she is on furosemide 20 mg daily now which seems to have helped her lower extremity edema.  Echo as reported previously has shown severe TR but this has been persistent for years.  Clinically, however she seems to be doing very well and remains active without limitations.  PMHx:  Past Medical History:  Diagnosis Date   History of nuclear stress test 03/2012   lexiscan; normal pattern of perfusion; normal, low risk    Hyperlipidemia    Hypertension    Hypothyroidism    Mild aortic insufficiency    Mitral valve regurgitation    Persistent atrial fibrillation (HCC)    History of; cardioversion in 2004   Severe tricuspid regurgitation    on echo 02/2011 ?rheumatic heart disease   Thyroid mass 10/10/2001   benign lesion removed    Past Surgical History:  Procedure Laterality Date   BREAST SURGERY     CARDIAC CATHETERIZATION  12/04/2001   normal systolic function, near normal coronaries    CARDIOVERSION N/A 01/20/2013   Procedure: CARDIOVERSION;  Surgeon: Chrystie Nose, MD;  Location: [facility] ENDOSCOPY;  Service: Cardiovascular;  Laterality: N/A;   THYROIDECTOMY  10/10/2001   TRANSTHORACIC ECHOCARDIOGRAM  02/02/2011   EF=>55%; mild MR; mod-severe TR & elevated RV systolic pressure, mild pulm HTN; aortic valve mildly sclerotic with mild regurg    FAMHx:  Family History  Problem Relation Age of Onset   Heart disease Father        also MI   Heart disease Brother        x 3, one deceased age 19   Heart disease Sister     SOCHx:   reports that she has never smoked. She has never used smokeless tobacco. She reports that she does not drink alcohol and does not use drugs.  ALLERGIES:  Allergies   Allergen Reactions   Aspirin Anaphylaxis, Hives, Rash and Other (See Comments)    As well as short of breath; Previously was rushed to hospital-given epinephrine injection.     ROS: Pertinent items noted in HPI and remainder of comprehensive ROS otherwise negative.  HOME MEDS: Current Outpatient Medications  Medication Sig Dispense Refill   acetaminophen (TYLENOL) 500 MG tablet Take 1,000 mg by mouth every 6 (six) hours as needed. For pain     B Complex Vitamins (B COMPLEX 1 PO) Take 1 tablet by mouth daily.     CALCIUM-VITAMIN D PO Take 1 tablet by mouth 2 (two) times daily.     diltiazem (CARDIZEM CD) 240 MG 24 hr capsule TAKE (1) CAPSULE BY MOUTH ONCE DAILY. 90 capsule 3   fish oil-omega-3 fatty acids 1000 MG capsule Take 1 g by mouth daily.     furosemide (LASIX) 20 MG tablet TAKE (1) TABLET BY MOUTH ONCE DAILY AS NEEDED 90 tablet 3   latanoprost (XALATAN) 0.005 % ophthalmic solution 1 drop at bedtime.     levothyroxine (SYNTHROID, LEVOTHROID) 88 MCG tablet Take 88 mcg by mouth daily.     lisinopril (ZESTRIL) 20 MG tablet TAKE (1) TABLET BY MOUTH DAILY. 90 tablet 2   metoprolol succinate (TOPROL-XL) 50 MG 24 hr tablet TAKE ONE TABLET BY MOUTH DAILY. 90 tablet 2   Multiple Vitamin (MULTIVITAMIN WITH MINERALS) TABS Take 1 tablet by mouth daily.     rosuvastatin (CRESTOR) 5 MG tablet TAKE ONE TABLET BY MOUTH DAILY. 90 tablet 2   warfarin (COUMADIN) 6 MG tablet TAKE 1 TABLET DAILY EXCEPT 1/2 TABLET ON MONDAYS, WEDNESDAYS, AND FRIDAYS. 100 tablet 0   No current facility-administered medications for this visit.    LABS/IMAGING: No results found for this or any previous visit (from the past 48 hour(s)). No results found.  VITALS: BP 124/76 (BP Location: Left Arm, Patient Position: Sitting, Cuff Size: Normal)   Pulse 73   Ht 5\' 2"  (1.575 m)   Wt 107 lb 9.6 oz (48.8 kg)   SpO2 96%   BMI 19.68 kg/m   EXAM: General appearance: alert and no distress Neck: no carotid bruit, no  JVD, and thyroid not enlarged, symmetric, no tenderness/mass/nodules Lungs: clear to auscultation bilaterally Heart: irregularly irregular rhythm and systolic murmur: early systolic 2/6, blowing at apex Abdomen: soft, non-tender; bowel sounds normal; no masses,  no organomegaly Extremities: extremities normal, atraumatic, no cyanosis or edema and venous stasis dermatitis noted Pulses: 2+ and symmetric Skin: Skin color, texture, turgor normal. No rashes or lesions Neurologic: Grossly normal Psych: Pleasant  EKG: EKG Interpretation Date/Time:  Friday June 08 2023 16:02:05 EDT Ventricular Rate:  73 PR Interval:    QRS Duration:  74 QT Interval:  390 QTC Calculation: 429 R Axis:   59  Text Interpretation: Atrial fibrillation Minimal voltage criteria for LVH, may be normal variant ( Sokolow-Lyon ) ST & T wave abnormality, consider inferior ischemia When compared with ECG of 15-Mar-2015 11:07, PREVIOUS ECG IS PRESENT Confirmed by Zoila Shutter [phone]) on 06/08/2023 4:08:48 PM   ASSESSMENT: DOE-  Echo with normal LV function at 65 to 70%, severe biatrial enlargement -  moderate MR, severe TR (11/2020) Lower extremity edema Permanent atrial fibrillation-asymptomatic Hypertension-controlled Long-term anticoagulation on warfarin History of TIA  PLAN: 1.   Mrs. Mcconnell continues to do well and remains asymptomatic on low-dose Lasix despite significant valvular insufficiency.  She is in permanent A-fib but rate controlled and on warfarin anticoagulation.  She is on daily low-dose furosemide 20 mg and this seems to currently be working well to maintain her freedom from edema.  No changes to her medicines today.  Plan follow-up with me annually or sooner as necessary.  Chrystie Nose, MD, [facility], FACP  [place]  [facility]  Medical  Director of the Advanced Lipid Disorders &  Cardiovascular Risk Reduction Clinic Diplomate of the American Board of Clinical Lipidology Attending  Cardiologist  Direct Dial: [phone]  Fax: [phone]  Website:  [email or web address] Daegon [email or web address] 06/08/2023, 4:08 PM

## 2023-06-08 NOTE — Patient Instructions (Signed)
Medication Instructions:  NO CHANGES  *If you need a refill on your cardiac medications before your next appointment, please call your pharmacy*   Follow-Up: At [facility], you and your health needs are our priority.  As part of our continuing mission to provide you with exceptional heart care, we have created designated Provider Care Teams.  These Care Teams include your primary Cardiologist (physician) and Advanced Practice Providers (APPs -  Physician Assistants and Nurse Practitioners) who all work together to provide you with the care you need, when you need it.  We recommend signing up for the patient portal called "MyChart".  Sign up information is provided on this After Visit Summary.  MyChart is used to connect with patients for Virtual Visits (Telemedicine).  Patients are able to view lab/test results, encounter notes, upcoming appointments, etc.  Non-urgent messages can be sent to your provider as well.   To learn more about what you can do with MyChart, go to [email or web address].    Your next appointment:    12 months with Dr. Hilty  

## 2023-06-12 ENCOUNTER — Ambulatory Visit: Payer: Medicare Other | Attending: Internal Medicine | Admitting: *Deleted

## 2023-06-12 DIAGNOSIS — Z5181 Encounter for therapeutic drug level monitoring: Secondary | ICD-10-CM | POA: Diagnosis not present

## 2023-06-12 DIAGNOSIS — I4891 Unspecified atrial fibrillation: Secondary | ICD-10-CM | POA: Diagnosis not present

## 2023-06-12 LAB — POCT INR: INR: 2.3 (ref 2.0–3.0)

## 2023-06-12 NOTE — Patient Instructions (Signed)
Continue warfarin 1/2 tablet daily except 1 tablet on Mondays, Wednesdays and Fridays Continue greens/salad Recheck in 4 wks

## 2023-06-18 ENCOUNTER — Other Ambulatory Visit: Payer: Self-pay | Admitting: Internal Medicine

## 2023-06-18 DIAGNOSIS — I4891 Unspecified atrial fibrillation: Secondary | ICD-10-CM

## 2023-06-18 NOTE — Telephone Encounter (Signed)
Refill request for warfarin:  Last INR was 2.3 on 06/12/23 Next INR due 07/10/23 LOV was 06/08/23  Refill approved.

## 2023-07-10 ENCOUNTER — Ambulatory Visit: Payer: Medicare Other | Attending: Internal Medicine | Admitting: *Deleted

## 2023-07-10 DIAGNOSIS — Z5181 Encounter for therapeutic drug level monitoring: Secondary | ICD-10-CM | POA: Diagnosis not present

## 2023-07-10 DIAGNOSIS — I4891 Unspecified atrial fibrillation: Secondary | ICD-10-CM | POA: Insufficient documentation

## 2023-07-10 LAB — POCT INR: INR: 2.6 (ref 2.0–3.0)

## 2023-07-10 NOTE — Patient Instructions (Signed)
Continue warfarin 1/2 tablet daily except 1 tablet on Mondays, Wednesdays and Fridays Continue greens/salad Recheck in 6 wks

## 2023-08-09 DIAGNOSIS — D692 Other nonthrombocytopenic purpura: Secondary | ICD-10-CM | POA: Diagnosis not present

## 2023-08-09 DIAGNOSIS — D2261 Melanocytic nevi of right upper limb, including shoulder: Secondary | ICD-10-CM | POA: Diagnosis not present

## 2023-08-09 DIAGNOSIS — L821 Other seborrheic keratosis: Secondary | ICD-10-CM | POA: Diagnosis not present

## 2023-08-09 DIAGNOSIS — Z85820 Personal history of malignant melanoma of skin: Secondary | ICD-10-CM | POA: Diagnosis not present

## 2023-08-09 DIAGNOSIS — D225 Melanocytic nevi of trunk: Secondary | ICD-10-CM | POA: Diagnosis not present

## 2023-08-09 DIAGNOSIS — D1801 Hemangioma of skin and subcutaneous tissue: Secondary | ICD-10-CM | POA: Diagnosis not present

## 2023-08-09 DIAGNOSIS — D2272 Melanocytic nevi of left lower limb, including hip: Secondary | ICD-10-CM | POA: Diagnosis not present

## 2023-08-09 DIAGNOSIS — L817 Pigmented purpuric dermatosis: Secondary | ICD-10-CM | POA: Diagnosis not present

## 2023-08-09 DIAGNOSIS — Z85828 Personal history of other malignant neoplasm of skin: Secondary | ICD-10-CM | POA: Diagnosis not present

## 2023-08-21 ENCOUNTER — Ambulatory Visit: Payer: Medicare Other | Attending: Internal Medicine

## 2023-08-21 DIAGNOSIS — Z5181 Encounter for therapeutic drug level monitoring: Secondary | ICD-10-CM | POA: Diagnosis not present

## 2023-08-21 DIAGNOSIS — I4891 Unspecified atrial fibrillation: Secondary | ICD-10-CM | POA: Diagnosis not present

## 2023-08-21 DIAGNOSIS — G459 Transient cerebral ischemic attack, unspecified: Secondary | ICD-10-CM | POA: Insufficient documentation

## 2023-08-21 LAB — POCT INR: INR: 2.5 (ref 2.0–3.0)

## 2023-08-21 NOTE — Patient Instructions (Signed)
Description   Continue warfarin 1/2 tablet daily except 1 tablet on Mondays, Wednesdays and Fridays Continue greens/salad Recheck in 6 wks

## 2023-08-24 DIAGNOSIS — Z961 Presence of intraocular lens: Secondary | ICD-10-CM | POA: Diagnosis not present

## 2023-08-24 DIAGNOSIS — H401131 Primary open-angle glaucoma, bilateral, mild stage: Secondary | ICD-10-CM | POA: Diagnosis not present

## 2023-08-24 DIAGNOSIS — H52203 Unspecified astigmatism, bilateral: Secondary | ICD-10-CM | POA: Diagnosis not present

## 2023-08-24 DIAGNOSIS — H532 Diplopia: Secondary | ICD-10-CM | POA: Diagnosis not present

## 2023-08-27 ENCOUNTER — Other Ambulatory Visit: Payer: Self-pay | Admitting: Internal Medicine

## 2023-09-19 ENCOUNTER — Ambulatory Visit (INDEPENDENT_AMBULATORY_CARE_PROVIDER_SITE_OTHER): Payer: Medicare Other

## 2023-09-19 DIAGNOSIS — Z23 Encounter for immunization: Secondary | ICD-10-CM | POA: Diagnosis not present

## 2023-09-19 NOTE — Progress Notes (Cosign Needed Addendum)
Pt came into office for high dose flu vaccine. Injection given on the L-upper arm; pt tol inj well w/no c/o. Pt left ambulatory w/no c/o

## 2023-09-25 ENCOUNTER — Telehealth: Payer: Self-pay | Admitting: Internal Medicine

## 2023-09-25 NOTE — Telephone Encounter (Signed)
Pt c/o medication issue:  1. Name of Medication:  Warfarin 6 MG  2. How are you currently taking this medication (dosage and times per day)?   3. Are you having a reaction (difficulty breathing--STAT)?   4. What is your medication issue?   Melanie with [employer] states they are changing the manufacturer for Warfarin 6 MG and they must notify the prescriber that it may cause slight changes. She is requesting a call back to confirm.

## 2023-09-25 NOTE — Telephone Encounter (Signed)
Forwarding to Anticoag clinic

## 2023-09-25 NOTE — Telephone Encounter (Signed)
Spoke with [employer].  Ok'd manufacture change for warfarin.  Pt has an INR appt on 10/02/23.

## 2023-10-02 ENCOUNTER — Ambulatory Visit: Payer: Medicare Other | Attending: Internal Medicine | Admitting: *Deleted

## 2023-10-02 DIAGNOSIS — I4891 Unspecified atrial fibrillation: Secondary | ICD-10-CM | POA: Diagnosis not present

## 2023-10-02 DIAGNOSIS — Z5181 Encounter for therapeutic drug level monitoring: Secondary | ICD-10-CM | POA: Diagnosis not present

## 2023-10-02 LAB — POCT INR: INR: 3.1 — AB (ref 2.0–3.0)

## 2023-10-02 NOTE — Patient Instructions (Signed)
Continue warfarin 1/2 tablet daily except 1 tablet on Mondays, Wednesdays and Fridays Continue greens/salad Recheck in 6 wks

## 2023-10-29 ENCOUNTER — Other Ambulatory Visit: Payer: Self-pay | Admitting: Family Medicine

## 2023-10-29 ENCOUNTER — Telehealth: Payer: Self-pay

## 2023-10-29 NOTE — Telephone Encounter (Signed)
Copied from CRM [id number]. Topic: Appointments - Appointment Scheduling >> Oct 26, 2023  3:56 PM Hector Shade B wrote: Patient/patient representative is calling to schedule an appointment. Refer to attachments for appointment information.

## 2023-10-30 ENCOUNTER — Telehealth: Payer: Self-pay | Admitting: Family Medicine

## 2023-10-30 NOTE — Telephone Encounter (Signed)
Requested medication (s) are due for refill today: Yes  Requested medication (s) are on the active medication list: Yes  Last refill:  12/19/2012  Future visit scheduled: No  Notes to clinic:  Unable to refill per protocol, last refill by another provider.      Requested Prescriptions  Pending Prescriptions Disp Refills   levothyroxine (SYNTHROID) 88 MCG tablet [Pharmacy Med Name: levothyroxine 88 mcg tablet] 90 tablet 3    Sig: TAKE 1 TABLET BY MOUTH ONCE DAILY FOR THYROID.     Endocrinology:  Hypothyroid Agents Failed - 10/29/2023  1:47 PM      Failed - Valid encounter within last 12 months    Recent Outpatient Visits   None            Passed - TSH in normal range and within 360 days    TSH  Date Value Ref Range Status  04/13/2023 1.66 0.40 - 4.50 mIU/L Final

## 2023-10-30 NOTE — Telephone Encounter (Signed)
Copied from CRM [id number]. Topic: General - Call Back - No Documentation >> Oct 29, 2023 10:19 AM Deaijah H wrote: Reason for CRM: Patient called in returning "[employer]" call  Originally called patient to schedule Medicare AWV. Returned call missed from patient; phone kept ringing. No answer/no voicemail.

## 2023-11-06 ENCOUNTER — Other Ambulatory Visit: Payer: Self-pay

## 2023-11-06 ENCOUNTER — Other Ambulatory Visit: Payer: Self-pay | Admitting: Family Medicine

## 2023-11-06 ENCOUNTER — Telehealth: Payer: Self-pay | Admitting: Family Medicine

## 2023-11-06 MED ORDER — LEVOTHYROXINE SODIUM 88 MCG PO TABS
88.0000 ug | ORAL_TABLET | Freq: Every day | ORAL | 3 refills | Status: DC
Start: 2023-11-06 — End: 2024-10-21

## 2023-11-06 NOTE — Telephone Encounter (Signed)
Copied from CRM [id number]. Topic: Clinical - Medication Refill >> Nov 06, 2023 10:14 AM Maxwell Marion wrote: Most Recent Primary Care Visit:  Provider: [facility]  Department: [facility]  Visit Type: FLU SHOT 5  Date: 09/19/2023  Medication: levothyroxine (SYNTHROID) 88 MCG tablet  Has the patient contacted their pharmacy? Yes, pharmacy said they could not fill prescription and to  (Agent: If no, request that the patient contact the pharmacy for the refill. If patient does not wish to contact the pharmacy document the reason why and proceed with request.) (Agent: If yes, when and what did the pharmacy advise?)  Is this the correct pharmacy for this prescription? Yes If no, delete pharmacy and type the correct one.  This is the patient's preferred pharmacy:  [facility] - [place], [place] - [address] [place] [address] Phone: [phone] Fax: [phone]   Has the prescription been filled recently?   Is the patient out of the medication?   Has the patient been seen for an appointment in the last year OR does the patient have an upcoming appointment?   Can we respond through MyChart?   Agent: Please be advised that Rx refills may take up to 3 business days. We ask that you follow-up with your pharmacy.

## 2023-11-06 NOTE — Telephone Encounter (Signed)
Request received from pt pharmacy for medication-Levothyroxine .  Medication was Rx by different provider and med? Are you ok to refill it?

## 2023-11-06 NOTE — Telephone Encounter (Signed)
Copied from CRM [id number]. Topic: Clinical - Medication Refill >> Nov 06, 2023 11:25 AM Raven B wrote: Most Recent Primary Care Visit:  Provider: [facility]  Department: [facility]  Visit Type: FLU SHOT 5  Date: 09/19/2023  Medication: ***  Has the patient contacted their pharmacy?  (Agent: If no, request that the patient contact the pharmacy for the refill. If patient does not wish to contact the pharmacy document the reason why and proceed with request.) (Agent: If yes, when and what did the pharmacy advise?)  Is this the correct pharmacy for this prescription?  If no, delete pharmacy and type the correct one.  This is the patient's preferred pharmacy:  [facility] - [place], [place] - [address] [place] [address] Phone: [phone] Fax: [phone]   Has the prescription been filled recently?   Is the patient out of the medication?   Has the patient been seen for an appointment in the last year OR does the patient have an upcoming appointment?   Can we respond through MyChart?   Agent: Please be advised that Rx refills may take up to 3 business days. We ask that you follow-up with your pharmacy.

## 2023-11-13 ENCOUNTER — Ambulatory Visit: Payer: Medicare Other | Attending: Internal Medicine | Admitting: *Deleted

## 2023-11-13 DIAGNOSIS — Z5181 Encounter for therapeutic drug level monitoring: Secondary | ICD-10-CM | POA: Diagnosis not present

## 2023-11-13 DIAGNOSIS — I4891 Unspecified atrial fibrillation: Secondary | ICD-10-CM | POA: Diagnosis not present

## 2023-11-13 LAB — POCT INR: INR: 2.5 (ref 2.0–3.0)

## 2023-11-13 NOTE — Patient Instructions (Signed)
Continue warfarin 1/2 tablet daily except 1 tablet on Mondays, Wednesdays and Fridays Continue greens/salad Recheck in 6 wks

## 2023-11-14 ENCOUNTER — Ambulatory Visit: Payer: Medicare Other | Admitting: Family Medicine

## 2023-11-15 ENCOUNTER — Ambulatory Visit: Payer: Medicare Other | Admitting: Family Medicine

## 2023-11-17 ENCOUNTER — Other Ambulatory Visit: Payer: Self-pay | Admitting: Internal Medicine

## 2023-12-11 ENCOUNTER — Ambulatory Visit (INDEPENDENT_AMBULATORY_CARE_PROVIDER_SITE_OTHER): Payer: Medicare Other | Admitting: Family Medicine

## 2023-12-11 VITALS — BP 134/76 | HR 76 | Temp 97.7°F | Ht 62.0 in | Wt 105.4 lb

## 2023-12-11 DIAGNOSIS — I482 Chronic atrial fibrillation, unspecified: Secondary | ICD-10-CM

## 2023-12-11 DIAGNOSIS — I071 Rheumatic tricuspid insufficiency: Secondary | ICD-10-CM | POA: Diagnosis not present

## 2023-12-11 DIAGNOSIS — E038 Other specified hypothyroidism: Secondary | ICD-10-CM | POA: Diagnosis not present

## 2023-12-11 DIAGNOSIS — Z0000 Encounter for general adult medical examination without abnormal findings: Secondary | ICD-10-CM | POA: Diagnosis not present

## 2023-12-11 DIAGNOSIS — I340 Nonrheumatic mitral (valve) insufficiency: Secondary | ICD-10-CM | POA: Diagnosis not present

## 2023-12-11 DIAGNOSIS — Z23 Encounter for immunization: Secondary | ICD-10-CM

## 2023-12-11 DIAGNOSIS — Z1231 Encounter for screening mammogram for malignant neoplasm of breast: Secondary | ICD-10-CM

## 2023-12-11 DIAGNOSIS — Z0001 Encounter for general adult medical examination with abnormal findings: Secondary | ICD-10-CM

## 2023-12-11 DIAGNOSIS — I10 Essential (primary) hypertension: Secondary | ICD-10-CM | POA: Diagnosis not present

## 2023-12-11 DIAGNOSIS — M810 Age-related osteoporosis without current pathological fracture: Secondary | ICD-10-CM

## 2023-12-11 MED ORDER — GABAPENTIN 100 MG PO CAPS: 100.0000 mg | ORAL_CAPSULE | Freq: Every day | ORAL | 3 refills | Status: DC

## 2023-12-11 NOTE — Progress Notes (Signed)
 Subjective:    Patient ID: Kathleen Mcconnell, female    DOB: [birth year], 83 y.o.   MRN: [id number]  HPI  Has a history of moderate to severe mitral and tricuspid regurgitation.  She is being followed annually by her cardiologist Dr. Mona.  She is on Coumadin  for atrial fibrillation.  She tried Xarelto  in the past and had a bad reaction to it.  She prefers to stay on Coumadin .  Patient is here today for her complete physical exam.  She is due for mammogram.  Due to her age, she does not require colonoscopy or a Pap smear.  She has a history of osteoporosis.  Her last bone density test was 2021.  At that time her T-score was -2.8.  She states that she the Fosamax for 5 years in the past and then they stopped the medication to give her a therapeutic holiday.  She is due to repeat bone density test to evaluate for any progression.  She is due for the new pneumonia vaccine.  She has had shingles in the past.  She recently had RSV vaccine.  She continues to complain of pain in her feet.  She reports numbness and tingling and burning in her feet especially at night which keeps her from sleeping. Past Medical History:  Diagnosis Date   History of nuclear stress test 03/2012   lexiscan ; normal pattern of perfusion; normal, low risk    Hyperlipidemia    Hypertension    Hypothyroidism    Mild aortic insufficiency    Mitral valve regurgitation    Persistent atrial fibrillation (HCC)    History of; cardioversion in 2004   Severe tricuspid regurgitation    on echo 02/2011 ?rheumatic heart disease   Thyroid  mass 10/10/2001   benign lesion removed   Past Surgical History:  Procedure Laterality Date   BREAST SURGERY     CARDIAC CATHETERIZATION  12/04/2001   normal systolic function, near normal coronaries    CARDIOVERSION N/A 01/20/2013   Procedure: CARDIOVERSION;  Surgeon: Vinie KYM Mona, MD;  Location: [facility] ENDOSCOPY;  Service: Cardiovascular;  Laterality: N/A;   THYROIDECTOMY  10/10/2001    TRANSTHORACIC ECHOCARDIOGRAM  02/02/2011   EF=>55%; mild MR; mod-severe TR & elevated RV systolic pressure, mild pulm HTN; aortic valve mildly sclerotic with mild regurg   Current Outpatient Medications on File Prior to Visit  Medication Sig Dispense Refill   acetaminophen  (TYLENOL ) 500 MG tablet Take 1,000 mg by mouth every 6 (six) hours as needed. For pain     B Complex Vitamins (B COMPLEX 1 PO) Take 1 tablet by mouth daily.     CALCIUM -VITAMIN D PO Take 1 tablet by mouth 2 (two) times daily.     diltiazem  (CARDIZEM  CD) 240 MG 24 hr capsule TAKE (1) CAPSULE BY MOUTH ONCE DAILY. 90 capsule 1   fish oil-omega-3 fatty acids 1000 MG capsule Take 1 g by mouth daily.     furosemide  (LASIX ) 20 MG tablet TAKE (1) TABLET BY MOUTH ONCE DAILY AS NEEDED 90 tablet 1   latanoprost (XALATAN) 0.005 % ophthalmic solution 1 drop at bedtime.     levothyroxine  (SYNTHROID ) 88 MCG tablet Take 1 tablet (88 mcg total) by mouth daily. 90 tablet 3   lisinopril  (ZESTRIL ) 20 MG tablet TAKE (1) TABLET BY MOUTH DAILY. 90 tablet 3   metoprolol  succinate (TOPROL -XL) 50 MG 24 hr tablet TAKE ONE TABLET BY MOUTH DAILY. 90 tablet 3   Multiple Vitamin (MULTIVITAMIN WITH MINERALS) TABS Take 1  tablet by mouth daily.     rosuvastatin  (CRESTOR ) 5 MG tablet TAKE ONE TABLET BY MOUTH DAILY. 90 tablet 3   warfarin (COUMADIN ) 6 MG tablet TAKE 1/2 TABLET DAILY EXCEPT 1 TABLET ON MONDAYS, WEDNESDAYS, AND FRIDAYS. 72 tablet 3   No current facility-administered medications on file prior to visit.   Allergies  Allergen Reactions   Aspirin Anaphylaxis, Hives, Rash and Other (See Comments)    As well as short of breath; Previously was rushed to hospital-given epinephrine injection.    Social History   Socioeconomic History   Marital status: Married    Spouse name: Not on file   Number of children: 2   Years of education: Not on file   Highest education level: 12th grade  Occupational History   Occupation: retired  Tobacco Use    Smoking status: Never   Smokeless tobacco: Never  Substance and Sexual Activity   Alcohol use: No   Drug use: No   Sexual activity: Not on file  Other Topics Concern   Not on file  Social History Narrative   Pt lives in [place] with spouse.  Retired catering manager.   Social Drivers of Corporate Investment Banker Strain: Low Risk  (11/10/2023)   Overall Financial Resource Strain (CARDIA)    Difficulty of Paying Living Expenses: Not very hard  Food Insecurity: No Food Insecurity (11/10/2023)   Hunger Vital Sign    Worried About Running Out of Food in the Last Year: Never true    Ran Out of Food in the Last Year: Never true  Transportation Needs: No Transportation Needs (11/10/2023)   PRAPARE - Administrator, Civil Service (Medical): No    Lack of Transportation (Non-Medical): No  Physical Activity: Unknown (11/10/2023)   Exercise Vital Sign    Days of Exercise per Week: Patient declined    Minutes of Exercise per Session: Not on file  Stress: No Stress Concern Present (11/10/2023)   [employer] of Occupational Health - Occupational Stress Questionnaire    Feeling of Stress : Only a little  Social Connections: Socially Isolated (11/10/2023)   Social Connection and Isolation Panel [NHANES]    Frequency of Communication with Friends and Family: More than three times a week    Frequency of Social Gatherings with Friends and Family: Once a week    Attends Religious Services: Never    Database Administrator or Organizations: No    Attends Engineer, Structural: Not on file    Marital Status: Widowed  Intimate Partner Violence: Unknown (03/07/2022)   Received from [facility]   HITS    Physically Hurt: Not on file    Insult or Talk Down To: Not on file    Threaten Physical Harm: Not on file    Scream or Curse: Not on file    Family History  Problem Relation Age of Onset   Heart disease Father        also MI   Heart disease Brother        x  3, one deceased age 48   Heart disease Sister     Review of Systems  All other systems reviewed and are negative.      Objective:   Physical Exam Vitals reviewed.  Constitutional:      General: She is not in acute distress.    Appearance: Normal appearance. She is normal weight. She is not ill-appearing, toxic-appearing or diaphoretic.  HENT:  Head: Normocephalic and atraumatic.     Nose: Nose normal. No congestion or rhinorrhea.     Mouth/Throat:     Mouth: Mucous membranes are moist.     Pharynx: Oropharynx is clear. No oropharyngeal exudate or posterior oropharyngeal erythema.  Eyes:     General: No scleral icterus.    Extraocular Movements: Extraocular movements intact.     Conjunctiva/sclera: Conjunctivae normal.     Pupils: Pupils are equal, round, and reactive to light.  Neck:     Vascular: No carotid bruit.  Cardiovascular:     Rate and Rhythm: Normal rate. Rhythm irregular.     Pulses: Normal pulses.     Heart sounds: Murmur heard.     No gallop.  Pulmonary:     Effort: Pulmonary effort is normal. No respiratory distress.     Breath sounds: Normal breath sounds. No wheezing, rhonchi or rales.  Abdominal:     General: Abdomen is flat. Bowel sounds are normal. There is no distension.     Palpations: Abdomen is soft.     Tenderness: There is no abdominal tenderness. There is no guarding or rebound.  Musculoskeletal:     Cervical back: Normal range of motion. No rigidity.     Right lower leg: No edema.     Left lower leg: No edema.  Lymphadenopathy:     Cervical: No cervical adenopathy.  Skin:    General: Skin is warm.     Coloration: Skin is not jaundiced.     Findings: No bruising, erythema, lesion or rash.  Neurological:     General: No focal deficit present.     Mental Status: She is alert and oriented to person, place, and time. Mental status is at baseline.     Deep Tendon Reflexes: Reflexes normal.  Psychiatric:        Mood and Affect: Mood  normal.        Behavior: Behavior normal.        Thought Content: Thought content normal.        Judgment: Judgment normal.     Patient has diminished pulses in both feet right greater than left with Schamberg's disease in both legs      Assessment & Plan:  Chronic atrial fibrillation (HCC) - Plan: CBC with Differential/Platelet, COMPLETE METABOLIC PANEL WITH GFR, Lipid panel, TSH  Essential hypertension - Plan: CBC with Differential/Platelet, COMPLETE METABOLIC PANEL WITH GFR, Lipid panel, TSH  Other specified hypothyroidism - Plan: CBC with Differential/Platelet, COMPLETE METABOLIC PANEL WITH GFR, Lipid panel, TSH  Tricuspid valve insufficiency, unspecified etiology  Mitral valve insufficiency, unspecified etiology  General medical exam - Plan: CBC with Differential/Platelet, COMPLETE METABOLIC PANEL WITH GFR, Lipid panel, TSH  Osteoporosis, unspecified osteoporosis type, unspecified pathological fracture presence - Plan: DG Bone Density  Encounter for screening mammogram for malignant neoplasm of breast - Plan: MM 3D SCREENING MAMMOGRAM BILATERAL BREAST  Need for pneumococcal vaccine - Plan: Pneumococcal Conjugate PCV21(Capvaxive) Physical exam today is normal.  Patient received Capvaxive.  She declines the shingles shot.  Her flu shot and her RSV shot are up-to-date.  I will schedule the patient a bone density test.  Her T-score is worsening I would resume Fosamax.  She continue I will send vitamin D.  I will schedule the patient for mammogram.  Check CBC CMP lipid panel and TSH.  Start gabapentin  100 mg p.o. nightly for peripheral neuropathy.  Consider evaluation for peripheral vascular disease if worsening

## 2023-12-12 LAB — CBC WITH DIFFERENTIAL/PLATELET
Absolute Lymphocytes: 2694 {cells}/uL (ref 850–3900)
Absolute Monocytes: 506 {cells}/uL (ref 200–950)
Basophils Absolute: 38 {cells}/uL (ref 0–200)
Basophils Relative: 0.6 %
Eosinophils Absolute: 51 {cells}/uL (ref 15–500)
Eosinophils Relative: 0.8 %
HCT: 47 % — ABNORMAL HIGH (ref 35.0–45.0)
Hemoglobin: 15.8 g/dL — ABNORMAL HIGH (ref 11.7–15.5)
MCH: 33 pg (ref 27.0–33.0)
MCHC: 33.6 g/dL (ref 32.0–36.0)
MCV: 98.1 fL (ref 80.0–100.0)
MPV: 10.1 fL (ref 7.5–12.5)
Monocytes Relative: 7.9 %
Neutro Abs: 3110 {cells}/uL (ref 1500–7800)
Neutrophils Relative %: 48.6 %
Platelets: 208 10*3/uL (ref 140–400)
RBC: 4.79 10*6/uL (ref 3.80–5.10)
RDW: 11.7 % (ref 11.0–15.0)
Total Lymphocyte: 42.1 %
WBC: 6.4 10*3/uL (ref 3.8–10.8)

## 2023-12-12 LAB — COMPLETE METABOLIC PANEL WITH GFR
AG Ratio: 1.8 (calc) (ref 1.0–2.5)
ALT: 19 U/L (ref 6–29)
AST: 37 U/L — ABNORMAL HIGH (ref 10–35)
Albumin: 4.8 g/dL (ref 3.6–5.1)
Alkaline phosphatase (APISO): 65 U/L (ref 37–153)
BUN: 23 mg/dL (ref 7–25)
CO2: 29 mmol/L (ref 20–32)
Calcium: 9.8 mg/dL (ref 8.6–10.4)
Chloride: 100 mmol/L (ref 98–110)
Creat: 0.73 mg/dL (ref 0.60–0.95)
Globulin: 2.6 g/dL (ref 1.9–3.7)
Glucose, Bld: 102 mg/dL — ABNORMAL HIGH (ref 65–99)
Potassium: 3.4 mmol/L — ABNORMAL LOW (ref 3.5–5.3)
Sodium: 143 mmol/L (ref 135–146)
Total Bilirubin: 0.9 mg/dL (ref 0.2–1.2)
Total Protein: 7.4 g/dL (ref 6.1–8.1)
eGFR: 81 mL/min/{1.73_m2} (ref >=60)

## 2023-12-12 LAB — LIPID PANEL
Cholesterol: 165 mg/dL (ref <200)
HDL: 87 mg/dL (ref >=50)
LDL Cholesterol (Calc): 55 mg/dL
Non-HDL Cholesterol (Calc): 78 mg/dL (ref <130)
Total CHOL/HDL Ratio: 1.9 (calc) (ref <5.0)
Triglycerides: 156 mg/dL — ABNORMAL HIGH (ref <150)

## 2023-12-12 LAB — TSH: TSH: 3.45 m[IU]/L (ref 0.40–4.50)

## 2023-12-20 ENCOUNTER — Ambulatory Visit: Payer: Medicare Other | Admitting: Family Medicine

## 2023-12-20 ENCOUNTER — Encounter: Payer: Self-pay | Admitting: Family Medicine

## 2023-12-20 VITALS — BP 120/80 | HR 69 | Temp 97.6°F | Ht 62.0 in | Wt 100.8 lb

## 2023-12-20 DIAGNOSIS — J40 Bronchitis, not specified as acute or chronic: Secondary | ICD-10-CM | POA: Diagnosis not present

## 2023-12-20 MED ORDER — BENZONATATE 100 MG PO CAPS
100.0000 mg | ORAL_CAPSULE | Freq: Three times a day (TID) | ORAL | 0 refills | Status: AC | PRN
Start: 2023-12-20 — End: ?

## 2023-12-20 MED ORDER — ALBUTEROL SULFATE HFA 108 (90 BASE) MCG/ACT IN AERS: 2.0000 | INHALATION_SPRAY | Freq: Four times a day (QID) | RESPIRATORY_TRACT | 0 refills | Status: DC | PRN

## 2023-12-20 NOTE — Assessment & Plan Note (Signed)
7 days of acute cough without associated symptoms. Start Tessalon 100mg  TID PRN for cough and Albuterol q6h PRN for SOB or wheezing. Return to office if symptoms persist or worsen.

## 2023-12-20 NOTE — Progress Notes (Signed)
Subjective:  HPI: Kathleen Mcconnell is a 83 y.o. female presenting on 12/20/2023 for Acute Visit ( cough, not sleeping/)   HPI Patient is in today for 7 days of dry cough that is worsening and keeping her up at night. She is experiencing no other symptoms. This happened back in May and she received a steroid shot which helped. No PMH of asthma or COPD, denies GERD symptoms. No SOB, wheezing, sick exposures. Has tried Brompheniramine/Pseudophedrine/DM. No new medications however she is taking Lisinopril.  Denies ear pain, sore throat, nasal congestion or rhinorrhea, sinus congestion/pressure/pain, chest congestion, fever, chills, body aches, fatigue.  Review of Systems  All other systems reviewed and are negative.   Relevant past medical history reviewed and updated as indicated.   Past Medical History:  Diagnosis Date   History of nuclear stress test 03/2012   lexiscan; normal pattern of perfusion; normal, low risk    Hyperlipidemia    Hypertension    Hypothyroidism    Mild aortic insufficiency    Mitral valve regurgitation    Persistent atrial fibrillation (HCC)    History of; cardioversion in 2004   Severe tricuspid regurgitation    on echo 02/2011 ?rheumatic heart disease   Thyroid mass 10/10/2001   benign lesion removed     Past Surgical History:  Procedure Laterality Date   BREAST SURGERY     CARDIAC CATHETERIZATION  12/04/2001   normal systolic function, near normal coronaries    CARDIOVERSION N/A 01/20/2013   Procedure: CARDIOVERSION;  Surgeon: Chrystie Nose, MD;  Location[facility] ENDOSCOPY;  Service: Cardiovascular;  Laterality: N/A;   THYROIDECTOMY  10/10/2001   TRANSTHORACIC ECHOCARDIOGRAM  02/02/2011   EF=>55%; mild MR; mod-severe TR & elevated RV systolic pressure, mild pulm HTN; aortic valve mildly sclerotic with mild regurg    Allergies and medications reviewed and updated.   Current Outpatient Medications:    acetaminophen (TYLENOL) 500 MG tablet, Take 1,000  mg by mouth every 6 (six) hours as needed. For pain, Disp: , Rfl:    albuterol (VENTOLIN HFA) 108 (90 Base) MCG/ACT inhaler, Inhale 2 puffs into the lungs every 6 (six) hours as needed for wheezing or shortness of breath., Disp: 8 g, Rfl: 0   B Complex Vitamins (B COMPLEX 1 PO), Take 1 tablet by mouth daily., Disp: , Rfl:    benzonatate (TESSALON) 100 MG capsule, Take 1 capsule (100 mg total) by mouth 3 (three) times daily as needed for cough., Disp: 20 capsule, Rfl: 0   CALCIUM-VITAMIN D PO, Take 1 tablet by mouth 2 (two) times daily., Disp: , Rfl:    diltiazem (CARDIZEM CD) 240 MG 24 hr capsule, TAKE (1) CAPSULE BY MOUTH ONCE DAILY., Disp: 90 capsule, Rfl: 1   fish oil-omega-3 fatty acids 1000 MG capsule, Take 1 g by mouth daily., Disp: , Rfl:    furosemide (LASIX) 20 MG tablet, TAKE (1) TABLET BY MOUTH ONCE DAILY AS NEEDED, Disp: 90 tablet, Rfl: 1   gabapentin (NEURONTIN) 100 MG capsule, Take 1 capsule (100 mg total) by mouth at bedtime., Disp: 30 capsule, Rfl: 3   latanoprost (XALATAN) 0.005 % ophthalmic solution, 1 drop at bedtime., Disp: , Rfl:    levothyroxine (SYNTHROID) 88 MCG tablet, Take 1 tablet (88 mcg total) by mouth daily., Disp: 90 tablet, Rfl: 3   lisinopril (ZESTRIL) 20 MG tablet, TAKE (1) TABLET BY MOUTH DAILY., Disp: 90 tablet, Rfl: 3   metoprolol succinate (TOPROL-XL) 50 MG 24 hr tablet, TAKE ONE TABLET BY  MOUTH DAILY., Disp: 90 tablet, Rfl: 3   Multiple Vitamin (MULTIVITAMIN WITH MINERALS) TABS, Take 1 tablet by mouth daily., Disp: , Rfl:    rosuvastatin (CRESTOR) 5 MG tablet, TAKE ONE TABLET BY MOUTH DAILY., Disp: 90 tablet, Rfl: 3   warfarin (COUMADIN) 6 MG tablet, TAKE 1/2 TABLET DAILY EXCEPT 1 TABLET ON MONDAYS, WEDNESDAYS, AND FRIDAYS., Disp: 72 tablet, Rfl: 3  Allergies  Allergen Reactions   Aspirin Anaphylaxis, Hives, Rash and Other (See Comments)    As well as short of breath; Previously was rushed to hospital-given epinephrine injection.     Objective:   BP  120/80   Pulse 69   Temp 97.6 F (36.4 C) (Oral)   Ht 5\' 2"  (1.575 m)   Wt 100 lb 12.8 oz (45.7 kg)   SpO2 94%   BMI 18.44 kg/m      12/20/2023   10:37 AM 12/11/2023    2:06 PM 06/08/2023    3:58 PM  Vitals with BMI  Height 5\' 2"  5\' 2"  5\' 2"   Weight 100 lbs 13 oz 105 lbs 6 oz 107 lbs 10 oz  BMI 18.43 19.27 19.68  Systolic 120 134 161  Diastolic 80 76 76  Pulse 69 76 73     Physical Exam Vitals and nursing note reviewed.  Constitutional:      Appearance: Normal appearance. She is normal weight.  HENT:     Head: Normocephalic and atraumatic.     Right Ear: Tympanic membrane, ear canal and external ear normal.     Left Ear: Tympanic membrane, ear canal and external ear normal.     Nose: Nose normal.     Mouth/Throat:     Mouth: Mucous membranes are moist.     Pharynx: Oropharynx is clear.  Eyes:     Extraocular Movements: Extraocular movements intact.     Conjunctiva/sclera: Conjunctivae normal.     Pupils: Pupils are equal, round, and reactive to light.  Cardiovascular:     Rate and Rhythm: Normal rate and regular rhythm.     Pulses: Normal pulses.     Heart sounds: Normal heart sounds.  Pulmonary:     Effort: Pulmonary effort is normal.     Breath sounds: Normal breath sounds.  Musculoskeletal:     Cervical back: No tenderness.  Lymphadenopathy:     Cervical: No cervical adenopathy.  Skin:    General: Skin is warm and dry.  Neurological:     General: No focal deficit present.     Mental Status: She is alert and oriented to person, place, and time. Mental status is at baseline.  Psychiatric:        Mood and Affect: Mood normal.        Behavior: Behavior normal.        Thought Content: Thought content normal.        Judgment: Judgment normal.     Assessment & Plan:  Bronchitis Assessment & Plan: 7 days of acute cough without associated symptoms. Start Tessalon 100mg  TID PRN for cough and Albuterol q6h PRN for SOB or wheezing. Return to office if symptoms  persist or worsen.    Other orders -     Albuterol Sulfate HFA; Inhale 2 puffs into the lungs every 6 (six) hours as needed for wheezing or shortness of breath.  Dispense: 8 g; Refill: 0 -     Benzonatate; Take 1 capsule (100 mg total) by mouth 3 (three) times daily as needed for cough.  Dispense:  20 capsule; Refill: 0     Follow up plan: Return if symptoms worsen or fail to improve.  Park Meo, FNP

## 2023-12-24 ENCOUNTER — Encounter: Payer: Self-pay | Admitting: Family Medicine

## 2023-12-25 ENCOUNTER — Other Ambulatory Visit: Payer: Self-pay | Admitting: Family Medicine

## 2023-12-25 ENCOUNTER — Ambulatory Visit: Payer: Medicare Other | Attending: Internal Medicine | Admitting: *Deleted

## 2023-12-25 DIAGNOSIS — I4891 Unspecified atrial fibrillation: Secondary | ICD-10-CM | POA: Insufficient documentation

## 2023-12-25 DIAGNOSIS — Z5181 Encounter for therapeutic drug level monitoring: Secondary | ICD-10-CM | POA: Diagnosis not present

## 2023-12-25 LAB — POCT INR: INR: 4 — AB (ref 2.0–3.0)

## 2023-12-25 MED ORDER — PREDNISONE 20 MG PO TABS: ORAL_TABLET | ORAL | 0 refills | Status: DC

## 2023-12-25 NOTE — Patient Instructions (Signed)
Starting prednisone today: 60mg  x 2 days, 40mg  x 2 days and 20mg  x 2 days. Hold warfarin tonight then decrease dose to 1/2 tablet daily while on prednisone.  Starting Monday 1/27 restart 1/2 tablet daily except 1 tablet on Mondays, Wednesdays and Fridays. Continue greens/salad Recheck in 1 wk

## 2024-01-02 ENCOUNTER — Ambulatory Visit: Payer: Medicare Other | Attending: Internal Medicine | Admitting: *Deleted

## 2024-01-02 DIAGNOSIS — I4891 Unspecified atrial fibrillation: Secondary | ICD-10-CM | POA: Diagnosis not present

## 2024-01-02 DIAGNOSIS — Z5181 Encounter for therapeutic drug level monitoring: Secondary | ICD-10-CM | POA: Diagnosis not present

## 2024-01-02 LAB — POCT INR: INR: 3.3 — AB (ref 2.0–3.0)

## 2024-01-02 NOTE — Patient Instructions (Signed)
Finished prednisone. Hold warfarin tonight then continue 1/2 tablet daily except 1 tablet on Mondays, Wednesdays and Fridays. Continue greens/salad Recheck in 3 wk

## 2024-01-14 ENCOUNTER — Encounter: Payer: Self-pay | Admitting: Family Medicine

## 2024-01-14 ENCOUNTER — Other Ambulatory Visit: Payer: Self-pay

## 2024-01-14 DIAGNOSIS — M810 Age-related osteoporosis without current pathological fracture: Secondary | ICD-10-CM

## 2024-01-28 ENCOUNTER — Ambulatory Visit: Payer: Medicare Other | Attending: Internal Medicine | Admitting: *Deleted

## 2024-01-28 DIAGNOSIS — Z5181 Encounter for therapeutic drug level monitoring: Secondary | ICD-10-CM | POA: Insufficient documentation

## 2024-01-28 DIAGNOSIS — I4891 Unspecified atrial fibrillation: Secondary | ICD-10-CM | POA: Insufficient documentation

## 2024-01-28 LAB — POCT INR: INR: 2.8 (ref 2.0–3.0)

## 2024-01-28 NOTE — Patient Instructions (Signed)
 Continue warfarin 1/2 tablet daily except 1 tablet on Mondays, Wednesdays and Fridays. Continue greens/salad Recheck in 4 wk

## 2024-02-21 DIAGNOSIS — H401131 Primary open-angle glaucoma, bilateral, mild stage: Secondary | ICD-10-CM | POA: Diagnosis not present

## 2024-02-25 ENCOUNTER — Ambulatory Visit: Payer: Medicare Other | Attending: Internal Medicine | Admitting: *Deleted

## 2024-02-25 DIAGNOSIS — I4891 Unspecified atrial fibrillation: Secondary | ICD-10-CM | POA: Diagnosis not present

## 2024-02-25 DIAGNOSIS — Z5181 Encounter for therapeutic drug level monitoring: Secondary | ICD-10-CM | POA: Diagnosis not present

## 2024-02-25 LAB — POCT INR: INR: 2.6 (ref 2.0–3.0)

## 2024-02-25 NOTE — Patient Instructions (Signed)
 Continue warfarin 1/2 tablet daily except 1 tablet on Mondays, Wednesdays and Fridays. Continue greens/salad Recheck in 5 wk

## 2024-02-26 DIAGNOSIS — M2041 Other hammer toe(s) (acquired), right foot: Secondary | ICD-10-CM | POA: Diagnosis not present

## 2024-02-26 DIAGNOSIS — M2011 Hallux valgus (acquired), right foot: Secondary | ICD-10-CM | POA: Diagnosis not present

## 2024-02-26 DIAGNOSIS — L84 Corns and callosities: Secondary | ICD-10-CM | POA: Diagnosis not present

## 2024-02-26 DIAGNOSIS — M79671 Pain in right foot: Secondary | ICD-10-CM | POA: Diagnosis not present

## 2024-02-27 ENCOUNTER — Ambulatory Visit (HOSPITAL_COMMUNITY)
Admission: RE | Admit: 2024-02-27 | Discharge: 2024-02-27 | Disposition: A | Discharge status: Home / Self Care | Payer: Medicare Other | Source: Ambulatory Visit | Attending: Family Medicine | Admitting: Family Medicine

## 2024-02-27 DIAGNOSIS — M810 Age-related osteoporosis without current pathological fracture: Secondary | ICD-10-CM | POA: Insufficient documentation

## 2024-02-27 DIAGNOSIS — Z780 Asymptomatic menopausal state: Secondary | ICD-10-CM | POA: Diagnosis not present

## 2024-03-19 ENCOUNTER — Other Ambulatory Visit: Payer: Self-pay

## 2024-03-19 DIAGNOSIS — M810 Age-related osteoporosis without current pathological fracture: Secondary | ICD-10-CM

## 2024-03-19 MED ORDER — DENOSUMAB 60 MG/ML ~~LOC~~ SOSY
60.0000 mg | PREFILLED_SYRINGE | Freq: Once | SUBCUTANEOUS | 0 refills | Status: AC
Start: 2024-03-19 — End: 2024-03-19

## 2024-03-26 DIAGNOSIS — M810 Age-related osteoporosis without current pathological fracture: Secondary | ICD-10-CM | POA: Insufficient documentation

## 2024-03-31 ENCOUNTER — Ambulatory Visit: Attending: Internal Medicine | Admitting: *Deleted

## 2024-03-31 DIAGNOSIS — Z5181 Encounter for therapeutic drug level monitoring: Secondary | ICD-10-CM | POA: Diagnosis not present

## 2024-03-31 DIAGNOSIS — I4891 Unspecified atrial fibrillation: Secondary | ICD-10-CM | POA: Diagnosis not present

## 2024-03-31 LAB — POCT INR: INR: 2.2 (ref 2.0–3.0)

## 2024-03-31 NOTE — Patient Instructions (Signed)
 Continue warfarin 1/2 tablet daily except 1 tablet on Mondays, Wednesdays and Fridays. Continue greens/salad Recheck in 6 wk

## 2024-04-09 ENCOUNTER — Ambulatory Visit (INDEPENDENT_AMBULATORY_CARE_PROVIDER_SITE_OTHER)

## 2024-04-09 DIAGNOSIS — M810 Age-related osteoporosis without current pathological fracture: Secondary | ICD-10-CM

## 2024-04-09 DIAGNOSIS — Z719 Counseling, unspecified: Secondary | ICD-10-CM

## 2024-04-09 MED ORDER — DENOSUMAB 60 MG/ML ~~LOC~~ SOSY
60.0000 mg | PREFILLED_SYRINGE | Freq: Once | SUBCUTANEOUS | Status: AC
  Administered 2024-04-09: 60 mg via SUBCUTANEOUS

## 2024-04-09 NOTE — Progress Notes (Signed)
 Patient is in office today for a nurse visit for  PROLIA  INJECTION . Patient Injection was given in the  Left arm. Patient tolerated injection well. LOT # [id number] EXP: 06/02/2026

## 2024-04-14 ENCOUNTER — Telehealth: Payer: Self-pay | Admitting: Internal Medicine

## 2024-04-14 DIAGNOSIS — R0602 Shortness of breath: Secondary | ICD-10-CM

## 2024-04-14 DIAGNOSIS — Z79899 Other long term (current) drug therapy: Secondary | ICD-10-CM

## 2024-04-14 NOTE — Telephone Encounter (Signed)
 Pt c/o swelling: STAT is pt has developed SOB within 24 hours  How much weight have you gained and in what time span?  No weight gain. Daughter says if anything, patient has lost weight   If swelling, where is the swelling located?  Feet and ankles  Are you currently taking a fluid pill?  Yes, Furosemide  20 MG. Daughter says she takes 1.5 tablets daily.  Are you currently SOB?  Not currently with patient, but she has been SOB with exertion occasionally.   Do you have a log of your daily weights (if so, list)?   Have you gained 3 pounds in a day or 5 pounds in a week?   Have you traveled recently?  Had a 4 hour trip in car about 3 weeks ago. Daughter recommended stopping every hour, but she didn't. Daughter thinks that's what first triggered edema.

## 2024-04-14 NOTE — Telephone Encounter (Signed)
 Spoke with patient's daughter Benn Brash (OK per DPR). She reports she noticed over the past week patient's feet and ankles have been swollen. She believes this began about 3 weeks ago after a 4 hour trip without stops (per patient's request).  Tammy also reports patient ate out yesterday for Mother's Day and is aware that excess salt does not help with swelling.  Tammy also would like to know if it is okay for patient to take Lasix  30 mg (1.5 tabs of 20 mg tablet) daily. Patient took this for the last 2 days. She has been taking 20 mg daily regularly.   Tammy also reports patient has been having SOB with exertion occasionally.  Patient has appt with Dr. Maximo Spar on 8/1, no sooner appts available at this time.  Will forward to Dr. Maximo Spar to review and advise.

## 2024-04-15 MED ORDER — FUROSEMIDE 40 MG PO TABS: 40.0000 mg | ORAL_TABLET | Freq: Every day | ORAL | 3 refills | Status: AC

## 2024-04-15 NOTE — Telephone Encounter (Signed)
 Lasix  Message [phone] From Marlon Simpson, RN To Maricia, [place] and Delivered 04/15/2024  8:16 AM  Last Read in MyChart 04/15/2024  8:19 AM by Gwynn Lesches   Patient viewed MyChart message

## 2024-04-15 NOTE — Addendum Note (Signed)
 Addended by: Haru Shaff L on: 04/15/2024 10:42 AM   Modules accepted: Orders

## 2024-04-15 NOTE — Telephone Encounter (Signed)
 Kathleen Lites, MD  [facility] Triage10 minutes ago (7:58 AM)    She can increase to 40 mg daily if she would like.  Dr. Marvina Slough   Left message with call back number. Will send a mychart message as well

## 2024-04-15 NOTE — Telephone Encounter (Signed)
 Hazle Lites, MD  [facility] Triage53 minutes ago (9:33 AM)    Stay on 40 mg daily - check BMET in 1 week, can decide on potassium at that time.  Dr Nanine Babcock with pt's daughter- ID X [id number]- She is aware that the patient needs to get lab work in one week. She can go to any [facility]. Also RX sent to pt's preferred pharmacy for Furosemide  40 mg.  Verbalized understanding of all information.

## 2024-04-22 DIAGNOSIS — Z79899 Other long term (current) drug therapy: Secondary | ICD-10-CM | POA: Diagnosis not present

## 2024-04-22 DIAGNOSIS — R0602 Shortness of breath: Secondary | ICD-10-CM | POA: Diagnosis not present

## 2024-04-23 LAB — BASIC METABOLIC PANEL WITH GFR
BUN/Creatinine Ratio: 32 — ABNORMAL HIGH (ref 12–28)
BUN: 24 mg/dL (ref 8–27)
CO2: 24 mmol/L (ref 20–29)
Calcium: 9.4 mg/dL (ref 8.7–10.3)
Chloride: 98 mmol/L (ref 96–106)
Creatinine, Ser: 0.75 mg/dL (ref 0.57–1.00)
Glucose: 96 mg/dL (ref 70–99)
Potassium: 4 mmol/L (ref 3.5–5.2)
Sodium: 138 mmol/L (ref 134–144)
eGFR: 78 mL/min/{1.73_m2} (ref >59)

## 2024-04-25 ENCOUNTER — Ambulatory Visit (HOSPITAL_BASED_OUTPATIENT_CLINIC_OR_DEPARTMENT_OTHER): Payer: Self-pay | Admitting: Internal Medicine

## 2024-05-07 ENCOUNTER — Telehealth: Payer: Self-pay | Admitting: Internal Medicine

## 2024-05-07 NOTE — Telephone Encounter (Signed)
 Spoke with EC- she reports that her mother's shortness of breath is worse again. She reports that her mother is supposed to be taking Furosemide  40 mg daily, but her mother said that it made her feel bad, so she went back down to 20 mg on her own.  Instructed that the patient should be taking 40 mg daily. She needs to make sure she is drinking enough water while taking this medication. It can cause dehydration, which will cause her to feel bad.  She verbalized understanding of all information and will give her mother all of this information. She has an appt 05/19/24 with APP.

## 2024-05-07 NOTE — Telephone Encounter (Signed)
 Pt c/o Shortness Of Breath: STAT if SOB developed within the last 24 hours or pt is noticeably SOB on the phone  1. Are you currently SOB (can you hear that pt is SOB on the phone)? no  2. How long have you been experiencing SOB? Over time it has gotten worse  3. Are you SOB when sitting or when up moving around? Moving around  4. Are you currently experiencing any other symptoms? No  Patient has appt on 6/16

## 2024-05-12 ENCOUNTER — Ambulatory Visit: Attending: Internal Medicine | Admitting: *Deleted

## 2024-05-12 ENCOUNTER — Other Ambulatory Visit: Payer: Self-pay | Admitting: Family Medicine

## 2024-05-12 DIAGNOSIS — Z5181 Encounter for therapeutic drug level monitoring: Secondary | ICD-10-CM | POA: Diagnosis not present

## 2024-05-12 DIAGNOSIS — I4891 Unspecified atrial fibrillation: Secondary | ICD-10-CM | POA: Insufficient documentation

## 2024-05-12 LAB — POCT INR: INR: 2.6 (ref 2.0–3.0)

## 2024-05-12 NOTE — Patient Instructions (Signed)
 Continue warfarin 1/2 tablet daily except 1 tablet on Mondays, Wednesdays and Fridays. Continue greens/salad Recheck in 6 wk

## 2024-05-18 NOTE — Progress Notes (Unsigned)
 Cardiology Office Note:   Date:  05/19/2024  ID:  Kathleen Mcconnell, DOB 1941/02/27, MRN [id number] PCP: Austine Lefort, MD  [facility] Providers Cardiologist:  None    History of Present Illness:     Discussed the use of AI scribe software for clinical note transcription with the patient, who gave verbal consent to proceed.  History of Present Illness Kathleen Mcconnell is a 83 y.o. female with history of permanent atrial fibrillation, sclerotic aortic valve, moderate to severe TR, moderate to severe MR, chronic dyspnea, TIA, hypertension.   Patient last seen 06/08/23 by Dr. Maximo Spar. At that time, overall felt to be doing well with permanent afib, stable volume.   Per chart review, patient noted with increased swelling of feet and ankles at the end of April. Lasix  increased to 40mg  daily per [employer]. A repeat MyChart message from patient on 6/4 reported worsening shortness of breath. Per notes, at some point in the past few weeks, patient decreased Lasix  back to 20mg  daily due to feeling poorly on higher dose. Presents for follow up today, is accompanied by her daughter.  Over the past few months, she has experienced worsening shortness of breath and leg swelling.  She has been dealing with edema, primarily in her left leg, which worsened after a four-hour car trip in early April. The swelling improved with an increase in her diuretic dose to 40 mg, but she returned to 20 mg after a week due to fatigue. The swelling and shortness of breath worsened again, prompting a return to 40 mg, which has since stabilized her symptoms. She experiences cramps in her left leg but denies any pain. The swelling is more pronounced on the left side.  Her weight has been monitored daily, showing a trend towards stability at around 100 pounds. She measures her weight first thing in the morning after urination, without consuming any fluids beforehand.  She has a history of atrial fibrillation and is  on warfarin. Her current medications include 40 mg of Lasix , 240 mg of diltiazem , 20 mg of lisinopril , and 50 mg of metoprolol  succinate. Her blood pressure has been stable on this regimen.     Today patient denies chest pain, shortness of breath, lower extremity edema, fatigue, palpitations, melena, hematuria, hemoptysis, diaphoresis, weakness, presyncope, syncope, orthopnea, and PND.   Studies Reviewed:    EKG:   EKG Interpretation Date/Time:  Monday May 19 2024 13:56:29 EDT Ventricular Rate:  52 PR Interval:    QRS Duration:  84 QT Interval:  420 QTC Calculation: 390 R Axis:   80  Text Interpretation: Atrial fibrillation with slow ventricular response Biventricular hypertrophy Septal infarct , age undetermined When compared with ECG of 08-Jun-2023 16:02, T wave amplitude has increased in Anterior leads Confirmed by Leala Prince [phone]) on 05/19/2024 2:11:25 PM    06/27/22 TTE  IMPRESSIONS     1. Left ventricular ejection fraction, by estimation, is 70 to 75%. The  left ventricle has hyperdynamic function. The left ventricle has no  regional wall motion abnormalities. There is mild left ventricular  hypertrophy. Left ventricular diastolic  function could not be evaluated.   2. Right ventricular systolic function is normal. The right ventricular  size is normal. There is moderately elevated pulmonary artery systolic  pressure. The estimated right ventricular systolic pressure is 52.9 mmHg.   3. Left atrial size was severely dilated.   4. Right atrial size was severely dilated.   5. The mitral valve is abnormal. Moderate  to severe mitral valve  regurgitation.   6. Tricuspid valve regurgitation is moderate to severe.   7. The aortic valve is tricuspid. Aortic valve regurgitation is not  visualized.   8. The inferior vena cava is dilated in size with >50% respiratory  variability, suggesting right atrial pressure of 8 mmHg.   Comparison(s): Changes from prior study are  noted. 11/10/2020: LVEF 65-70%,  mild LVH, severe biatrial enlargment, moderate MR, severe TR.   FINDINGS   Left Ventricle: Left ventricular ejection fraction, by estimation, is 70  to 75%. The left ventricle has hyperdynamic function. The left ventricle  has no regional wall motion abnormalities. The left ventricular internal  cavity size was normal in size.  There is mild left ventricular hypertrophy. Left ventricular diastolic  function could not be evaluated due to atrial fibrillation. Left  ventricular diastolic function could not be evaluated.   Right Ventricle: The right ventricular size is normal. No increase in  right ventricular wall thickness. Right ventricular systolic function is  normal. There is moderately elevated pulmonary artery systolic pressure.  The tricuspid regurgitant velocity is  3.35 m/s, and with an assumed right atrial pressure of 8 mmHg, the  estimated right ventricular systolic pressure is 52.9 mmHg.   Left Atrium: Left atrial size was severely dilated.   Right Atrium: Right atrial size was severely dilated.   Pericardium: There is no evidence of pericardial effusion.   Mitral Valve: The mitral valve is abnormal. There is mild thickening of  the anterior and posterior mitral valve leaflet(s). Moderate to severe  mitral valve regurgitation.   Tricuspid Valve: The tricuspid valve is grossly normal. Tricuspid valve  regurgitation is moderate to severe.   Aortic Valve: The aortic valve is tricuspid. Aortic valve regurgitation is  not visualized.   Pulmonic Valve: The pulmonic valve was grossly normal. Pulmonic valve  regurgitation is trivial.   Aorta: The aortic root and ascending aorta are structurally normal, with  no evidence of dilitation.   Venous: The inferior vena cava is dilated in size with greater than 50%  respiratory variability, suggesting right atrial pressure of 8 mmHg.   IAS/Shunts: No atrial level shunt detected by color flow  Doppler.   Risk Assessment/Calculations:    CHA2DS2-VASc Score = 7   This indicates a 11.2% annual risk of stroke. The patient's score is based upon: CHF History: 1 HTN History: 1 Diabetes History: 0 Stroke History: 2 Vascular Disease History: 0 Age Score: 2 Gender Score: 1             Physical Exam:   VS:  BP 132/80 (BP Location: Right Arm, Patient Position: Sitting, Cuff Size: Normal)   Pulse 67   Ht 5' 3 (1.6 m)   Wt 104 lb (47.2 kg)   BMI 18.42 kg/m    Wt Readings from Last 3 Encounters:  05/19/24 104 lb (47.2 kg)  12/20/23 100 lb 12.8 oz (45.7 kg)  12/11/23 105 lb 6.4 oz (47.8 kg)     Physical Exam Vitals reviewed.  Constitutional:      Appearance: Normal appearance.  HENT:     Head: Normocephalic.     Nose: Nose normal.   Eyes:     Pupils: Pupils are equal, round, and reactive to light.    Cardiovascular:     Rate and Rhythm: Normal rate and regular rhythm.     Pulses: Normal pulses.     Heart sounds: Murmur (3/6 systolic murmur over apex) heard.  No friction rub. No gallop.  Pulmonary:     Effort: Pulmonary effort is normal.     Breath sounds: Normal breath sounds.  Abdominal:     General: Abdomen is flat.   Musculoskeletal:     Right lower leg: No edema.     Left lower leg: Edema (2+ pitting edema to mid shin) present.   Skin:    General: Skin is warm and dry.     Capillary Refill: Capillary refill takes less than 2 seconds.   Neurological:     General: No focal deficit present.     Mental Status: She is alert and oriented to person, place, and time.   Psychiatric:        Mood and Affect: Mood normal.        Behavior: Behavior normal.        Thought Content: Thought content normal.        Judgment: Judgment normal.     ASSESSMENT AND PLAN:    Assessment & Plan Valvular heart disease HFpEF Hypertension Chronic valvular heart disease with moderate to severe tricuspid and mitral valve regurgitation. Recent dyspnea and LE edema  exacerbation may be due to worsening valvular dysfunction. Previous echocardiogram in 2023 indicated valve insufficiency, suspected to have progressed. Symptoms improved with increased Lasix  dosage.  - Order echocardiogram to assess current valvular function - Continue Lasix  40 mg daily  Edema Intermittent edema, more pronounced in the left leg, possibly exacerbated by recent travel. Improved with increased Lasix  dose. Differential includes DVT, especially given unilateral presentation and recent long car ride. An ultrasound of the left leg is warranted to rule out DVT (though unlikely on warfarin).  - Order ultrasound of the left leg to rule out DVT - Continue Lasix  40 mg daily  Atrial fibrillation Chronic atrial fibrillation, well-managed. No changes in EKG or symptoms during this visit. Managed with metoprolol  succinate and warfarin.  - Continue metoprolol  succinate 50 mg daily - Continue Cardizem  CD 240mg  daily - Continue warfarin as prescribed  Hypertension BP stable today. - Continue Lisinopril  20mg  daily, Toprol  XL 50mg , Cardizem  CD 240mg .   Hyperlipidemia Continue Rosuvastatin .  Goals of Care Discussion about avoiding significant cardiac surgery due to age. Focus is on maximizing medical management to control symptoms and maintain quality of life.         Signed, Leala Prince, PA-C

## 2024-05-19 ENCOUNTER — Ambulatory Visit: Attending: Cardiology | Admitting: Cardiology

## 2024-05-19 VITALS — BP 132/80 | HR 67 | Ht 63.0 in | Wt 104.0 lb

## 2024-05-19 DIAGNOSIS — I5032 Chronic diastolic (congestive) heart failure: Secondary | ICD-10-CM | POA: Insufficient documentation

## 2024-05-19 DIAGNOSIS — I361 Nonrheumatic tricuspid (valve) insufficiency: Secondary | ICD-10-CM | POA: Diagnosis not present

## 2024-05-19 DIAGNOSIS — I340 Nonrheumatic mitral (valve) insufficiency: Secondary | ICD-10-CM | POA: Diagnosis not present

## 2024-05-19 DIAGNOSIS — I4891 Unspecified atrial fibrillation: Secondary | ICD-10-CM | POA: Diagnosis not present

## 2024-05-19 DIAGNOSIS — R0602 Shortness of breath: Secondary | ICD-10-CM | POA: Diagnosis not present

## 2024-05-19 NOTE — Patient Instructions (Addendum)
 Medication Instructions:  No medication changes were made at this visit. Continue current regimen.   *If you need a refill on your cardiac medications before your next appointment, please call your pharmacy*  Lab Work: None ordered today. If you have labs (blood work) drawn today and your tests are completely normal, you will receive your results only by: MyChart Message (if you have MyChart) OR A paper copy in the mail If you have any lab test that is abnormal or we need to change your treatment, we will call you to review the results.  Testing/Procedures: Your provider has requested that you have a lower extremity DVT work up.  Your physician has requested that you have an echocardiogram. Echocardiography is a painless test that uses sound waves to create images of your heart. It provides your doctor with information about the size and shape of your heart and how well your heart's chambers and valves are working. This procedure takes approximately one hour. There are no restrictions for this procedure. Please do NOT wear cologne, perfume, aftershave, or lotions (deodorant is allowed). Please arrive 15 minutes prior to your appointment time.  Please note: We ask at that you not bring children with you during ultrasound (echo/ vascular) testing. Due to room size and safety concerns, children are not allowed in the ultrasound rooms during exams. Our front office staff cannot provide observation of children in our lobby area while testing is being conducted. An adult accompanying a patient to their appointment will only be allowed in the ultrasound room at the discretion of the ultrasound technician under special circumstances. We apologize for any inconvenience.   Follow-Up: At [facility], you and your health needs are our priority.  As part of our continuing mission to provide you with exceptional heart care, our providers are all part of one team.  This team includes your primary  Cardiologist (physician) and Advanced Practice Providers or APPs (Physician Assistants and Nurse Practitioners) who all work together to provide you with the care you need, when you need it.  Your next appointment:   07/04/24   Provider:   Dr. Maximo Spar

## 2024-05-22 ENCOUNTER — Other Ambulatory Visit: Payer: Self-pay | Admitting: Internal Medicine

## 2024-05-22 MED ORDER — DILTIAZEM HCL ER COATED BEADS 240 MG PO CP24: 240.0000 mg | ORAL_CAPSULE | Freq: Every day | ORAL | 1 refills | Status: DC

## 2024-05-26 ENCOUNTER — Other Ambulatory Visit: Payer: Self-pay | Admitting: Family Medicine

## 2024-06-04 ENCOUNTER — Ambulatory Visit: Attending: Cardiology

## 2024-06-04 DIAGNOSIS — R0602 Shortness of breath: Secondary | ICD-10-CM | POA: Insufficient documentation

## 2024-06-06 ENCOUNTER — Ambulatory Visit: Payer: Self-pay | Admitting: Cardiology

## 2024-06-20 ENCOUNTER — Ambulatory Visit (HOSPITAL_COMMUNITY)
Admission: RE | Admit: 2024-06-20 | Discharge: 2024-06-20 | Disposition: A | Discharge status: Home / Self Care | Source: Ambulatory Visit | Attending: Cardiology | Admitting: Cardiology

## 2024-06-20 DIAGNOSIS — R0602 Shortness of breath: Secondary | ICD-10-CM | POA: Diagnosis not present

## 2024-06-20 LAB — ECHOCARDIOGRAM COMPLETE
AR max vel: 1.73 cm2
AV Area VTI: 1.87 cm2
AV Area mean vel: 2.06 cm2
AV Mean grad: 1.8 mmHg
AV Peak grad: 4.8 mmHg
Ao pk vel: 1.09 m/s
Area-P 1/2: 6.96 cm2
MV M vel: 5.35 m/s
MV Peak grad: 114.5 mmHg
Radius: 0.7 cm
S' Lateral: 6.2 cm

## 2024-06-20 NOTE — Progress Notes (Signed)
*  PRELIMINARY RESULTS* Echocardiogram 2D Echocardiogram has been performed.  Kathleen Mcconnell 06/20/2024, 3:09 PM

## 2024-06-23 ENCOUNTER — Encounter

## 2024-06-30 ENCOUNTER — Ambulatory Visit: Attending: Internal Medicine | Admitting: *Deleted

## 2024-06-30 DIAGNOSIS — I4891 Unspecified atrial fibrillation: Secondary | ICD-10-CM | POA: Diagnosis not present

## 2024-06-30 DIAGNOSIS — Z5181 Encounter for therapeutic drug level monitoring: Secondary | ICD-10-CM | POA: Insufficient documentation

## 2024-06-30 LAB — POCT INR: INR: 2.7 (ref 2.0–3.0)

## 2024-06-30 NOTE — Patient Instructions (Signed)
 Continue warfarin 1/2 tablet daily except 1 tablet on Mondays, Wednesdays and Fridays. Continue greens/salad Recheck in 6 wk

## 2024-06-30 NOTE — Progress Notes (Signed)
 INR 2.7  Please see anticoagulation encounter

## 2024-07-04 ENCOUNTER — Encounter: Payer: Self-pay | Admitting: Internal Medicine

## 2024-07-04 ENCOUNTER — Ambulatory Visit: Attending: Internal Medicine | Admitting: Internal Medicine

## 2024-07-04 VITALS — BP 110/80 | HR 90 | Ht 63.5 in | Wt 102.0 lb

## 2024-07-04 DIAGNOSIS — I4821 Permanent atrial fibrillation: Secondary | ICD-10-CM | POA: Diagnosis not present

## 2024-07-04 DIAGNOSIS — I340 Nonrheumatic mitral (valve) insufficiency: Secondary | ICD-10-CM | POA: Insufficient documentation

## 2024-07-04 DIAGNOSIS — I872 Venous insufficiency (chronic) (peripheral): Secondary | ICD-10-CM | POA: Diagnosis not present

## 2024-07-04 DIAGNOSIS — I5032 Chronic diastolic (congestive) heart failure: Secondary | ICD-10-CM | POA: Diagnosis not present

## 2024-07-04 NOTE — Patient Instructions (Addendum)
 Medication Instructions:  No changes  *If you need a refill on your cardiac medications before your next appointment, please call your pharmacy*   Lab Work: Not needed    Testing/Procedures: Your physician has requested that you have a lower extremity venous duplex (  venous insufficiency - reflux ). This test is an ultrasound of the veins in the legs or arms. It looks at venous blood flow that carries blood from the heart to the legs or arms. Allow one hour for a Lower Venous exam. Allow thirty minutes for an Upper Venous exam. There are no restrictions or special instructions.  Please note: We ask at that you not bring children with you during ultrasound (echo/ vascular) testing. Due to room size and safety concerns, children are not allowed in the ultrasound rooms during exams. Our front office staff cannot provide observation of children in our lobby area while testing is being conducted. An adult accompanying a patient to their appointment will only be allowed in the ultrasound room at the discretion of the ultrasound technician under special circumstances. We apologize for any inconvenience.    Follow-Up: At [facility], you and your health needs are our priority.  As part of our continuing mission to provide you with exceptional heart care, we have created designated Provider Care Teams.  These Care Teams include your primary Cardiologist (physician) and Advanced Practice Providers (APPs -  Physician Assistants and Nurse Practitioners) who all work together to provide you with the care you need, when you need it.     Your next appointment:   6 month(s)  The format for your next appointment:   In Person  Provider:   [name], MD

## 2024-07-04 NOTE — Progress Notes (Signed)
 OFFICE NOTE  Chief Complaint: Routine follow-up  Primary Care Physician: [name], MD  HPI:  Kathleen Mcconnell is a 83 year old female with a history of paroxysmal atrial fibrillation, on sotalol, who had a cardioversion in 2004 and had not had recurrence until recently. She also has signs of rheumatic heart disease on her echocardiogram with some sclerosis of the aortic valve, and moderate to severe TR and mild MR with a flat coaptation on the mitral valve. In addition, her EF is greater than 55% and she has normal atrial sizes. Recently, she had presented to the office for an annual followup and was found to be in atrial fibrillation with controlled ventricular response. She was clearly unaware that she was in atrial fibrillation; however, she did note that she was getting a little more short of breath when walking up stairs and had been a little bit more fatigued recently. She has been maintained on warfarin and has done fairly well with maintaining a therapeutic INR. She is also on diltiazem  additionally for rate control and also for hypertension. I, therefore, recommended increasing her sotalol to 120 mg twice daily and set her up for a cardioversion. She underwent cardioversion on November 19, 2012, with a single 150-joule biphasic shock. She converted to a sinus rhythm and afterwards felt very well. This persisted for several weeks until recently; she started to feel tired again, not nearly as good as shortly after the procedure was performed. On followup visit, she was noted again to be in atrial fibrillation, now with slow ventricular response at a rate of 56. She claimed to be fairly symptomatic and therefore refer her to see Dr. [name] with cardiac electrophysiology. He evaluated her and felt that due to her severe tricuspid regurgitation, she was not a good candidate for atrial fibrillation ablation, as well as the fact that she is not symptomatic.  He also recommended discontinuing  sotalol and pursuing rate control. She has since transitioned over to her warfarin checks with [facility] in [place].  [name] returns today for follow-up. She is noted to be in A. fib with mild rapid ventricular response today and rates in the low 100s. There is a question about her being on both ACE and ARB which was started over 10 years ago. Although this is not current practice I have continued on this medicine because he seems to be doing well, however it is somewhat redundant. She denies any chest pain or worsening shortness of breath. I do believe she could have better rate control.  I saw Kathleen Mcconnell back today in the office for follow-up. Overall she's feeling well. She remains rate controlled in permanent A. fib. Her INRs have been therapeutic except for one short period this summer. Apparently she was seen in the ER for what was thought to be a TIA event. Her INR was slightly low however cardio workup was negative. She ultimately was reestablished to a therapeutic level of warfarin and has done well since then.  07/13/2016  Kathleen Mcconnell returns today for follow-up. She seems to be doing fairly well. She's had no further TIA events. She is accompanied by her friend today who reports that she has noted that Kathleen Mcconnell has had more shortness of breath recently. Particularly when walking up stairs. She's also had lower extremity swelling and some discoloration of her ankles. She is concerned about heart failure. Blood pressure is mildly elevated today however she found her dog dead this morning. Understandably her blood pressure  would be elevated. She denies any chest pain.  08/25/2016  Kathleen Mcconnell returns today for follow-up. She reports her swelling has significantly improved, however, she never got her compression stockings. Echo shows EF 55-60% with severe biatrial enlargement (maybe consistent with permanent a-fib) and mild to moderate MR and moderate TR with RVSP of 33 mmHg. Although she has been  compliant with warfarin and INR has been fairly well managed, it is likely that she will have greater benefit and lower bleeding risk on DOAC such as Eliquis . We discussed switching today. She was previously on Xarelto  before warfarin, however, thought she may have had side-effects. Turned out not to be the medication.  02/22/2017  Kathleen Mcconnell seen today in follow-up. Overall she's had improvement in swelling. She's taking her Lasix  only about 4 days a week. She reported that she felt dizzy or unwell on Eliquis . Is not quite clear but she took it for 3 months and then transition back to warfarin. Unfortunately in January this year she had a UTI and was placed on Bactrim which significantly elevated her INR to almost 15. Subsequent to that she was referred to the hospital but did not have any significant or life-threatening bleeding. INR since been fairly well-regulated and it was 3.2 on March 14. Blood pressure is well-controlled today 124/76. She reports persistent shortness of breath which comes and goes when walking up stairs, something she does on a daily basis.  05/06/2018  Kathleen Mcconnell is seen today in follow-up.  She denies any chest pain or worsening shortness of breath.  Her A. fib rate is controlled today.  Recently she is been very stable with her INR.  She says she has not had blood work possibly in the last year.    05/19/2019  Kathleen Mcconnell is seen today in follow-up.  This is a routine annual visit.  She denies any chest pain or worsening shortness of breath.  She is in persistent A. fib but is rate controlled and unaware of it.  Her INRs have been stable being checked every 4 to 6 weeks.  She is not had any recent blood work or follow-up with her PCP.  Blood pressure was slightly elevated today however reportedly is better at home.  05/13/2020  Kathleen Mcconnell returns today for follow-up.  She continues to do well without any symptoms.  She is not had any worsening shortness of breath and notes that her lower extremity  edema is essentially resolved.  She has not needed additional Lasix .  Her last echo was in 2017 which did show some mild to moderate MR and moderate TR but this has not been reassessed.  She has had therapeutic INRs.  She has not had any significant lab work except for check a TSH by her PCP.  05/24/2021  Kathleen Mcconnell returns today for follow-up.  In December she had an echocardiogram which showed worsening mitral and tricuspid regurgitation now moderate and severe respectively.  She also had elevated BNP.  She was more short of breath.  I advised her to start taking her Lasix  20 mg daily.  Since then she has had improvement in her shortness of breath and her edema.  She is maintained on this and done much better.  Weight is down a little bit.  Blood pressure is normal.  Overall she feels well.  She is in permanent A. fib with a controlled ventricular response.  11/03/2021  Kathleen Mcconnell is seen today in follow-up.  She is appropriately grieving as she lost her husband about  8 weeks ago.  She cared for him for a long time and he was quite ill.  According to her daughter she apparently has been losing some weight.  She is down about 3 pounds since the summer and says she is still eating.  She does not feel necessarily depressed but perhaps is not eating quite as much.  She denies any worsening swelling or shortness of breath.  Blood pressure is well controlled today.  EKG shows a persistent A. fib which is rate controlled.  She is due for an INR check which we may accommodate today.  05/29/2022  Kathleen Mcconnell returns today for follow-up.  She reports some ongoing shortness of breath but her daughter notes that she is actually had less swelling and seems to be doing a little bit better.  She is overdue for an echocardiogram to reassess her mitral and tricuspid insufficiencies.  Weight has been fairly stable.  Blood pressure is well controlled.  06/08/2023  Kathleen Mcconnell is seen today in follow-up.  Overall she  continues to do well.  She had a bout with bronchitis and severe coughing in May but has recovered from this.  It did seem to throw her INR is off somewhat.  She was a little bit high and now is low.  Blood pressure is well-controlled.  No significant worsening shortness of breath although she is on furosemide  20 mg daily now which seems to have helped her lower extremity edema.  Echo as reported previously has shown severe TR but this has been persistent for years.  Clinically, however she seems to be doing very well and remains active without limitations.  07/04/2024  Kathleen Mcconnell returns today for follow-up.  Recently they called in because she has had some worsening shortness of breath and lower extremity swelling.  At some point she had discontinued her diuretic.  She was then restarted on furosemide  40 mg daily by [name], PA-C.  She has had improvement in her shortness of breath but continues to have some lower extremity edema.  This is asymmetric worse in the left lower extremity than the right.  She also has signs of chronic venous insufficiency including spider veins and varicose veins worse on the left than the right as well as some hemosiderin deposition which is indicative of chronic venous disease.  She has worn compression stockings but had difficulty removing them in the past.  I suggested zip up stocking which may be easier to take off.  She had a repeat echo which showed essentially no change in her LVEF.  She also had venous Dopplers to rule out DVT.  These were negative.  PMHx:  Past Medical History:  Diagnosis Date   History of nuclear stress test 03/2012   lexiscan ; normal pattern of perfusion; normal, low risk    Hyperlipidemia    Hypertension    Hypothyroidism    Mild aortic insufficiency    Mitral valve regurgitation    Persistent atrial fibrillation (HCC)    History of; cardioversion in 2004   Severe tricuspid regurgitation    on echo 02/2011 ?rheumatic heart disease    Thyroid  mass 10/10/2001   benign lesion removed    Past Surgical History:  Procedure Laterality Date   BREAST SURGERY     CARDIAC CATHETERIZATION  12/04/2001   normal systolic function, near normal coronaries    CARDIOVERSION N/A 01/20/2013   Procedure: CARDIOVERSION;  Surgeon: Vinie KYM [name], MD;  [facility];  Service: Cardiovascular;  Laterality: N/A;  THYROIDECTOMY  10/10/2001   TRANSTHORACIC ECHOCARDIOGRAM  02/02/2011   EF=>55%; mild MR; mod-severe TR & elevated RV systolic pressure, mild pulm HTN; aortic valve mildly sclerotic with mild regurg    FAMHx:  Family History  Problem Relation Age of Onset   Heart disease Father        also MI   Heart disease Brother        x 3, one deceased age 86   Heart disease Sister     SOCHx:   reports that she has never smoked. She has never used smokeless tobacco. She reports that she does not drink alcohol and does not use drugs.  ALLERGIES:  Allergies  Allergen Reactions   Aspirin Anaphylaxis, Hives, Rash and Other (See Comments)    As well as short of breath; Previously was rushed to hospital-given epinephrine injection.     ROS: Pertinent items noted in HPI and remainder of comprehensive ROS otherwise negative.  HOME MEDS: Current Outpatient Medications  Medication Sig Dispense Refill   acetaminophen  (TYLENOL ) 500 MG tablet Take 1,000 mg by mouth every 6 (six) hours as needed. For pain     B Complex Vitamins (B COMPLEX 1 PO) Take 1 tablet by mouth daily.     CALCIUM -VITAMIN D PO Take 1 tablet by mouth 2 (two) times daily.     denosumab  (PROLIA ) 60 MG/ML SOSY injection Inject 60 mg into the skin every 6 (six) months.     diltiazem  (CARDIZEM  CD) 240 MG 24 hr capsule Take 1 capsule (240 mg total) by mouth daily. 90 capsule 1   fish oil-omega-3 fatty acids 1000 MG capsule Take 1 g by mouth daily.     furosemide  (LASIX ) 40 MG tablet Take 1 tablet (40 mg total) by mouth daily. 90 tablet 3   gabapentin  (NEURONTIN ) 100  MG capsule TAKE ONE CAPSULE BY MOUTH AT BEDTIME 30 capsule 3   latanoprost (XALATAN) 0.005 % ophthalmic solution 1 drop at bedtime.     levothyroxine  (SYNTHROID ) 88 MCG tablet Take 1 tablet (88 mcg total) by mouth daily. 90 tablet 3   lisinopril  (ZESTRIL ) 20 MG tablet TAKE (1) TABLET BY MOUTH DAILY. 90 tablet 3   metoprolol  succinate (TOPROL -XL) 50 MG 24 hr tablet TAKE ONE TABLET BY MOUTH DAILY. 90 tablet 3   Multiple Vitamin (MULTIVITAMIN WITH MINERALS) TABS Take 1 tablet by mouth daily.     rosuvastatin  (CRESTOR ) 5 MG tablet TAKE ONE TABLET BY MOUTH DAILY. 90 tablet 3   warfarin (COUMADIN ) 6 MG tablet TAKE 1/2 TABLET DAILY EXCEPT 1 TABLET ON MONDAYS, WEDNESDAYS, AND FRIDAYS. 72 tablet 3   No current facility-administered medications for this visit.    LABS/IMAGING: No results found for this or any previous visit (from the past 48 hours). No results found.  VITALS: BP 110/80   Pulse 90   Ht 5' 3.5 (1.613 m)   Wt 102 lb (46.3 kg)   SpO2 97%   BMI 17.79 kg/m   EXAM: General appearance: alert and no distress Neck: no carotid bruit, no JVD, and thyroid  not enlarged, symmetric, no tenderness/mass/nodules Lungs: clear to auscultation bilaterally Heart: irregularly irregular rhythm and systolic murmur: early systolic 2/6, blowing at apex Abdomen: soft, non-tender; bowel sounds normal; no masses,  no organomegaly Extremities: edema trace right lower extremity and 1-2+ left lower extremity, varicose veins noted, and venous stasis dermatitis noted Pulses: 2+ and symmetric Skin: Chronic bilateral lower extremity venous stasis changes Neurologic: Grossly normal Psych: Pleasant  EKG: Deferred  ASSESSMENT:  DOE-  Echo with normal LV function at 65 to 70%, severe biatrial enlargement -  moderate MR, severe TR (11/2020) Bilateral venous insufficiency, left greater than right with lower extremity edema, varicose veins and spider veins noted Permanent atrial  fibrillation-asymptomatic Hypertension-controlled Long-term anticoagulation on warfarin History of TIA  PLAN: 1.   Kathleen Mcconnell has been struggling with lower extremity edema.  This is improved somewhat including her shortness of breath with restarting on Lasix  however her edema is asymmetric.  She does probably have worsening venous insufficiency on the left lower extremity.  Recent Dopplers were negative for DVT however they were not venous insufficiency studies.  I would like to get bilateral venous insufficiency studies to see if she is possibly candidate for superficial vein ablation.  I have encouraged continued stocking wear which she has previously used for more than 90 days.  She is anticoagulated.  Will continue with furosemide  40 mg daily.  I have also encouraged elevation of her feet is much as possible.  I will follow-up on her venous Dopplers and if there is possibly something that could be ablated in the superficial venous system will refer her to vascular surgery  Follow-up otherwise in 6 months or sooner as necessary.  Vinie KYM [name], MD, [facility], FNLA, FACP  [place]  [facility]  Medical Director of the [facility] Lipid [facility] &  Cardiovascular Risk Reduction Clinic Diplomate of the American Board of Clinical Lipidology Attending Cardiologist  Direct Dial: [phone]  Fax: [phone]  Website:  [email or web address] Vinie BROCKS Zarius Furr 07/04/2024, 11:46 AM

## 2024-07-11 ENCOUNTER — Other Ambulatory Visit: Payer: Self-pay | Admitting: Internal Medicine

## 2024-07-11 DIAGNOSIS — I4891 Unspecified atrial fibrillation: Secondary | ICD-10-CM

## 2024-07-11 NOTE — Telephone Encounter (Signed)
 Prescription refill request received for warfarin Lov: 07/04/24 (Hilty)  Next INR check: 08/11/24 Warfarin tablet strength: 6mg   Appropriate dose. Refill sent.

## 2024-07-14 ENCOUNTER — Encounter (HOSPITAL_COMMUNITY)

## 2024-07-15 ENCOUNTER — Ambulatory Visit (HOSPITAL_COMMUNITY)
Admission: RE | Admit: 2024-07-15 | Discharge: 2024-07-15 | Disposition: A | Discharge status: Home / Self Care | Source: Ambulatory Visit | Attending: Internal Medicine | Admitting: Internal Medicine

## 2024-07-15 DIAGNOSIS — I872 Venous insufficiency (chronic) (peripheral): Secondary | ICD-10-CM | POA: Diagnosis not present

## 2024-07-15 DIAGNOSIS — I5032 Chronic diastolic (congestive) heart failure: Secondary | ICD-10-CM | POA: Insufficient documentation

## 2024-07-16 ENCOUNTER — Ambulatory Visit: Payer: Self-pay | Admitting: Internal Medicine

## 2024-08-01 ENCOUNTER — Telehealth: Payer: Self-pay | Admitting: *Deleted

## 2024-08-01 NOTE — Telephone Encounter (Signed)
 Received a fax from [place] that the manufacturer was changing on her warfarin. Placed a note on her next appointment which is in 10 days to ensure monitoring.

## 2024-08-11 ENCOUNTER — Ambulatory Visit: Attending: Internal Medicine | Admitting: *Deleted

## 2024-08-11 DIAGNOSIS — Z5181 Encounter for therapeutic drug level monitoring: Secondary | ICD-10-CM | POA: Diagnosis not present

## 2024-08-11 DIAGNOSIS — I4891 Unspecified atrial fibrillation: Secondary | ICD-10-CM | POA: Insufficient documentation

## 2024-08-11 LAB — POCT INR: INR: 2.7 (ref 2.0–3.0)

## 2024-08-11 NOTE — Progress Notes (Signed)
 INR 2.7; Please see anticoagulation encounter

## 2024-08-11 NOTE — Patient Instructions (Signed)
 Continue warfarin 1/2 tablet daily except 1 tablet on Mondays, Wednesdays and Fridays. Continue greens/salad Recheck in 6 wk

## 2024-08-14 ENCOUNTER — Other Ambulatory Visit: Payer: Self-pay | Admitting: Internal Medicine

## 2024-08-21 DIAGNOSIS — L820 Inflamed seborrheic keratosis: Secondary | ICD-10-CM | POA: Diagnosis not present

## 2024-08-21 DIAGNOSIS — D1801 Hemangioma of skin and subcutaneous tissue: Secondary | ICD-10-CM | POA: Diagnosis not present

## 2024-08-21 DIAGNOSIS — L817 Pigmented purpuric dermatosis: Secondary | ICD-10-CM | POA: Diagnosis not present

## 2024-08-21 DIAGNOSIS — Z85820 Personal history of malignant melanoma of skin: Secondary | ICD-10-CM | POA: Diagnosis not present

## 2024-08-21 DIAGNOSIS — D2261 Melanocytic nevi of right upper limb, including shoulder: Secondary | ICD-10-CM | POA: Diagnosis not present

## 2024-08-21 DIAGNOSIS — S20461A Insect bite (nonvenomous) of right back wall of thorax, initial encounter: Secondary | ICD-10-CM | POA: Diagnosis not present

## 2024-08-21 DIAGNOSIS — Z85828 Personal history of other malignant neoplasm of skin: Secondary | ICD-10-CM | POA: Diagnosis not present

## 2024-08-21 DIAGNOSIS — L821 Other seborrheic keratosis: Secondary | ICD-10-CM | POA: Diagnosis not present

## 2024-08-21 DIAGNOSIS — L814 Other melanin hyperpigmentation: Secondary | ICD-10-CM | POA: Diagnosis not present

## 2024-08-25 DIAGNOSIS — Z961 Presence of intraocular lens: Secondary | ICD-10-CM | POA: Diagnosis not present

## 2024-08-25 DIAGNOSIS — H5000 Unspecified esotropia: Secondary | ICD-10-CM | POA: Diagnosis not present

## 2024-08-25 DIAGNOSIS — H5213 Myopia, bilateral: Secondary | ICD-10-CM | POA: Diagnosis not present

## 2024-08-25 DIAGNOSIS — H401131 Primary open-angle glaucoma, bilateral, mild stage: Secondary | ICD-10-CM | POA: Diagnosis not present

## 2024-08-25 DIAGNOSIS — H33302 Unspecified retinal break, left eye: Secondary | ICD-10-CM | POA: Diagnosis not present

## 2024-09-09 ENCOUNTER — Other Ambulatory Visit (HOSPITAL_COMMUNITY): Payer: Self-pay

## 2024-09-09 ENCOUNTER — Telehealth: Payer: Self-pay

## 2024-09-09 NOTE — Telephone Encounter (Signed)
 SABRA

## 2024-09-09 NOTE — Telephone Encounter (Signed)
 Prolia VOB initiated via [email or web address]  Next Prolia inj DUE: 12/21/23

## 2024-09-09 NOTE — Telephone Encounter (Signed)
 Pt ready for scheduling for PROLIA  on or after : 10/10/24  Option# 1: Buy/Bill (Office supplied medication)  Out-of-pocket cost due at time of clinic visit: $0  Number of injection/visits approved: ---  Primary: MEDICARE Prolia  co-insurance: 0% Admin fee co-insurance: 0%  Secondary: BCBS[place]-MEDSUP Prolia  co-insurance:  Admin fee co-insurance:   Medical Benefit Details: Date Benefits were checked: 09/09/24 Deductible: $257 Met of $257 Required/ Coinsurance: 0%/ Admin Fee: 0%  Prior Auth: N/A PA# Expiration Date:   # of doses approved: ----------------------------------------------------------------------- Option# 2- Med Obtained from pharmacy:  Pharmacy benefit: Copay $903.02 (Paid to pharmacy) Admin Fee: 0% (Pay at clinic)  Prior Auth: N/A PA# Expiration Date:   # of doses approved:   If patient wants fill through the pharmacy benefit please send prescription to: [facility], and include estimated need by date in rx notes. Pharmacy will ship medication directly to the office.  Patient NOT eligible for Prolia  Copay Card. Copay Card can make patient's cost as little as $25. Link to apply: [email or web address]://[email or web address]  ** This summary of benefits is an estimation of the patient's out-of-pocket cost. Exact cost may very based on individual plan coverage.

## 2024-09-22 ENCOUNTER — Ambulatory Visit: Attending: Cardiology | Admitting: *Deleted

## 2024-09-22 DIAGNOSIS — Z5181 Encounter for therapeutic drug level monitoring: Secondary | ICD-10-CM | POA: Diagnosis not present

## 2024-09-22 DIAGNOSIS — I4891 Unspecified atrial fibrillation: Secondary | ICD-10-CM | POA: Insufficient documentation

## 2024-09-22 LAB — POCT INR: INR: 1.9 — AB (ref 2.0–3.0)

## 2024-09-22 NOTE — Progress Notes (Signed)
 INR 1.9; Please see anticoagulation encounter

## 2024-09-22 NOTE — Patient Instructions (Signed)
 Take warfarin 1 1/2 tablets tonight then resume 1/2 tablet daily except 1 tablet on Mondays, Wednesdays and Fridays. Continue greens/salad Recheck in 6 wk

## 2024-09-24 ENCOUNTER — Ambulatory Visit

## 2024-10-01 ENCOUNTER — Ambulatory Visit (INDEPENDENT_AMBULATORY_CARE_PROVIDER_SITE_OTHER)

## 2024-10-01 DIAGNOSIS — Z23 Encounter for immunization: Secondary | ICD-10-CM | POA: Diagnosis not present

## 2024-10-01 NOTE — Progress Notes (Signed)
 Patient is in office today for a nurse visit for Immunization. Patient Injection was given in the  Left deltoid. Patient tolerated injection well.

## 2024-10-04 ENCOUNTER — Encounter: Payer: Self-pay | Admitting: Internal Medicine

## 2024-10-06 ENCOUNTER — Telehealth: Payer: Self-pay | Admitting: Internal Medicine

## 2024-10-06 MED ORDER — DILTIAZEM HCL ER COATED BEADS 240 MG PO CP24: 240.0000 mg | ORAL_CAPSULE | Freq: Every day | ORAL | 2 refills | Status: AC

## 2024-10-06 NOTE — Telephone Encounter (Signed)
*  STAT* If patient is at the pharmacy, call can be transferred to refill team.   1. Which medications need to be refilled? (please list name of each medication and dose if known) diltiazem  (CARDIZEM  CD) 240 MG 24 hr capsule   2. Which pharmacy/location (including street and city if local pharmacy) is medication to be sent to?  [facility] - [place], [place] - [address]    3. Do they need a 30 day or 90 day supply? 90

## 2024-10-06 NOTE — Telephone Encounter (Signed)
Refill has already been sent in today. 

## 2024-10-13 ENCOUNTER — Ambulatory Visit

## 2024-10-13 DIAGNOSIS — M810 Age-related osteoporosis without current pathological fracture: Secondary | ICD-10-CM | POA: Diagnosis not present

## 2024-10-13 MED ORDER — DENOSUMAB 60 MG/ML ~~LOC~~ SOSY
60.0000 mg | PREFILLED_SYRINGE | SUBCUTANEOUS | Status: AC
  Administered 2024-10-13: 60 mg via SUBCUTANEOUS

## 2024-10-13 NOTE — Progress Notes (Signed)
 Patient is in office today for a nurse visit for  Prolia . Patient Injection was given in the  Right arm. Patient tolerated injection well.

## 2024-10-17 ENCOUNTER — Telehealth: Payer: Self-pay | Admitting: Internal Medicine

## 2024-10-17 NOTE — Telephone Encounter (Signed)
 Called no answer

## 2024-10-17 NOTE — Telephone Encounter (Signed)
 Pt c/o Shortness Of Breath: STAT if SOB developed within the last 24 hours or pt is noticeably SOB on the phone  1. Are you currently SOB (can you hear that pt is SOB on the phone)? No   2. How long have you been experiencing SOB? Long term   3. Are you SOB when sitting or when up moving around? Both   4. Are you currently experiencing any other symptoms? No   Pts daughter calling to state pt has been having more frequent and severe episodes of SOB, especially with exertion. Pt not having active symptoms today, but daughter feels she may need to be seen before her February appt.

## 2024-10-17 NOTE — Telephone Encounter (Signed)
 Spoke to daughter --  appointments  schedule 11/03/24  at 2:45. Daughter is concerned  that shortness of breath  has increase. She verbalized understanding.

## 2024-10-17 NOTE — Telephone Encounter (Signed)
 Called  daughter  going into an area the does not have reception. Ask for RN to call back.

## 2024-10-20 ENCOUNTER — Other Ambulatory Visit: Payer: Self-pay | Admitting: Family Medicine

## 2024-10-21 NOTE — Telephone Encounter (Signed)
 Requested Prescriptions  Pending Prescriptions Disp Refills   levothyroxine  (SYNTHROID ) 88 MCG tablet [Pharmacy Med Name: levothyroxine  88 mcg tablet] 90 tablet 0    Sig: TAKE ONE TABLET BY MOUTH EVERY DAY     Endocrinology:  Hypothyroid Agents Passed - 10/21/2024  5:26 PM      Passed - TSH in normal range and within 360 days    TSH  Date Value Ref Range Status  12/11/2023 3.45 0.40 - 4.50 mIU/L Final         Passed - Valid encounter within last 12 months    Recent Outpatient Visits           10 months ago Bronchitis   [place] [facility] Kayla Jeoffrey RAMAN, FNP   10 months ago Chronic atrial fibrillation [facility])   [place] [facility] Duanne, Butler DASEN, MD   1 year ago Viral URI with cough   [place] [facility] Kayla Jeoffrey RAMAN, FNP   1 year ago Chronic atrial fibrillation [facility])   [place] [facility] Pickard, Butler DASEN, MD       Future Appointments             In 1 week West, Katlyn D, NP [facility] at [facility] A Dept of [employer]. [facility] [employer], [facility]   In 2 months Hilty, Vinie BROCKS, MD [facility] at [facility] A Dept of [employer] [place]. [facility] [employer], [facility]

## 2024-10-22 ENCOUNTER — Other Ambulatory Visit: Payer: Self-pay | Admitting: Family Medicine

## 2024-10-24 NOTE — Telephone Encounter (Signed)
 Requested Prescriptions  Pending Prescriptions Disp Refills   gabapentin  (NEURONTIN ) 100 MG capsule [Pharmacy Med Name: gabapentin  100 mg capsule] 30 capsule 2    Sig: TAKE ONE CAPSULE BY MOUTH AT BEDTIME     Neurology: Anticonvulsants - gabapentin  Passed - 10/24/2024  1:16 PM      Passed - Cr in normal range and within 360 days    Creat  Date Value Ref Range Status  12/11/2023 0.73 0.60 - 0.95 mg/dL Final   Creatinine, Ser  Date Value Ref Range Status  04/22/2024 0.75 0.57 - 1.00 mg/dL Final         Passed - Completed PHQ-2 or PHQ-9 in the last 360 days      Passed - Valid encounter within last 12 months    Recent Outpatient Visits           10 months ago Bronchitis   [place] [facility] Kayla Jeoffrey RAMAN, FNP   10 months ago Chronic atrial fibrillation [facility])   [place] [facility] Duanne, Butler DASEN, MD   1 year ago Viral URI with cough   [place] [facility] Kayla Jeoffrey RAMAN, FNP   1 year ago Chronic atrial fibrillation [facility])    [facility] Pickard, Butler DASEN, MD       Future Appointments             In 1 week West, Katlyn D, NP [facility] at [facility] A Dept of [employer]. [facility] [employer], [facility]   In 2 months Hilty, Vinie BROCKS, MD [facility] at [facility] A Dept of [employer] [place]. [facility] [employer], [facility]

## 2024-10-27 NOTE — Progress Notes (Signed)
 Cardiology Office Note    Date:  11/03/2024  ID:  [name], [name] [birth year], MRN [id number] PCP:  [name], MD  Cardiologist:  [name], MD  Electrophysiologist:  None   Chief Complaint: Shortness of breath   History of Present Illness: .   Kathleen Mcconnell is a 83 y.o. female with visit-pertinent history of permanent atrial fibrillation, sclerotic aortic valve, moderate to severe TR, moderate to severe MR, chronic dyspnea, TIA, hypertension.  Patient has been maintained on warfarin for anticoagulation, followed by her PCPs office.  Patient previously followed by Dr. [name] with cardiac EP, previously on sotalol however had persistent atrial fibrillation with transitioning to rate control methods.  She has been followed by Dr. [name].  On chart review in April patient noted increased swelling of feet and ankles.  Lasix  increased to 40 mg daily per [employer].  I repeat MyChart message from the patient in June reported worsening shortness of breath.  Following Lasix  was eventually decreased back 3 mg daily due to feeling poorly on higher doses.  Patient was seen in clinic on 05/19/2024 by [name], PA regarding increased shortness of breath and leg swelling.  It was noted that her swelling was primarily in the left leg which had worsened after a 4-hour car trip in early April.  Feeling improved with increase in diuretic dose but she returned to 20 mg daily after a week of fatigue.  Her swelling and shortness of breath then worsened again prompting her return to 40 mg daily which stabilized her symptoms.  Patient's heart rate and blood pressure were stable.  Patient had Doppler studies which showed no evidence of DVT.  Echocardiogram on 06/20/2024 indicated LVEF 60 to 65%, no RWMA, diastolic parameters were indeterminate, RV systolic function size was normal, mildly elevated PASP, severe biatrial enlargement, MR was eccentric and posterior, mild to moderate mitral valve  regurgitation, no evidence of stenosis, tricuspid valve regurgitation is severe, aortic valve tricuspid, aortic valve regurgitation is mild.  Patient was last seen in clinic on 07/04/2024 by Dr. [name] for follow-up.  Patient reported improvement in her shortness of breath but continued to have some lower extremity edema that is asymmetric in the left lower extremity than the right.  Today she presents for follow-up.  She reports that about 2-3 weeks ago she was having significantly increased shortness of breath, seemed to have been progressivly worsening for a few weeks prior to her daughter calling regarding increased dyspnea.  Patient reports that she would get up during the night and walk from her bed into the bathroom and back with significant shortness of breath and increased heart rate, which typically resolves after a few minutes.  Patient reports that her shortness of breath has since completely resolved and she is back to her baseline.  Patient and daughter both note that she had had her second Prolia  injection prior to her episodes of increased shortness of breath, they note that after her first injection she also had increased shortness of breath.  She has not planned to have any further Prolia  injections.  Patient reports that her lower extremity edema has been well-controlled, denies any increased orthopnea or PND.  She denies any increased palpitations aside from during her episode of increased shortness of breath.  She denies any chest pain, presyncope or syncope.  On review of home oxygen saturation and heart rate log her oxygen saturation has consistently been above 96 and her heart rate has been well-controlled.  Patient has been climbing the stairs in her house and tolerating well. ROS: .   Today she denies chest pain, shortness of breath, lower extremity edema, fatigue, palpitations, melena, hematuria, hemoptysis, diaphoresis, weakness, presyncope, syncope, orthopnea, and PND.  All other  systems are reviewed and otherwise negative. Studies Reviewed: SABRA   EKG:  EKG is ordered today, personally reviewed, demonstrating  EKG Interpretation Date/Time:  Monday November 03 2024 14:32:13 EST Ventricular Rate:  70 PR Interval:    QRS Duration:  90 QT Interval:  406 QTC Calculation: 438 R Axis:   83  Text Interpretation: Atrial fibrillation Biventricular hypertrophy with repolarization abnormality When compared with ECG of 19-May-2024 13:56, No significant change was found Confirmed by Dalary Hollar [id number]) on 11/03/2024 3:14:45 PM   CV Studies: Cardiac studies reviewed are outlined and summarized above. Otherwise please see EMR for full report. Cardiac Studies & Procedures   ______________________________________________________________________________________________   STRESS TESTS  NM MYOCAR MULTI W/SPECT W 03/15/2012   ECHOCARDIOGRAM  ECHOCARDIOGRAM COMPLETE 06/20/2024  Narrative ECHOCARDIOGRAM REPORT    Patient Name:   Kathleen Mcconnell Date of Exam: 06/20/2024 Medical Rec #:  [id number]      Height:       63.0 in Accession #:    [id number]     Weight:       104.0 lb Date of Birth:  1941-11-20     BSA:          1.464 m Patient Age:    82 years       BP:           152/88 mmHg Patient Gender: F              HR:           80 bpm. Exam Location:  [facility]  Procedure: 2D Echo, Cardiac Doppler and Color Doppler (Both Spectral and Color Flow Doppler were utilized during procedure).  Indications:    Shortness of breath [786.05.ICD-9-CM]  History:        Patient has prior history of Echocardiogram examinations, most recent 06/27/2022. TIA, TV Disease, Mitral Valve Disease and Aortic Valve Disease, Arrythmias:Atrial Fibrillation; Risk Factors:Hypertension and Dyslipidemia.  Sonographer:    Aida Pizza RCS Referring Phys: [id number] [name]  IMPRESSIONS   1. Left ventricular ejection fraction, by estimation, is 60 to 65%. The left ventricle has normal function. The  left ventricle has no regional wall motion abnormalities. Left ventricular diastolic parameters are indeterminate. 2. Right ventricular systolic function is normal. The right ventricular size is normal. There is mildly elevated pulmonary artery systolic pressure. 3. Left atrial size was severely dilated. 4. Right atrial size was severely dilated. 5. MR is eccentric and posterior, difficult to quantify. . The mitral valve is abnormal. Mild to moderate mitral valve regurgitation. No evidence of mitral stenosis. Moderate mitral annular calcification. 6. The tricuspid valve is abnormal. Tricuspid valve regurgitation is severe. 7. The aortic valve is tricuspid. Aortic valve regurgitation is mild. 8. The inferior vena cava is dilated in size with >50% respiratory variability, suggesting right atrial pressure of 8 mmHg.  FINDINGS Left Ventricle: Left ventricular ejection fraction, by estimation, is 60 to 65%. The left ventricle has normal function. The left ventricle has no regional wall motion abnormalities. The left ventricular internal cavity size was normal in size. There is no left ventricular hypertrophy. Left ventricular diastolic parameters are indeterminate.  Right Ventricle: The right ventricular size is normal. Right vetricular wall thickness was not well visualized. Right  ventricular systolic function is normal. There is mildly elevated pulmonary artery systolic pressure. The tricuspid regurgitant velocity is 2.84 m/s, and with an assumed right atrial pressure of 8 mmHg, the estimated right ventricular systolic pressure is 40.3 mmHg.  Left Atrium: Left atrial size was severely dilated.  Right Atrium: Right atrial size was severely dilated.  Pericardium: Trivial pericardial effusion is present. The pericardial effusion is circumferential.  Mitral Valve: MR is eccentric and posterior, difficult to quantify. The mitral valve is abnormal. There is moderate thickening of the mitral valve  leaflet(s). There is moderate calcification of the mitral valve leaflet(s). Moderate mitral annular calcification. Mild to moderate mitral valve regurgitation. No evidence of mitral valve stenosis.  Tricuspid Valve: The tricuspid valve is abnormal. Tricuspid valve regurgitation is severe. No evidence of tricuspid stenosis.  Aortic Valve: The aortic valve is tricuspid. Aortic valve regurgitation is mild. Aortic valve mean gradient measures 1.8 mmHg. Aortic valve peak gradient measures 4.8 mmHg. Aortic valve area, by VTI measures 1.87 cm.  Pulmonic Valve: The pulmonic valve was not well visualized. Pulmonic valve regurgitation is not visualized. No evidence of pulmonic stenosis.  Aorta: The aortic root is normal in size and structure and the ascending aorta was not well visualized.  Venous: The inferior vena cava is dilated in size with greater than 50% respiratory variability, suggesting right atrial pressure of 8 mmHg.  IAS/Shunts: No atrial level shunt detected by color flow Doppler.   LEFT VENTRICLE PLAX 2D LVIDd:         5.10 cm   Diastology LVIDs:         6.20 cm   LV e' medial:    8.81 cm/s LV PW:         1.00 cm   LV E/e' medial:  11.5 LV IVS:        1.00 cm   LV e' lateral:   11.40 cm/s LVOT diam:     1.80 cm   LV E/e' lateral: 8.9 LV SV:         32 LV SV Index:   22 LVOT Area:     2.54 cm   RIGHT VENTRICLE RV S prime:     10.80 cm/s TAPSE (M-mode): 1.3 cm  LEFT ATRIUM              Index         RIGHT ATRIUM           Index LA Vol (A2C):   338.0 ml 230.80 ml/m  RA Area:     34.30 cm LA Vol (A4C):   228.0 ml 155.69 ml/m  RA Volume:   108.00 ml 73.75 ml/m LA Biplane Vol: 285.0 ml 194.61 ml/m AORTIC VALVE AV Area (Vmax):    1.73 cm AV Area (Vmean):   2.06 cm AV Area (VTI):     1.87 cm AV Vmax:           108.98 cm/s AV Vmean:          60.115 cm/s AV VTI:            0.171 m AV Peak Grad:      4.8 mmHg AV Mean Grad:      1.8 mmHg LVOT Vmax:         74.30  cm/s LVOT Vmean:        48.700 cm/s LVOT VTI:          0.126 m LVOT/AV VTI ratio: 0.73  AORTA Ao Root diam: 3.20 cm  MITRAL VALVE                  TRICUSPID VALVE MV Area (PHT): 6.96 cm       TR Peak grad:   32.3 mmHg MV Decel Time: 109 msec       TR Vmax:        284.00 cm/s MR Peak grad:    114.5 mmHg MR Mean grad:    80.0 mmHg    SHUNTS MR Vmax:         535.00 cm/s  Systemic VTI:  0.13 m MR Vmean:        428.0 cm/s   Systemic Diam: 1.80 cm MR PISA:         3.08 cm MR PISA Eff ROA: 13 mm MR PISA Radius:  0.70 cm MV E velocity: 101.00 cm/s MV A velocity: 34.40 cm/s MV E/A ratio:  2.94  Dorn Ross MD Electronically signed by Dorn Ross MD Signature Date/Time: 06/20/2024/4:39:08 PM    Final          ______________________________________________________________________________________________       Current Reported Medications:.    Current Meds  Medication Sig   acetaminophen  (TYLENOL ) 500 MG tablet Take 1,000 mg by mouth every 6 (six) hours as needed. For pain   B Complex Vitamins (B COMPLEX 1 PO) Take 1 tablet by mouth daily.   CALCIUM -VITAMIN D PO Take 1 tablet by mouth 2 (two) times daily.   denosumab  (PROLIA ) 60 MG/ML SOSY injection Inject 60 mg into the skin every 6 (six) months.   diltiazem  (CARDIZEM  CD) 240 MG 24 hr capsule Take 1 capsule (240 mg total) by mouth daily.   fish oil-omega-3 fatty acids 1000 MG capsule Take 1 g by mouth daily.   furosemide  (LASIX ) 40 MG tablet Take 1 tablet (40 mg total) by mouth daily.   gabapentin  (NEURONTIN ) 100 MG capsule TAKE ONE CAPSULE BY MOUTH AT BEDTIME   latanoprost (XALATAN) 0.005 % ophthalmic solution 1 drop at bedtime.   levothyroxine  (SYNTHROID ) 88 MCG tablet TAKE ONE TABLET BY MOUTH EVERY DAY   lisinopril  (ZESTRIL ) 20 MG tablet TAKE (1) TABLET BY MOUTH DAILY.   metoprolol  succinate (TOPROL -XL) 50 MG 24 hr tablet TAKE ONE TABLET BY MOUTH DAILY.   Multiple Vitamin (MULTIVITAMIN WITH MINERALS) TABS Take  1 tablet by mouth daily.   rosuvastatin  (CRESTOR ) 5 MG tablet TAKE ONE TABLET BY MOUTH DAILY.   warfarin (COUMADIN ) 6 MG tablet TAKE 1/2 TABLET TO 1 TABLET BY MOUTH DAILY AS DIRECTED BY COUMADIN  CLINIC    Physical Exam:    VS:  BP 130/68   Pulse 70   Ht 5' 3.5 (1.613 m)   Wt 103 lb (46.7 kg)   SpO2 95%   BMI 17.96 kg/m    Wt Readings from Last 3 Encounters:  11/03/24 103 lb (46.7 kg)  07/04/24 102 lb (46.3 kg)  05/19/24 104 lb (47.2 kg)   GEN: Well nourished, well developed in no acute distress NECK: No JVD; No carotid bruits CARDIAC: RRR, 3/6 systolic murmur, no rubs or gallops RESPIRATORY:  Clear to auscultation without rales, wheezing or rhonchi  ABDOMEN: Soft, non-tender, non-distended EXTREMITIES:  Mild bilateral ankle edema; No acute deformity     Asessement and Plan:.    Edema/shortness of breath: Patient with intermittent edema and increased shortness of breath, previously improved with Lasix . Today she reports that last month she had significant dyspnea on exertion, after further discussion with patient and her daughter they both feel  that this was related to her Prolia  injections.  Patient reports that with her first injection she had increased shortness of breath that recurred recently after her second, notes that this spontaneously resolved.  She reports that she is now back to her baseline and her oxygen saturation has been good at home.  She denies any increased lower extremity edema, orthopnea or PND.  Encouraged patient to continue monitoring.  Check CBC and be met today.  Reviewed ED precautions.  Continue Lasix  40 mg daily.  Valvular heart disease/HFpEF/HTN: Echocardiogram on 06/20/2024 indicated LVEF 60 to 65%, no RWMA, diastolic parameters were indeterminate, RV systolic function size was normal, mildly elevated PASP, severe biatrial enlargement, MR was eccentric and posterior, mild to moderate mitral valve regurgitation, no evidence of stenosis, tricuspid valve  regurgitation is severe, aortic valve tricuspid, aortic valve regurgitation is mild. Today she reports that her shortness of breath has resolved, she is back to her baseline.  She denies any increased lower extremity edema.  She appears euvolemic and well compensated on exam.  Continue Lasix  40 mg daily.  Atrial fibrillation: Patient with chronic atrial fibrillation.  EKG shows rate well-controlled.  Patient denies any increased palpitations aside from during recent episode of increased dyspnea, has since resolved.  Continue diltiazem  240 mg daily and metoprolol  succinate 50 mg daily.  She denies any bleeding problems on Coumadin , followed by Coumadin  clinic in [place].  Hyperlipidemia: Last lipid profile on 12/11/2023 indicated total cholesterol 165, HDL 87 and LDL 55.  Continue Crestor  5 mg daily.  Hypertension: Blood pressure today 130/68.  Continue diltiazem  240 mg daily, Lasix  40 mg daily, lisinopril  20 mg daily and metoprolol  succinate 50 g daily.   Disposition: F/u with Dr. Mona in 01/2025 as scheduled.   Signed, Doron Shake D Cherilyn Sautter, NP

## 2024-11-03 ENCOUNTER — Ambulatory Visit

## 2024-11-03 ENCOUNTER — Encounter: Payer: Self-pay | Admitting: Cardiology

## 2024-11-03 ENCOUNTER — Ambulatory Visit: Attending: Cardiology | Admitting: Cardiology

## 2024-11-03 VITALS — BP 130/68 | HR 70 | Ht 63.5 in | Wt 103.0 lb

## 2024-11-03 DIAGNOSIS — I4821 Permanent atrial fibrillation: Secondary | ICD-10-CM | POA: Diagnosis present

## 2024-11-03 DIAGNOSIS — I361 Nonrheumatic tricuspid (valve) insufficiency: Secondary | ICD-10-CM | POA: Diagnosis present

## 2024-11-03 DIAGNOSIS — I340 Nonrheumatic mitral (valve) insufficiency: Secondary | ICD-10-CM | POA: Insufficient documentation

## 2024-11-03 DIAGNOSIS — I5032 Chronic diastolic (congestive) heart failure: Secondary | ICD-10-CM | POA: Diagnosis present

## 2024-11-03 DIAGNOSIS — I872 Venous insufficiency (chronic) (peripheral): Secondary | ICD-10-CM | POA: Diagnosis present

## 2024-11-03 NOTE — Patient Instructions (Signed)
 Medication Instructions:  Your physician recommends that you continue on your current medications as directed. Please refer to the Current Medication list given to you today.  *If you need a refill on your cardiac medications before your next appointment, please call your pharmacy*  Lab Work: Today: BMP, CBC If you have labs (blood work) drawn today and your tests are completely normal, you will receive your results only by: MyChart Message (if you have MyChart) OR A paper copy in the mail If you have any lab test that is abnormal or we need to change your treatment, we will call you to review the results.  Testing/Procedures: None   Follow-Up: At [facility], you and your health needs are our priority.  As part of our continuing mission to provide you with exceptional heart care, our providers are all part of one team.  This team includes your primary Cardiologist (physician) and Advanced Practice Providers or APPs (Physician Assistants and Nurse Practitioners) who all work together to provide you with the care you need, when you need it.  Your next appointment:   As scheduled    Provider:   [name], MD     Other Instructions

## 2024-11-04 ENCOUNTER — Ambulatory Visit: Payer: Self-pay | Admitting: Cardiology

## 2024-11-04 ENCOUNTER — Ambulatory Visit: Attending: Internal Medicine | Admitting: *Deleted

## 2024-11-04 DIAGNOSIS — Z5181 Encounter for therapeutic drug level monitoring: Secondary | ICD-10-CM | POA: Diagnosis not present

## 2024-11-04 DIAGNOSIS — I4891 Unspecified atrial fibrillation: Secondary | ICD-10-CM | POA: Diagnosis not present

## 2024-11-04 DIAGNOSIS — R0602 Shortness of breath: Secondary | ICD-10-CM

## 2024-11-04 LAB — CBC
Hematocrit: 45.2 % (ref 34.0–46.6)
Hemoglobin: 14.9 g/dL (ref 11.1–15.9)
MCH: 33.2 pg — ABNORMAL HIGH (ref 26.6–33.0)
MCHC: 33 g/dL (ref 31.5–35.7)
MCV: 101 fL — ABNORMAL HIGH (ref 79–97)
Platelets: 163 x10E3/uL (ref 150–450)
RBC: 4.49 x10E6/uL (ref 3.77–5.28)
RDW: 11.9 % (ref 11.7–15.4)
WBC: 6 x10E3/uL (ref 3.4–10.8)

## 2024-11-04 LAB — BASIC METABOLIC PANEL WITH GFR
BUN/Creatinine Ratio: 35 — ABNORMAL HIGH (ref 12–28)
BUN: 23 mg/dL (ref 8–27)
CO2: 26 mmol/L (ref 20–29)
Calcium: 9.6 mg/dL (ref 8.7–10.3)
Chloride: 98 mmol/L (ref 96–106)
Creatinine, Ser: 0.66 mg/dL (ref 0.57–1.00)
Glucose: 132 mg/dL — ABNORMAL HIGH (ref 70–99)
Potassium: 3.2 mmol/L — ABNORMAL LOW (ref 3.5–5.2)
Sodium: 142 mmol/L (ref 134–144)
eGFR: 86 mL/min/1.73 (ref >59)

## 2024-11-04 LAB — POCT INR: INR: 3 (ref 2.0–3.0)

## 2024-11-04 MED ORDER — POTASSIUM CHLORIDE CRYS ER 20 MEQ PO TBCR: EXTENDED_RELEASE_TABLET | ORAL | 3 refills | Status: AC

## 2024-11-04 NOTE — Telephone Encounter (Signed)
 Rx for Potassium Chloride has been sent to pharmacy for the patient.

## 2024-11-04 NOTE — Progress Notes (Signed)
 INR 3.0; Please see anticoagulation encounter

## 2024-11-04 NOTE — Patient Instructions (Signed)
 Continue warfarin 1/2 tablet daily except 1 tablet on Mondays, Wednesdays and Fridays. Continue greens/salad Recheck in 6 wk

## 2024-11-07 ENCOUNTER — Telehealth: Payer: Self-pay | Admitting: Internal Medicine

## 2024-11-07 NOTE — Telephone Encounter (Signed)
 Spoke with pt. Gave pt recommendations per West, NP. Pt stated understanding.

## 2024-11-07 NOTE — Telephone Encounter (Signed)
 Pt c/o medication issue:  1. Name of Medication:   potassium chloride  SA (KLOR-CON  M) 20 MEQ tablet    2. How are you currently taking this medication (dosage and times per day)?  For two(2) days take 40 mEq (2 tablets) by mouth then decrease to taking 20 mEq (1 tablet) by mouth daily      3. Are you having a reaction (difficulty breathing--STAT)? No  4. What is your medication issue? Pt's grand daughter Maire is requesting a callback regarding pt having some SOB from taking this medication. She has some concerns and they'd like to know if she should stop taking this medication. Pt just started it yesterday and she's supposed to take it again today but she advised her not to until she hears back from a nurse on what to do. Please advise    Pt c/o Shortness Of Breath: STAT if SOB developed within the last 24 hours or pt is noticeably SOB on the phone  1. Are you currently SOB (can you hear that pt is SOB on the phone)? Pt isn't on the phone and she's not with the pt.   2. How long have you been experiencing SOB? Started last night but she was already having some SOB which is why she had an office visit Monday.   3. Are you SOB when sitting or when up moving around? Both  4. Are you currently experiencing any other symptoms? Pt has a dry cough with it as well but she stated pt said she's not sick

## 2024-11-07 NOTE — Telephone Encounter (Signed)
 Patient states she took 40 meq of potassium yesterday as recommended. She woke up around 2:00 AM this morning to go to the bathroom and experience SOB and dry, hacking cough similar to what she had experienced before when she had her Prolia  injections.  Patient states she still has intermittent SOB, but it's a little better now than it was earlier this morning.  Patient denies any chest pain or palpitations. She states her BP/HR and oxygen sat are all good, she did not have any readings to share and just repeated they are all good.  Patient states she thinks the medication is too high of a dose for her and asked if she could just increase her potassium with eating potassium-rich foods, or if she could take just 1 tablet and see how she does with this.  Advised patient on ED precautions if SOB continues or gets worse, patient verbalized understanding.  Will forward to Katlyn West, NP to review and advise.

## 2024-11-11 ENCOUNTER — Other Ambulatory Visit: Payer: Self-pay | Admitting: Pharmacist

## 2024-11-11 DIAGNOSIS — I4891 Unspecified atrial fibrillation: Secondary | ICD-10-CM

## 2024-11-11 MED ORDER — WARFARIN SODIUM 6 MG PO TABS: ORAL_TABLET | ORAL | 0 refills | Status: AC

## 2024-11-21 LAB — BASIC METABOLIC PANEL WITH GFR
BUN/Creatinine Ratio: 26 (ref 12–28)
BUN: 19 mg/dL (ref 8–27)
CO2: 26 mmol/L (ref 20–29)
Calcium: 9.7 mg/dL (ref 8.7–10.3)
Chloride: 96 mmol/L (ref 96–106)
Creatinine, Ser: 0.72 mg/dL (ref 0.57–1.00)
Glucose: 89 mg/dL (ref 70–99)
Potassium: 4.7 mmol/L (ref 3.5–5.2)
Sodium: 138 mmol/L (ref 134–144)
eGFR: 82 mL/min/1.73

## 2024-12-16 ENCOUNTER — Ambulatory Visit: Attending: Internal Medicine | Admitting: *Deleted

## 2024-12-16 DIAGNOSIS — I4891 Unspecified atrial fibrillation: Secondary | ICD-10-CM | POA: Diagnosis present

## 2024-12-16 DIAGNOSIS — Z5181 Encounter for therapeutic drug level monitoring: Secondary | ICD-10-CM | POA: Diagnosis present

## 2024-12-16 LAB — POCT INR: INR: 2.5 (ref 2.0–3.0)

## 2024-12-16 NOTE — Patient Instructions (Signed)
 Continue warfarin 1/2 tablet daily except 1 tablet on Mondays, Wednesdays and Fridays. Continue greens/salad Recheck in 6 wk

## 2024-12-16 NOTE — Progress Notes (Signed)
 INR 2.5. Please see anticoagulation encounter

## 2025-01-16 ENCOUNTER — Ambulatory Visit: Admitting: Internal Medicine

## 2025-01-27 ENCOUNTER — Ambulatory Visit
